# Patient Record
Sex: Male | Born: 1935 | Race: White | Hispanic: No | Marital: Married | Smoking: Never smoker
Health system: Southern US, Community
[De-identification: ages and names within clinical notes are randomized; demographics above are authoritative.]

## PROBLEM LIST (undated history)

## (undated) ENCOUNTER — Emergency Department (HOSPITAL_COMMUNITY): Payer: Medicare Other | Source: Home / Self Care

## (undated) DIAGNOSIS — I10 Essential (primary) hypertension: Secondary | ICD-10-CM

## (undated) DIAGNOSIS — E785 Hyperlipidemia, unspecified: Secondary | ICD-10-CM

## (undated) DIAGNOSIS — M109 Gout, unspecified: Secondary | ICD-10-CM

## (undated) DIAGNOSIS — E039 Hypothyroidism, unspecified: Secondary | ICD-10-CM

## (undated) DIAGNOSIS — I480 Paroxysmal atrial fibrillation: Secondary | ICD-10-CM

## (undated) HISTORY — DX: Hypothyroidism, unspecified: E03.9

## (undated) HISTORY — DX: Hyperlipidemia, unspecified: E78.5

## (undated) HISTORY — DX: Paroxysmal atrial fibrillation: I48.0

## (undated) HISTORY — PX: TONSILLECTOMY AND ADENOIDECTOMY: SUR1326

## (undated) HISTORY — DX: Gout, unspecified: M10.9

## (undated) HISTORY — DX: Essential (primary) hypertension: I10

---

## 1999-08-04 ENCOUNTER — Encounter: Payer: Self-pay | Admitting: Family Medicine

## 1999-08-04 ENCOUNTER — Ambulatory Visit (HOSPITAL_COMMUNITY): Admission: RE | Admit: 1999-08-04 | Discharge: 1999-08-04 | Payer: Self-pay | Admitting: Family Medicine

## 1999-08-10 ENCOUNTER — Encounter: Admission: RE | Admit: 1999-08-10 | Discharge: 1999-10-01 | Payer: Self-pay | Admitting: Family Medicine

## 1999-09-09 ENCOUNTER — Ambulatory Visit (HOSPITAL_COMMUNITY): Admission: RE | Admit: 1999-09-09 | Discharge: 1999-09-09 | Payer: Self-pay | Admitting: Family Medicine

## 1999-09-09 ENCOUNTER — Encounter: Payer: Self-pay | Admitting: Family Medicine

## 1999-09-23 ENCOUNTER — Ambulatory Visit (HOSPITAL_COMMUNITY): Admission: RE | Admit: 1999-09-23 | Discharge: 1999-09-23 | Payer: Self-pay | Admitting: Family Medicine

## 1999-09-23 ENCOUNTER — Encounter: Payer: Self-pay | Admitting: Family Medicine

## 1999-10-07 ENCOUNTER — Encounter: Payer: Self-pay | Admitting: Family Medicine

## 1999-10-07 ENCOUNTER — Ambulatory Visit (HOSPITAL_COMMUNITY): Admission: RE | Admit: 1999-10-07 | Discharge: 1999-10-07 | Payer: Self-pay | Admitting: Family Medicine

## 2000-03-23 ENCOUNTER — Encounter: Payer: Self-pay | Admitting: Internal Medicine

## 2000-03-23 ENCOUNTER — Encounter (INDEPENDENT_AMBULATORY_CARE_PROVIDER_SITE_OTHER): Payer: Self-pay | Admitting: *Deleted

## 2000-03-23 ENCOUNTER — Encounter (INDEPENDENT_AMBULATORY_CARE_PROVIDER_SITE_OTHER): Payer: Self-pay | Admitting: Specialist

## 2000-03-23 ENCOUNTER — Other Ambulatory Visit: Admission: RE | Admit: 2000-03-23 | Discharge: 2000-03-23 | Payer: Self-pay | Admitting: Internal Medicine

## 2001-09-19 ENCOUNTER — Encounter: Payer: Self-pay | Admitting: Family Medicine

## 2001-09-19 ENCOUNTER — Encounter: Admission: RE | Admit: 2001-09-19 | Discharge: 2001-09-19 | Payer: Self-pay | Admitting: Family Medicine

## 2001-10-03 ENCOUNTER — Encounter: Payer: Self-pay | Admitting: Family Medicine

## 2001-10-03 ENCOUNTER — Encounter: Admission: RE | Admit: 2001-10-03 | Discharge: 2001-10-03 | Payer: Self-pay | Admitting: Family Medicine

## 2003-11-26 ENCOUNTER — Ambulatory Visit: Payer: Self-pay | Admitting: Internal Medicine

## 2003-12-10 ENCOUNTER — Ambulatory Visit: Payer: Self-pay | Admitting: Internal Medicine

## 2007-05-30 ENCOUNTER — Emergency Department (HOSPITAL_COMMUNITY): Admission: EM | Admit: 2007-05-30 | Discharge: 2007-05-30 | Payer: Self-pay | Admitting: Emergency Medicine

## 2008-10-25 ENCOUNTER — Encounter (INDEPENDENT_AMBULATORY_CARE_PROVIDER_SITE_OTHER): Payer: Self-pay | Admitting: *Deleted

## 2009-01-21 ENCOUNTER — Encounter (INDEPENDENT_AMBULATORY_CARE_PROVIDER_SITE_OTHER): Payer: Self-pay | Admitting: *Deleted

## 2009-01-22 ENCOUNTER — Ambulatory Visit: Payer: Self-pay | Admitting: Internal Medicine

## 2009-02-10 ENCOUNTER — Ambulatory Visit: Payer: Self-pay | Admitting: Internal Medicine

## 2009-02-11 ENCOUNTER — Encounter: Payer: Self-pay | Admitting: Internal Medicine

## 2009-10-09 ENCOUNTER — Ambulatory Visit: Payer: Self-pay | Admitting: Cardiology

## 2009-10-09 ENCOUNTER — Encounter: Payer: Self-pay | Admitting: Nurse Practitioner

## 2009-10-30 ENCOUNTER — Encounter: Payer: Self-pay | Admitting: Internal Medicine

## 2009-11-13 ENCOUNTER — Encounter: Payer: Self-pay | Admitting: Nurse Practitioner

## 2009-12-11 ENCOUNTER — Encounter: Payer: Self-pay | Admitting: Internal Medicine

## 2009-12-11 ENCOUNTER — Telehealth: Payer: Self-pay | Admitting: Internal Medicine

## 2009-12-12 ENCOUNTER — Ambulatory Visit: Payer: Self-pay | Admitting: Gastroenterology

## 2009-12-12 DIAGNOSIS — R197 Diarrhea, unspecified: Secondary | ICD-10-CM

## 2009-12-12 DIAGNOSIS — Z860100 Personal history of colon polyps, unspecified: Secondary | ICD-10-CM | POA: Insufficient documentation

## 2009-12-12 DIAGNOSIS — I4891 Unspecified atrial fibrillation: Secondary | ICD-10-CM

## 2009-12-16 ENCOUNTER — Encounter: Payer: Self-pay | Admitting: Nurse Practitioner

## 2010-01-08 ENCOUNTER — Ambulatory Visit: Payer: Self-pay | Admitting: Internal Medicine

## 2010-02-10 NOTE — Progress Notes (Signed)
Summary: Triage / diarrhea  Phone Note From Other Clinic   Caller: Germaine @ Dr. Carolynne Edouard [phone] Call For: Dr. Marina Goodell Summary of Call: pt. had COL done and had diarrhea ever since. Requesting pt. be seen before next avail. Initial call taken by: Karna Christmas,  December 11, 2009 10:44 AM  Follow-up for Phone Call        Patient scheduled to see Willette Cluster, RNP tomorrow 12/12/09 at 10am. Appointment scheduled with Germaine at Dr. Billey Chang office. Selinda Michaels RN  December 11, 2009 11:54 AM Follow-up by: Selinda Michaels RN,  December 11, 2009 11:54 AM

## 2010-02-10 NOTE — Letter (Signed)
Summary: Patient Notice- Polyp Results  [facility]  [address] [place], [place] [address]   Phone: [phone]  Fax: [phone]        February 11, 2009 MRN: [id number]    THEODOR MUSTIN [address] [place], [place]  [address]    Dear Mr. Gossen,  I am pleased to inform you that the colon polyp(s) removed during your recent colonoscopy was (were) found to be benign (no cancer detected) upon pathologic examination.  I recommend you have a repeat colonoscopy examination in 5 years to look for recurrent polyps, as having colon polyps increases your risk for having recurrent polyps or even colon cancer in the future.  Should you develop new or worsening symptoms of abdominal pain, bowel habit changes or bleeding from the rectum or bowels, please schedule an evaluation with either your primary care physician or with me.  Additional information/recommendations:  __ No further action with gastroenterology is needed at this time. Please      follow-up with your primary care physician for your other healthcare      needs.  Please call us if you are having persistent problems or have questions about your condition that have not been fully answered at this time.  Sincerely,  Hilarie Fredrickson MD  This letter has been electronically signed by your physician.  Appended Document: Patient Notice- Polyp Results letter mailed 2.3.11

## 2010-02-10 NOTE — Letter (Signed)
Summary: [facility] Instructions  [facility]  [address] [place], [place] [address]   Phone: [phone]  Fax: [phone]       Jerry [name]    April 15, 1931    MRN: [id number]        Procedure Day /Date:  Monday   02/10/09     Arrival Time:  10:00am     Procedure Time:  11:00am     Location of Procedure:                    [facility] _  [facility] (4th Floor)                        PREPARATION FOR COLONOSCOPY WITH MOVIPREP   Starting 5 days prior to your procedure   Wed. 01/26  do not eat nuts, seeds, popcorn, corn, beans, peas,  salads, or any raw vegetables.  Do not take any fiber supplements (e.g. Metamucil, Citrucel, and Benefiber).  THE DAY BEFORE YOUR PROCEDURE         DATE:  01/30   DAY:  Sunday  1.  Drink clear liquids the entire day-NO SOLID FOOD  2.  Do not drink anything colored red or purple.  Avoid juices with pulp.  No orange juice.  3.  Drink at least 64 oz. (8 glasses) of fluid/clear liquids during the day to prevent dehydration and help the prep work efficiently.  CLEAR LIQUIDS INCLUDE: Water Jello Ice Popsicles Tea (sugar ok, no milk/cream) Powdered fruit flavored drinks Coffee (sugar ok, no milk/cream) Gatorade Juice: apple, white grape, white cranberry  Lemonade Clear bullion, consomm, broth Carbonated beverages (any kind) Strained chicken noodle soup Hard Candy                             4.  In the morning, mix first dose of MoviPrep solution:    Empty 1 Pouch A and 1 Pouch B into the disposable container    Add lukewarm drinking water to the top line of the container. Mix to dissolve    Refrigerate (mixed solution should be used within 24 hrs)  5.  Begin drinking the prep at 5:00 p.m. The MoviPrep container is divided by 4 marks.   Every 15 minutes drink the solution down to the next mark (approximately 8 oz) until the full liter is complete.   6.  Follow completed prep with 16 oz of clear liquid of your  choice (Nothing red or purple).  Continue to drink clear liquids until bedtime.  7.  Before going to bed, mix second dose of MoviPrep solution:    Empty 1 Pouch A and 1 Pouch B into the disposable container    Add lukewarm drinking water to the top line of the container. Mix to dissolve    Refrigerate  THE DAY OF YOUR PROCEDURE      DATE:  01/31  DAY:  Monday  Beginning at  6:00 a.m. (5 hours before procedure):         1. Every 15 minutes, drink the solution down to the next mark (approx 8 oz) until the full liter is complete.  2. Follow completed prep with 16 oz. of clear liquid of your choice.    3. You may drink clear liquids until  9:00am  (2 HOURS BEFORE PROCEDURE).   MEDICATION INSTRUCTIONS  Unless otherwise instructed, you should take regular prescription medications  with a small sip of water   as early as possible the morning of your procedure.   Additional medication instructions: Hold Lisinopril the morning of procedure.         OTHER INSTRUCTIONS  You will need a responsible adult at least 75 years of age to accompany you and drive you home.   This person must remain in the waiting room during your procedure.  Wear loose fitting clothing that is easily removed.  Leave jewelry and other valuables at home.  However, you may wish to bring a book to read or  an iPod/MP3 player to listen to music as you wait for your procedure to start.  Remove all body piercing jewelry and leave at home.  Total time from sign-in until discharge is approximately 2-3 hours.  You should go home directly after your procedure and rest.  You can resume normal activities the  day after your procedure.  The day of your procedure you should not:   Drive   Make legal decisions   Operate machinery   Drink alcohol   Return to work  You will receive specific instructions about eating, activities and medications before you leave.    The above instructions have been reviewed  and explained to me by   Wyona Almas RN  January 22, 2009 10:55 AM     I fully understand and can verbalize these instructions _____________________________ Date _________

## 2010-02-10 NOTE — Procedures (Signed)
Summary: Colonoscopy  Patient: Jerry [name] Note: All result statuses are Final unless otherwise noted.  Tests: (1) Colonoscopy (COL)   COL Colonoscopy           DONE     [facility]     [address]     [place], [place]  [address]           COLONOSCOPY PROCEDURE REPORT           PATIENT:  [name], Jerry  MR#:  [id number]     BIRTHDATE:  Nov 12, 1931, 77 yrs. old  GENDER:  male           ENDOSCOPIST:  Wilhemina Bonito. Eda Keys, MD     Referred by:  Surveillance Program Recall,           PROCEDURE DATE:  02/10/2009     PROCEDURE:  Colonoscopy with snare polypectomy x 2     ASA CLASS:  Class II     INDICATIONS:  history of pre-cancerous (adenomatous) colon polyps     (index exam 03-2000 w/ small adenomas; 2005 w/ smallpolyps)           MEDICATIONS:   Fentanyl 50 mcg IV, Versed 5 mg IV           DESCRIPTION OF PROCEDURE:   After the risks benefits and     alternatives of the procedure were thoroughly explained, informed     consent was obtained.  Digital rectal exam was performed and     revealed no abnormalities.   The LB [id number] endoscope     was introduced through the anus and advanced to the cecum, which     was identified by both the appendix and ileocecal valve, without     limitations. Time to cecum = 6:04 min.The quality of the prep was     good, using MoviPrep.  The instrument was then slowly withdrawn     (time = 18:45min) as the colon was fully examined.     <<PROCEDUREIMAGES>>           FINDINGS:  Two polyps, 3mm and 7mm, were found in the ascending     colon. Polyps were snared without cautery. Retrieval was     successful.   Severe diverticulosis was found in the left colon.     Retroflexed views in the rectum revealed internal hemorrhoids.     The scope was then withdrawn from the patient and the procedure     completed.           COMPLICATIONS:  None           ENDOSCOPIC IMPRESSION:     1) Two polyps in the ascending colon -Removed     2)  Severe diverticulosis in the left colon     3) Internal hemorrhoids     RECOMMENDATIONS:     1) Follow up colonoscopy in 5 years if medically fit           ______________________________     Wilhemina Bonito. Eda Keys, MD           CC:  Rodrigo Ran, MD; The Patient           n.     eSIGNED:   Wilhemina Bonito. Eda Keys at 02/10/2009 12:05 PM           Arma Heading, [id number]  Note: An exclamation mark (!) indicates a result that was not dispersed into the flowsheet. Document Creation Date: 02/10/2009  12:06 PM _______________________________________________________________________  (1) Order result status: Final Collection or observation date-time: 02/10/2009 11:58 Requested date-time:  Receipt date-time:  Reported date-time:  Referring Physician:   Ordering Physician: Fransico Setters [phone]) Specimen Source:  Source: Launa Grill Order Number: [id number] Lab site:   Appended Document: Colonoscopy recall     Procedures Next Due Date:    Colonoscopy: 01/2014

## 2010-02-10 NOTE — Letter (Signed)
Summary: [facility]   Imported By: Lennie Odor 11/20/2009 11:18:36  _____________________________________________________________________  External Attachment:    Type:   Image     Comment:   External Document

## 2010-02-10 NOTE — Letter (Signed)
Summary: [facility]   Imported By: Lennie Odor 12/17/2009 14:02:01  _____________________________________________________________________  External Attachment:    Type:   Image     Comment:   External Document

## 2010-02-10 NOTE — Miscellaneous (Signed)
Summary: [facility] Previsit/prep  Clinical Lists Changes  Medications: Added new medication of MOVIPREP 100 GM  SOLR (PEG-KCL-NACL-NASULF-NA ASC-C) As per prep instructions. - Signed Rx of MOVIPREP 100 GM  SOLR (PEG-KCL-NACL-NASULF-NA ASC-C) As per prep instructions.;  #1 x 0;  Signed;  Entered by: Wyona Almas RN;  Authorized by: Hilarie Fredrickson MD;  Method used: Electronically to [facility]. #[id number]*, [address] [place], [place], [place]  [address], Ph: [phone], Fax: [phone] Observations: Added new observation of NKA: T (01/22/2009 10:18)    Prescriptions: MOVIPREP 100 GM  SOLR (PEG-KCL-NACL-NASULF-NA ASC-C) As per prep instructions.  #1 x 0   Entered by:   Wyona Almas RN   Authorized by:   Hilarie Fredrickson MD   Signed by:   Wyona Almas RN on 01/22/2009   Method used:   Electronically to        [facility]. [phone]* (retail)       [phone]       [place], [place]  [address]       Ph: [phone]       Fax: [phone]   RxID:   [id number]

## 2010-02-12 NOTE — Procedures (Signed)
Summary: Colonoscopy   Colonoscopy  Procedure date:  03/23/2000  Findings:      Location:  [facility].  Results: Hemorrhoids.     Results: Diverticulosis.       Results: Polyp.  Tubular Adenoma  Patient Name: Jerry [name], Jerry [name] MRN:  Procedure Procedures: Colonoscopy CPT: 25956.    with polypectomy. CPT: A3573898.  Personnel: Endoscopist: Wilhemina Bonito. Marina Goodell, MD.  Referred By: Rodrigo Ran, MD.  Exam Location: Exam performed in Outpatient Clinic. Outpatient  Patient Consent: Procedure, Alternatives, Risks and Benefits discussed, consent obtained, from patient.  Indications  Average Risk Screening Routine.  History  Pre-Exam Physical: Performed Mar 23, 2000. Cardio-pulmonary exam, Rectal exam, HEENT exam , Abdominal exam, Extremity exam, Neurological exam, Mental status exam WNL.  Exam Exam: Extent of exam reached: Cecum, extent intended: Cecum.  The cecum was identified by appendiceal orifice and IC valve. Patient position: left side to back. Colon retroflexion performed. Images taken. ASA Classification: I. Tolerance: excellent.  Monitoring: Pulse and BP monitoring, Oximetry used. Supplemental O2 given.  Colon Prep Used Golytely for colon prep. Prep results: excellent.  Sedation Meds: Fentanyl 50 mcg. Versed 5 mg.  Findings NORMAL EXAM: Transverse Colon.  - DIVERTICULOSIS: Descending Colon to Sigmoid Colon. ICD9: Diverticulosis, Colon: 562.10.  NORMAL EXAM: Cecum.  POLYP: Sigmoid Colon, Maximum size: 5 mm. sessile polyp. Distance from Anus 30 cm. Procedure:  snare with cautery, removed, retrieved, Polyp sent to pathology. ICD9: Colon Polyps: 211.3.  HEMORRHOIDS: Internal. ICD9: Hemorrhoids, Internal: 455.0.   Assessment Abnormal examination, see findings above.  Diagnoses: 562.10: Diverticulosis, Colon.  211.3: Colon Polyps.  455.0: Hemorrhoids, Internal.   Events  Unplanned Interventions: No intervention was required.  Unplanned Events: There  were no complications. Plans  Post Exam Instructions: No aspirin or non-steroidal containing medications: 2 weeks.  Patient Education: Patient given standard instructions for: Polyps. Diverticulosis.  Disposition: After procedure patient sent to recovery. After recovery patient sent home.  Scheduling/Referral: Await pathology to schedule patient. Colonoscopy, to Wilhemina Bonito. Marina Goodell, MD, in 3 years if polyp adenomatous.,    This report was created from the original endoscopy report, which was reviewed and signed by the above listed endoscopist.   cc:  Rodrigo Ran, MD

## 2010-02-12 NOTE — Assessment & Plan Note (Signed)
Summary: Diarrhea/LRH   History of Present Illness Visit Type: Initial Consult Primary GI MD: Yancey Flemings MD Primary Provider: Rodrigo Ran, MD Requesting Provider: Chevis Pretty, MD Chief Complaint: Since Colonoscopy pt has had soft and sometimes watery loose diarrhea with BRB rectal bleeding. Pt does have some gas and fecal incontinence since Colonoscopy. Pt states his rectum is very irratated and painful at times after BM's. History of Present Illness:   Patient followed by Dr. Marina Goodell for history of colon polyps. He is here for evaluation of diarrhea. Prior to Jan. 2011 surveillance colonoscopy patient had 1-2 formed BMs a day. Now has 3-4 loose stools a day, some of which are post-prandial. Some incontinence with flatus. Stools not malodorous. No associated abdominal pain. At times he has scant bleeding with BMs and attributes that to anal excoration. Patient saw Dr. Carolynne Edouard and Dr. Ezzard Standing for rectal bleeding / pruritis ani. Vaseline seems to help. Patient started Fiber tablets last month. Stools not quite as liquid now.   GI Review of Systems    Reports belching and  bloating.      Denies abdominal pain, acid reflux, chest pain, dysphagia with liquids, dysphagia with solids, heartburn, loss of appetite, nausea, vomiting, vomiting blood, weight loss, and  weight gain.      Reports change in bowel habits, diarrhea, rectal bleeding, and  rectal pain.     Denies anal fissure, black tarry stools, constipation, diverticulosis, fecal incontinence, heme positive stool, hemorrhoids, irritable bowel syndrome, jaundice, light color stool, and  liver problems. Preventive Screening-Counseling & Management  Alcohol-Tobacco     Smoking Status: quit  Caffeine-Diet-Exercise     Does Patient Exercise: yes      Drug Use:  no.      Current Medications (verified): 1)  Digoxin 0.25 Mg Tabs (Digoxin) .... One Tablet By Mouth Once Daily 2)  Lipitor 20 Mg Tabs (Atorvastatin Calcium) .... One Tablet By Mouth Once  Daily 3)  Levoxyl 100 Mcg Tabs (Levothyroxine Sodium) .... One Tablet By Mouth Once Daily 4)  Allopurinol 300 Mg Tabs (Allopurinol) .... One Tablet By Mouth Once Daily 5)  Multivitamins   Tabs (Multiple Vitamin) .... One Tablet By Mouth Once Daily 6)  Lovaza 1 Gm Caps (Omega-3-Acid Ethyl Esters) .... One Capsule By Mouth Once Daily 7)  Fenofibrate Micronized 200 Mg Caps (Fenofibrate Micronized) .... One Tablet By Mouth Once Daily 8)  Lisinopril-Hydrochlorothiazide 10-12.5 Mg Tabs (Lisinopril-Hydrochlorothiazide) .... One Tablet By Mouth Once Daily 9)  Vitamin D 2000 Unit Tabs (Cholecalciferol) .... One Tablet By Mouth Once Daily 10)  Alendronate Sodium 70 Mg Tabs (Alendronate Sodium) .... One Tablet By Mouth Once Daily 11)  Pradaxa 150 Mg Caps (Dabigatran Etexilate Mesylate) .... One Capsule By Mouth Once Daily  Allergies (verified): No Known Drug Allergies  Past History:  Past Medical History: Diverticulosis Adenomatous Colon Polyps 01/2009 Internal Hemorrhoids Arrhythmia Hypertension Hyperlipidemia Hypothyroidism Gout  Past Surgical History: Unremarkable  Family History: Lung Cancer:  father Stroke: Mother Family History of Diabetes: Brother Family History of Heart Disease: Brother No FH of Colon Cancer:  Social History: Married Patient is a former smoker.  Alcohol Use - yes- ocassionally Daily Caffeine Use Illicit Drug Use - no Patient gets regular exercise. Smoking Status:  quit Drug Use:  no Does Patient Exercise:  yes  Vital Signs:  Patient profile:   77 year old male Height:      73 inches Weight:      189 pounds BMI:     25.03 Pulse  rate:   78 / minute Pulse rhythm:   regular BP sitting:   116 / 76  (left arm) Cuff size:   regular  Vitals Entered By: Christie Nottingham CMA Duncan Dull) (December 12, 2009 9:43 AM)  Physical Exam  General:  Well developed, well nourished, no acute distress. Head:  Normocephalic and atraumatic. Mouth:  No oral lesions.  Tongue moist.  Neck:  no obvious masses  Lungs:  Clear throughout to auscultation. Heart:  Occasional irregular beat. Abdomen:  Abdomen soft, nontender, nondistended. No obvious masses or hepatomegaly.Normal bowel sounds.  Rectal:  Several excoriations of anal folds. External and internal exam otherwise negative.  Msk:  Symmetrical with no gross deformities. Normal posture. Neurologic:  Alert and  oriented x4;  grossly normal neurologically. Skin:  Intact without significant lesions or rashes. Cervical Nodes:  No significant cervical adenopathy. Psych:  Alert and cooperative. Normal mood and affect.   Impression & Recommendations:  Problem # 1:  DIARRHEA (ICD-787.91) Assessment New Loose, frequent stools since colonoscopy in Jan. 2011. I don't think the two are related in any way. Labs 10/09/09 reveal normal WBC, Hgb, normal TSH, normal renal and liver unction. Since colonoscopy he did start three new medications including Digoxin, Fenofibrate and Pradaxa but I don't believe diarrhea is a prominent side effect of any of those. He has slightly improved with Fiber. Will check stool for C-Diff PCR, C&S, Lactoferrin. Patient's abdominal examination is benign. May take Immodium twice daily. If stool studies are negative and symptoms persist will  treat empirically with course of Flagyl. If that proves ineffective then further workup may include celiac panel, random colon biopsies ect...  Orders: T-C diff by PCR ([id number]) T-Culture, Stool ([id number]) T-Fecal WBC ([id number])  Problem # 2:  PERSONAL HX COLONIC POLYPS (ICD-V12.72) Assessment: Comment Only  Problem # 3:  ATRIAL FIBRILLATION (ICD-427.31) Assessment: Comment Only  Patient Instructions: 1)  Please go to lab, basement level. 2)  Take Immodium 2-3 times daily. 3)  We made you a follow up appointment with Dr. Marina Goodell for 01-08-2010.  Appointment card provided. 4)  Copy sent to :  Dr. Rodrigo Ran 5)  The medication list was  reviewed and reconciled.  All changed / newly prescribed medications were explained.  A complete medication list was provided to the patient / caregiver.  Appended Document: Diarrhea/LRH Stool studies are negative. Pam, please see how he is doing. Keep follow up with Dr. Marina Goodell  Appended Document: Diarrhea/LRH The said he is feeling great and he did take some Imodium and it has helped.  He wanted [place] to know Gunnar Fusi is one of his favorites here at [facility].  He knows he has a followup appt with Dr Marina Goodell on 01-08-10 and asked if it is necessary he keep that since he is doing well.  I told him I would ask Gunnar Fusi and get back to him.  I called Carolann Brazell's voice mail and asked her to call me.   Appended Document: Diarrhea/LRH If he is feeling okay then no need to follow up. Call if needed. He is up to date on colonoscopy.

## 2010-02-12 NOTE — Letter (Signed)
Summary: [facility]   Imported By: Lester Burket 12/26/2009 12:35:06  _____________________________________________________________________  External Attachment:    Type:   Image     Comment:   External Document

## 2010-02-12 NOTE — Assessment & Plan Note (Signed)
Summary: F/U Diarrhea (saw Lafe Garin ACNP 12-12-09)   History of Present Illness Visit Type: Follow-up Visit Primary GI MD: Yancey Flemings MD Primary Provider: Rodrigo Ran, MD Requesting Provider: Chevis Pretty, MD Chief Complaint: diarrhea, has subsided with regimen of Imodium History of Present Illness:   77 year old with hypertension, hyperlipidemia, hypothyroidism, atrial arrhythmia which he is on Pradaxa, and adenomatous colon polyps. He presents today for followup regarding problems with change in bowel habits in the form of diarrhea. His last colonoscopy was performed January 2011. Thereafter he reports a change in bowel habits with increased frequency, urgency, and a tendency toward loose stools. Also problems with gas and perirectal irritation due to fecal soilage. He was seen 4 weeks ago in this office. Stool for Clostridium difficile was negative as was stools for enteric pathogens. Cecal lactoferrin was positive. He was treated with Imodium. He presents today for followup. Shortly after initiating Imodium, he reports prompt resolution of his problem. Actually had a day or 2 of constipation. Perirectal irritation has resolved. No abdominal or other complaints.   GI Review of Systems      Denies abdominal pain, acid reflux, belching, bloating, chest pain, dysphagia with liquids, dysphagia with solids, heartburn, loss of appetite, nausea, vomiting, vomiting blood, weight loss, and  weight gain.      Reports diarrhea.     Denies anal fissure, black tarry stools, change in bowel habit, constipation, diverticulosis, fecal incontinence, heme positive stool, hemorrhoids, irritable bowel syndrome, jaundice, light color stool, liver problems, rectal bleeding, and  rectal pain.    Current Medications (verified): 1)  Digoxin 0.25 Mg Tabs (Digoxin) .... One Tablet By Mouth Once Daily 2)  Lipitor 20 Mg Tabs (Atorvastatin Calcium) .... One Tablet By Mouth Once Daily 3)  Levoxyl 100 Mcg Tabs  (Levothyroxine Sodium) .... One Tablet By Mouth Once Daily 4)  Allopurinol 300 Mg Tabs (Allopurinol) .... One Tablet By Mouth Once Daily 5)  Multivitamins   Tabs (Multiple Vitamin) .... One Tablet By Mouth Once Daily 6)  Lovaza 1 Gm Caps (Omega-3-Acid Ethyl Esters) .... One Capsule By Mouth Once Daily 7)  Fenofibrate Micronized 200 Mg Caps (Fenofibrate Micronized) .... One Tablet By Mouth Once Daily 8)  Lisinopril-Hydrochlorothiazide 10-12.5 Mg Tabs (Lisinopril-Hydrochlorothiazide) .... One Tablet By Mouth Once Daily 9)  Vitamin D 2000 Unit Tabs (Cholecalciferol) .... One Tablet By Mouth Once Daily 10)  Alendronate Sodium 70 Mg Tabs (Alendronate Sodium) .... One Tablet By Mouth Once Daily 11)  Pradaxa 150 Mg Caps (Dabigatran Etexilate Mesylate) .... One Capsule By Mouth Two Times A Day  Allergies (verified): No Known Drug Allergies  Past History:  Past Medical History: Reviewed history from 12/12/2009 and no changes required. Diverticulosis Adenomatous Colon Polyps 01/2009 Internal Hemorrhoids Arrhythmia Hypertension Hyperlipidemia Hypothyroidism Gout  Past Surgical History: Tonsillectomy  Family History: Reviewed history from 12/12/2009 and no changes required. Lung Cancer:  father Stroke: Mother Family History of Diabetes: Brother Family History of Heart Disease: Brother No FH of Colon Cancer:  Social History: Reviewed history from 12/12/2009 and no changes required. Married Patient is a former smoker.  Alcohol Use - yes- ocassionally Daily Caffeine Use Illicit Drug Use - no Patient gets regular exercise.  Review of Systems       The patient complains of heart rhythm changes.  The patient denies allergy/sinus, anemia, anxiety-new, arthritis/joint pain, back pain, blood in urine, breast changes/lumps, change in vision, confusion, cough, coughing up blood, depression-new, fainting, fatigue, fever, headaches-new, hearing problems, heart murmur, itching, menstrual pain,  muscle pains/cramps, night sweats, nosebleeds, pregnancy symptoms, shortness of breath, skin rash, sleeping problems, sore throat, swelling of feet/legs, swollen lymph glands, thirst - excessive , urination - excessive , urination changes/pain, urine leakage, vision changes, and voice change.    Vital Signs:  Patient profile:   77 year old male Height:      73 inches Weight:      189 pounds BMI:     25.03 Pulse rate:   64 / minute Pulse rhythm:   regular BP sitting:   138 / 70  (left arm) Cuff size:   regular  Vitals Entered By: June McMurray CMA Duncan Dull) (January 08, 2010 9:41 AM)  Physical Exam  General:  Well developed, well nourished, no acute distress. Head:  Normocephalic and atraumatic. Eyes:  PERRLA, no icterus. Mouth:  No deformity or lesions Lungs:  Clear throughout to auscultation. Heart:  Regular rate and rhythm; no murmurs, rubs,  or bruits. Abdomen:  Soft, nontender and nondistended. No masses, hepatosplenomegaly or hernias noted. Normal bowel sounds. Pulses:  Normal pulses noted. Neurologic:  Alert and  oriented x4 Psych:  Alert and cooperative. Normal mood and affect.   Impression & Recommendations:  Problem # 1:  DIARRHEA (ICD-787.91) excellent response to Imodium. He should continue on Imodium. We discussed p.r.n. use and titrating as needed. GI followup p.r.n. She will contact the office for any questions or problems.  Problem # 2:  PERSONAL HX COLONIC POLYPS (ICD-V12.72) routine followup surveillance planned for January 2016 if medically fit  Patient Instructions: 1)  Please continue current medications.  2)  Please schedule a follow-up appointment as needed.  3)  Copy sent to : Rodrigo Ran, MD 4)  The medication list was reviewed and reconciled.  All changed / newly prescribed medications were explained.  A complete medication list was provided to the patient / caregiver.

## 2010-02-12 NOTE — Procedures (Signed)
Summary: colonoscopy   Colonoscopy  Procedure date:  12/10/2003  Findings:      Results: Polyp.  Not retrieved (tiny) Results: Hemorrhoids.     Results: Diverticulosis.       Location:  [facility].    Comments:      Repeat colonoscopy in 5 years.   Procedures Next Due Date:    Colonoscopy: 12/2008 Patient Name: Jerry [name], Jerry [name] MRN:  Procedure Procedures: Colonoscopy CPT: 81191.    with polypectomy. CPT: A3573898.  Personnel: Endoscopist: Wilhemina Bonito. Marina Goodell, MD.  Exam Location: Exam performed in Outpatient Clinic. Outpatient  Patient Consent: Procedure, Alternatives, Risks and Benefits discussed, consent obtained, from patient. Consent was obtained by the RN.  Indications  Surveillance of: Adenomatous Polyp(s). This is an initial surveillance exam. Initial polypectomy was performed in 2002. in Mar. 1-2 Polyps were found at Index Exam. Largest polyp removed was 6 to 9 mm. Prior polyp located in distal colon. Pathology of worst  polyp: tubular adenoma.  History  Current Medications: Patient is not currently taking Coumadin.  Pre-Exam Physical: Performed Dec 10, 2003. Entire physical exam was normal.  Exam Exam: Extent of exam reached: Cecum, extent intended: Cecum.  The cecum was identified by appendiceal orifice and IC valve. Patient position: on left side. Colon retroflexion performed. Images taken. ASA Classification: II. Tolerance: excellent.  Monitoring: Pulse and BP monitoring, Oximetry used. Supplemental O2 given.  Colon Prep Used Miralax for colon prep. Prep results: excellent.  Sedation Meds: Patient assessed and found to be appropriate for moderate (conscious) sedation. Fentanyl 75 mcg. given IV. Versed 8 mg. given IV.  Findings POLYP: Transverse Colon, Maximum size: 3 mm. sessile polyp. Procedure:  snare without cautery, removed, not retrieved, ICD9: Colon Polyps: 211.3. Comments: 2 tiny polyps seen and removed. notissue available for  pathologic submission.  NORMAL EXAM: Cecum to Rectum. Comments: internal hemorrhoids.  - DIVERTICULOSIS: Ascending Colon to Sigmoid Colon. ICD9: Diverticulosis, Colon: 562.10. Comments: marked changes.   Assessment  Diagnoses: 562.10: Diverticulosis, Colon.  211.3: Colon Polyps.  455.0: Hemorrhoids, Internal.   Events  Unplanned Interventions: No intervention was required.  Unplanned Events: There were no complications. Plans Disposition: After procedure patient sent to recovery. After recovery patient sent home.  Scheduling/Referral: Colonoscopy, to Wilhemina Bonito. Marina Goodell, MD, in 5 years,   Comments: Return to the care of Dr. Waynard Edwards  This report was created from the original endoscopy report, which was reviewed and signed by the above listed endoscopist.   cc:  Rodrigo Ran, MD      The Patient

## 2010-03-20 ENCOUNTER — Encounter: Payer: Self-pay | Admitting: Cardiology

## 2010-03-20 DIAGNOSIS — I10 Essential (primary) hypertension: Secondary | ICD-10-CM | POA: Insufficient documentation

## 2010-03-20 DIAGNOSIS — I480 Paroxysmal atrial fibrillation: Secondary | ICD-10-CM | POA: Insufficient documentation

## 2010-03-20 DIAGNOSIS — E039 Hypothyroidism, unspecified: Secondary | ICD-10-CM | POA: Insufficient documentation

## 2010-03-20 DIAGNOSIS — E785 Hyperlipidemia, unspecified: Secondary | ICD-10-CM | POA: Insufficient documentation

## 2010-04-10 ENCOUNTER — Telehealth: Payer: Self-pay | Admitting: Cardiology

## 2010-04-10 ENCOUNTER — Ambulatory Visit: Payer: Self-pay | Admitting: Cardiology

## 2010-04-10 NOTE — Telephone Encounter (Signed)
PT SAID DR PERINI IS GOING TO BE FOLLOWING HIM FOR MOST THINGS AND WANTS TO TALK TO DR [place]. PLACED CHART IN BOX.

## 2010-04-15 ENCOUNTER — Telehealth: Payer: Self-pay | Admitting: *Deleted

## 2010-04-15 NOTE — Telephone Encounter (Signed)
Have left several messages w/ no return call

## 2010-04-16 ENCOUNTER — Ambulatory Visit: Payer: Self-pay | Admitting: Nurse Practitioner

## 2010-05-26 NOTE — H&P (Signed)
NAME:  Jerry [name], Jerry [name] NO.:  [id number]   MEDICAL RECORD NO.:  [id number]          PATIENT TYPE:  EMS   LOCATION:  ED                           FACILITY:  [facility]   PHYSICIAN:  Peter M. [place], M.D.  DATE OF BIRTH:  June 28, 1931   DATE OF ADMISSION:  05/30/2007  DATE OF DISCHARGE:                              HISTORY & PHYSICAL   HISTORY OF PRESENT ILLNESS:  Jerry [name] is a 77 year old white male,  well-known to me.  He has a history of chronic PVCs, bradycardia, and  paroxysmal atrial fibrillation/flutter.  He has been on therapy with  Lanoxin.  The patient awoke at 3:00 a.m. this morning with a rapid  heartbeat.  He denies any other symptoms except belching.  He had no  chest pain, shortness of breath, nausea, vomiting, dizziness or syncope.  He came to the emergency department, where monitor showed atrial  fibrillation with a rate up to 128 beats per minute.  He was given IV  Cardizem and subsequently broke to normal sinus rhythm with sinus  bradycardia at a  rate of 55 beats per minute.  The patient currently  feels fine.  His prior cardiac evaluation includes a normal stress  Cardiolite study in April 2003.  He had an echocardiogram in October  2006, which showed mild LVH with normal systolic function.  He had  mitral annular calcification and mild tricuspid insufficiency.  He has  had monitors in the past that have shown frequent PVCs.  His most recent  evaluation  in August 2008 with an event monitor showed episodes of  atrial flutter with a rapid ventricular response up to rate of 107.  It  is noteworthy that the patient had been tried on Cardizem in the past  but developed a macular rash.  He had also been started on beta blockers  but was intolerant due to marked bradycardia.  He has tolerated Lanoxin  well.  The patient does have a chronic abnormal EKG showing diffuse ST/T  wave depression.   PAST MEDICAL HISTORY:  1. Hypertension.  2. PVCs.  3.  Paroxysmal atrial fibrillation/flutter.  4. Hypercholesterolemia.  5. Hypothyroidism.  6. Status post T&A.   ALLERGIES:  HE IS ALLERGIC TO CARDIZEM, WHICH CAUSES A RASH.   MEDICATIONS:  Include Lipitor 40 mg per day, Levoxyl 100 mcg per day,  allopurinol 300 mg per day, lisinopril HCT 10/12.5 mg daily, aspirin 81  mg per day, Linaza daily, and Lanoxin 0.25 mg daily.   SOCIAL HISTORY:  The patient is a retired Production designer, theatre/television/film for [employer].  He denies smoking or alcohol use.  He has one son.  He is married.  He  has one daughter who has passed away due to lung disease.   FAMILY HISTORY:  Father died at age 93 with a stroke.  Mother died at  age 29 with lung cancer.  He has one brother who has diabetes.   REVIEW OF SYSTEMS:  Otherwise unremarkable.  The patient has remained  active.  He denies any increased edema, orthopnea, PND.  He has had no  history of  stroke or TIA.  Denies any bleeding difficulties.  Other  review of systems are negative.   PHYSICAL EXAMINATION:  GENERAL:  The patient is a well-developed white  male in no distress.  VITAL SIGNS:  Pulse is 55, in sinus rhythm, blood pressure 136/63,  respirations were normal.  Sats were 98% on room air.  HEENT:  He wears glasses.  His pupils are equal, round, reactive to  light and accommodation.  Extraocular movements are intact.  Oropharynx  is clear.  NECK:  Supple without JVD, adenopathy, thyromegaly or bruits.  LUNGS:  Clear to auscultation and percussion.  CARDIAC:  Reveals a regular rate and rhythm.  Normal S1 and S2 without  gallop, murmur, rub or click.  ABDOMEN:  Soft, nontender without hepatosplenomegaly, masses or bruits.  EXTREMITIES:  Femoral and pedal pulses were 2+ and symmetric.  NEUROLOGIC:  Exam is nonfocal.   LABORATORY DATA:  Sodium was 142, potassium 3.6, chloride 107, CO2 24,  BUN 16, creatinine 1.1, glucose 130, hemoglobin 13.6.  DIG level was  1.4.  Initial CK-MB was 5.8 and then 4.3.  Troponin was  0.23 and then  less than 0.05.  Initial ECG showed atrial fibrillation, rate of 101  beats per minute.  There was QRS widening and diffuse ST/T wave changes.  Subsequent ECG showed a sinus bradycardia with first-degree AV block and  diffuse anterolateral ST/T wave depression.   IMPRESSION:  1. Paroxysmal atrial fibrillation, now converted to sinus rhythm.  2. Chronically abnormal electrocardiogram.  3. One of two point-of-care troponins was positive but normal CPK-MB.      Given the fact that the second troponin was actually normal I      believe that his first level was spurious, and this does not fit a      pattern for acute cardiac event.  4. Hypertension.  5. Hypothyroidism.  6. Hypercholesterolemia.   PLAN:  We will discharge the patient home today on his continued same  medications.  We will arrange a stress Cardiolite study and  echocardiogram in our office this week.  If these studies are  unremarkable and the patient does not require cardiac catheterization  will recommend that he be started on Coumadin for reduction risk of  thromboembolic stroke.  We will continue on his digoxin at this time.  Additional rate control has resulted in severe bradycardia in the past  that was symptomatic.  I think if he requires any additional medication  for either rate control or antiarrhythmic therapy, he would probably  require a pacemaker.           ______________________________  Peter M. [place], M.D.     PMJ/MEDQ  D:  05/30/2007  T:  05/30/2007  Job:  [id number]   cc:   Loraine Leriche A. Perini, M.D.  Fax: [phone]

## 2010-06-19 ENCOUNTER — Encounter: Payer: Self-pay | Admitting: Cardiology

## 2010-06-22 ENCOUNTER — Encounter: Payer: Self-pay | Admitting: Cardiology

## 2010-06-30 ENCOUNTER — Other Ambulatory Visit: Payer: Self-pay | Admitting: Cardiology

## 2010-06-30 MED ORDER — DABIGATRAN ETEXILATE MESYLATE 150 MG PO CAPS: 150.0000 mg | ORAL_CAPSULE | Freq: Two times a day (BID) | ORAL | Status: DC

## 2010-06-30 NOTE — Telephone Encounter (Signed)
Med refill

## 2010-07-03 ENCOUNTER — Other Ambulatory Visit: Payer: Self-pay | Admitting: *Deleted

## 2010-07-03 MED ORDER — DABIGATRAN ETEXILATE MESYLATE 150 MG PO CAPS: 150.0000 mg | ORAL_CAPSULE | Freq: Two times a day (BID) | ORAL | Status: DC

## 2010-07-03 NOTE — Telephone Encounter (Signed)
escribe medication per fax request  

## 2010-10-07 LAB — POCT CARDIAC MARKERS
CKMB, poc: 4.3
CKMB, poc: 5.8
Operator id: 264421
Troponin i, poc: 0.23 — ABNORMAL HIGH
Troponin i, poc: <0.05

## 2010-10-07 LAB — POCT I-STAT, CHEM 8
Calcium, Ion: 1.13
Chloride: 107
Creatinine, Ser: 1.1
Glucose, Bld: 130 — ABNORMAL HIGH
Potassium: 3.6

## 2011-08-27 ENCOUNTER — Other Ambulatory Visit (HOSPITAL_COMMUNITY): Payer: Self-pay | Admitting: Internal Medicine

## 2011-08-27 ENCOUNTER — Ambulatory Visit (HOSPITAL_COMMUNITY)
Admission: RE | Admit: 2011-08-27 | Discharge: 2011-08-27 | Disposition: A | Discharge status: Home / Self Care | Payer: Medicare Other | Source: Ambulatory Visit | Attending: Internal Medicine | Admitting: Internal Medicine

## 2011-08-27 ENCOUNTER — Other Ambulatory Visit (HOSPITAL_COMMUNITY): Payer: Self-pay

## 2011-08-27 DIAGNOSIS — R109 Unspecified abdominal pain: Secondary | ICD-10-CM | POA: Insufficient documentation

## 2011-08-27 DIAGNOSIS — N200 Calculus of kidney: Secondary | ICD-10-CM

## 2011-08-27 DIAGNOSIS — I059 Rheumatic mitral valve disease, unspecified: Secondary | ICD-10-CM | POA: Insufficient documentation

## 2011-08-27 DIAGNOSIS — I723 Aneurysm of iliac artery: Secondary | ICD-10-CM | POA: Insufficient documentation

## 2011-08-27 DIAGNOSIS — N210 Calculus in bladder: Secondary | ICD-10-CM | POA: Insufficient documentation

## 2011-08-27 DIAGNOSIS — N400 Enlarged prostate without lower urinary tract symptoms: Secondary | ICD-10-CM | POA: Insufficient documentation

## 2011-08-27 DIAGNOSIS — I700 Atherosclerosis of aorta: Secondary | ICD-10-CM | POA: Insufficient documentation

## 2011-08-27 DIAGNOSIS — K5730 Diverticulosis of large intestine without perforation or abscess without bleeding: Secondary | ICD-10-CM | POA: Insufficient documentation

## 2011-08-27 DIAGNOSIS — R52 Pain, unspecified: Secondary | ICD-10-CM

## 2011-09-02 ENCOUNTER — Ambulatory Visit (INDEPENDENT_AMBULATORY_CARE_PROVIDER_SITE_OTHER): Payer: Medicare Other | Admitting: Cardiology

## 2011-09-02 ENCOUNTER — Encounter: Payer: Self-pay | Admitting: Cardiology

## 2011-09-02 VITALS — BP 134/58 | HR 105 | Ht 73.0 in | Wt 187.8 lb

## 2011-09-02 DIAGNOSIS — I10 Essential (primary) hypertension: Secondary | ICD-10-CM

## 2011-09-02 DIAGNOSIS — I4891 Unspecified atrial fibrillation: Secondary | ICD-10-CM

## 2011-09-02 NOTE — Patient Instructions (Signed)
Continue your current medication.  I will see you again in 1 year.  

## 2011-09-02 NOTE — Progress Notes (Signed)
Jerry [name] Date of Birth: 04-13-1931 Medical Record #[id number]  History of Present Illness: Jerry [name] is seen today for followup. He was last seen 2 years ago. He has a history of paroxysmal atrial fibrillation. He has had some evidence of sick sinus syndrome with post conversion bradycardia and pauses. He has never been symptomatic. He has been managed with digoxin only. He has been on chronic anticoagulation with Pradaxa. He reports that he is doing very well. He is really not aware that his heart is out of rhythm. He denies any dizziness, syncope, palpitations, or fatigue. He has no shortness of breath or chest pain. He remains very.  Current Outpatient Prescriptions on File Prior to Visit  Medication Sig Dispense Refill  . allopurinol (ZYLOPRIM) 300 MG tablet Take 300 mg by mouth daily.        Marland Kitchen atorvastatin (LIPITOR) 40 MG tablet Take 40 mg by mouth daily.        . dabigatran (PRADAXA) 150 MG CAPS Take 1 capsule (150 mg total) by mouth every 12 (twelve) hours.  60 capsule  5  . digoxin (LANOXIN) 0.25 MG tablet Take 250 mcg by mouth daily.        . fenofibrate micronized (LOFIBRA) 200 MG capsule Take 200 mg by mouth daily.        Marland Kitchen levothyroxine (SYNTHROID, LEVOTHROID) 100 MCG tablet Take 100 mcg by mouth daily.        Marland Kitchen lisinopril-hydrochlorothiazide (PRINZIDE,ZESTORETIC) 10-12.5 MG per tablet Take 1 tablet by mouth daily.        . Multiple Vitamin (MULTIVITAMIN) tablet Take 1 tablet by mouth daily.        Marland Kitchen omega-3 acid ethyl esters (LOVAZA) 1 G capsule Take 4 g by mouth daily.        . Sildenafil Citrate (VIAGRA PO) Take by mouth as needed.          Allergies  Allergen Reactions  . Cardizem (Diltiazem Hcl) Rash    Past Medical History  Diagnosis Date  . Paroxysmal atrial fibrillation   . Dyslipidemia   . HTN (hypertension)   . Hypothyroidism   . Gout     Past Surgical History  Procedure Date  . Tonsillectomy and adenoidectomy AGE 22    History  Smoking status   . Never Smoker   Smokeless tobacco  . Not on file    History  Alcohol Use No    Family History  Problem Relation Age of Onset  . Stroke Father   . Hypertension Father   . Other Mother     CARDIOMEGALY  . Hypertension Mother   . Lung cancer Mother   . Diabetes Brother   . COPD Child     Review of Systems: As noted in history of present illness..  All other systems were reviewed and are negative.  Physical Exam: BP 134/58  Pulse 105  Ht 6\' 1"  (1.854 m)  Wt 187 lb 12.8 oz (85.186 kg)  BMI 24.78 kg/m2  he is a pleasant white male in no acute distress.The patient is alert and oriented x 3.  The mood and affect are normal.  The skin is warm and dry.  Color is normal.  The HEENT exam reveals that the sclera are nonicteric.  The mucous membranes are moist.  The carotids are 2+ without bruits.  There is no thyromegaly.  There is no JVD.  The lungs are clear.  The chest wall is non tender.  The heart exam reveals  a regular rate with a normal S1 and S2.  There are no murmurs, gallops, or rubs.  The PMI is not displaced.   Abdominal exam reveals good bowel sounds.  There is no guarding or rebound.  There is no hepatosplenomegaly or tenderness.  There are no masses.  Exam of the legs reveal no clubbing, cyanosis, or edema.  The legs are without rashes.  The distal pulses are intact.  Cranial nerves II - XII are intact.  Motor and sensory functions are intact.  The gait is normal.  LABORATORY DATA: ECG today demonstrates atrial fibrillation with a rate of 105 beats per minute. He has marked ST-T wave changes consistent with dig effect.  Assessment / Plan: 1. Atrial fibrillation. In the past this has been paroxysmal and he has had a tendency to have bradycardia and pauses with conversion. For this reason we have not pushed more aggressive rate control. I've also not recommended antiarrhythmic drug therapy since this would likely result in need for pacemaker. He is really asymptomatic. He is an  affective anticoagulation. We will continue with his current dose of digoxin 0.25 mg daily. I have asked that his dig level be drawn with his next blood draw with his primary care.  2. Hypertension, controlled. Next  3. Hyperlipidemia.

## 2012-08-31 ENCOUNTER — Emergency Department (HOSPITAL_COMMUNITY)
Admission: EM | Admit: 2012-08-31 | Discharge: 2012-08-31 | Disposition: A | Discharge status: Home / Self Care | Payer: Medicare Other | Attending: Emergency Medicine | Admitting: Emergency Medicine

## 2012-08-31 ENCOUNTER — Encounter (HOSPITAL_COMMUNITY): Payer: Self-pay | Admitting: *Deleted

## 2012-08-31 DIAGNOSIS — Y929 Unspecified place or not applicable: Secondary | ICD-10-CM | POA: Insufficient documentation

## 2012-08-31 DIAGNOSIS — E785 Hyperlipidemia, unspecified: Secondary | ICD-10-CM | POA: Insufficient documentation

## 2012-08-31 DIAGNOSIS — Y9301 Activity, walking, marching and hiking: Secondary | ICD-10-CM | POA: Insufficient documentation

## 2012-08-31 DIAGNOSIS — E039 Hypothyroidism, unspecified: Secondary | ICD-10-CM | POA: Insufficient documentation

## 2012-08-31 DIAGNOSIS — W010XXA Fall on same level from slipping, tripping and stumbling without subsequent striking against object, initial encounter: Secondary | ICD-10-CM | POA: Insufficient documentation

## 2012-08-31 DIAGNOSIS — S51809A Unspecified open wound of unspecified forearm, initial encounter: Secondary | ICD-10-CM | POA: Insufficient documentation

## 2012-08-31 DIAGNOSIS — S41112A Laceration without foreign body of left upper arm, initial encounter: Secondary | ICD-10-CM

## 2012-08-31 DIAGNOSIS — I4891 Unspecified atrial fibrillation: Secondary | ICD-10-CM | POA: Insufficient documentation

## 2012-08-31 DIAGNOSIS — Z79899 Other long term (current) drug therapy: Secondary | ICD-10-CM | POA: Insufficient documentation

## 2012-08-31 DIAGNOSIS — W268XXA Contact with other sharp object(s), not elsewhere classified, initial encounter: Secondary | ICD-10-CM | POA: Insufficient documentation

## 2012-08-31 DIAGNOSIS — M109 Gout, unspecified: Secondary | ICD-10-CM | POA: Insufficient documentation

## 2012-08-31 DIAGNOSIS — I10 Essential (primary) hypertension: Secondary | ICD-10-CM | POA: Insufficient documentation

## 2012-08-31 NOTE — ED Provider Notes (Signed)
CSN: [id number]     Arrival date & time 08/31/12  1119 History     First MD Initiated Contact with Patient 08/31/12 1134     Chief Complaint  Patient presents with  . Extremity Laceration   (Consider location/radiation/quality/duration/timing/severity/associated sxs/prior Treatment) HPI Comments: Patient presents today with a laceration of his left forearm.  He reports that he tripped while walking and fell into a bush with thorns.  One of the thorns from the bush cut his arm.  He denies hitting hit head.  No LOC.  Denies any dizziness, SOB, or chest pain prior to the fall.  He has full ROM of his arm without pain.  He denies any numbness or tingling.  Bleeding is controlled at this time.  He is currently on Xarelto for A fib.  He reports that his last tetanus was two months ago.  The history is provided by the patient.    Past Medical History  Diagnosis Date  . Paroxysmal atrial fibrillation   . Dyslipidemia   . HTN (hypertension)   . Hypothyroidism   . Gout    Past Surgical History  Procedure Laterality Date  . Tonsillectomy and adenoidectomy  AGE 22   Family History  Problem Relation Age of Onset  . Stroke Father   . Hypertension Father   . Other Mother     CARDIOMEGALY  . Hypertension Mother   . Lung cancer Mother   . Diabetes Brother   . COPD Child    History  Substance Use Topics  . Smoking status: Never Smoker   . Smokeless tobacco: Not on file  . Alcohol Use: No    Review of Systems  Skin: Positive for wound.  All other systems reviewed and are negative.    Allergies  Cardizem  Home Medications   Current Outpatient Rx  Name  Route  Sig  Dispense  Refill  . allopurinol (ZYLOPRIM) 300 MG tablet   Oral   Take 300 mg by mouth daily.           Marland Kitchen atorvastatin (LIPITOR) 40 MG tablet   Oral   Take 40 mg by mouth daily.           . dabigatran (PRADAXA) 150 MG CAPS   Oral   Take 1 capsule (150 mg total) by mouth every 12 (twelve) hours.   60  capsule   5   . digoxin (LANOXIN) 0.25 MG tablet   Oral   Take 250 mcg by mouth daily.           . fenofibrate micronized (LOFIBRA) 200 MG capsule   Oral   Take 200 mg by mouth daily.           Marland Kitchen levothyroxine (SYNTHROID, LEVOTHROID) 100 MCG tablet   Oral   Take 100 mcg by mouth daily.           Marland Kitchen lisinopril-hydrochlorothiazide (PRINZIDE,ZESTORETIC) 10-12.5 MG per tablet   Oral   Take 1 tablet by mouth daily.           . Multiple Vitamin (MULTIVITAMIN) tablet   Oral   Take 1 tablet by mouth daily.           Marland Kitchen omega-3 acid ethyl esters (LOVAZA) 1 G capsule   Oral   Take 4 g by mouth daily.           . Sildenafil Citrate (VIAGRA PO)   Oral   Take by mouth as needed.           Marland Kitchen  traMADol (ULTRAM) 50 MG tablet                BP 159/67  Pulse 61  Temp(Src) 98.6 F (37 C) (Oral)  Resp 20  SpO2 100% Physical Exam  Nursing note and vitals reviewed. Constitutional: He appears well-developed and well-nourished. No distress.  HENT:  Head: Normocephalic and atraumatic.  Neck: Normal range of motion. Neck supple.  Cardiovascular: Normal rate, regular rhythm and normal heart sounds.   Pulses:      Radial pulses are 2+ on the right side, and 2+ on the left side.  Pulmonary/Chest: Effort normal and breath sounds normal.  Musculoskeletal: Normal range of motion.       Left elbow: He exhibits normal range of motion, no swelling and no deformity. No tenderness found.       Left wrist: He exhibits normal range of motion, no tenderness, no swelling and no deformity.  Neurological: He is alert. No sensory deficit.  Skin: He is not diaphoretic.     Psychiatric: He has a normal mood and affect.    ED Course   Procedures (including critical care time)  Labs Reviewed - No data to display No results found. No diagnosis found.  LACERATION REPAIR Performed by: Anne Shutter, Sylvi Rybolt Authorized by: Anne Shutter, Herbert Seta Consent: Verbal consent obtained. Risks  and benefits: risks, benefits and alternatives were discussed Consent given by: patient Patient identity confirmed: provided demographic data Prepped and Draped in normal sterile fashion Wound explored  Laceration Location: left forearm  Laceration Length: 9 cm jagged laceration  No Foreign Bodies seen or palpated  Anesthesia: local infiltration  Local anesthetic: lidocaine 2% with epinephrine  Anesthetic total: 6 ml  Irrigation method: syringe Amount of cleaning: standard  Skin closure: 4-0 Prolene  Number of sutures: 16  Technique: simple interrupted  Patient tolerance: Patient tolerated the procedure well with no immediate complications.  MDM  Patient with a laceration of his left forearm that was sustained after a mechanical fall into a bush with thorns.  Patient with full ROM of his arm.  Patient did not hit his head.  Neurovascularly intact.  Laceration repaired with sutures.   Last tetanus two months ago.  Patient stable for discharge.    Pascal Lux [place], PA-C 08/31/12 1338

## 2012-08-31 NOTE — ED Notes (Signed)
Pt reports he was walking in the garden this am, and "stumbbled" and fell in the bush.  Large lac noted in his L posterior FA.  Pt denies hitting his head.  No LOC.  Pt is A&O x 4.  No bleeding at this time.

## 2012-09-01 NOTE — ED Provider Notes (Signed)
Medical screening examination/treatment/procedure(s) were performed by non-physician practitioner and as supervising physician I was immediately available for consultation/collaboration.   Shelda Jakes, MD 09/01/12 [phone]

## 2013-05-25 LAB — IFOBT (OCCULT BLOOD): IMMUNOLOGICAL FECAL OCCULT BLOOD TEST: NEGATIVE

## 2013-08-01 ENCOUNTER — Encounter: Payer: Self-pay | Admitting: Cardiology

## 2013-08-01 ENCOUNTER — Ambulatory Visit (INDEPENDENT_AMBULATORY_CARE_PROVIDER_SITE_OTHER): Payer: Medicare Other | Admitting: Cardiology

## 2013-08-01 VITALS — BP 138/74 | HR 85 | Ht 73.0 in | Wt 181.0 lb

## 2013-08-01 DIAGNOSIS — I10 Essential (primary) hypertension: Secondary | ICD-10-CM

## 2013-08-01 DIAGNOSIS — I4891 Unspecified atrial fibrillation: Secondary | ICD-10-CM

## 2013-08-01 DIAGNOSIS — I482 Chronic atrial fibrillation, unspecified: Secondary | ICD-10-CM

## 2013-08-01 NOTE — Progress Notes (Signed)
[name] Date of Birth: Jul 25, 1931 Medical Record #[id number]  History of Present Illness: Mr. Delvecchio is seen today for followup. He has a history of paroxysmal atrial fibrillation. He has had some evidence of sick sinus syndrome with post conversion bradycardia and pauses. He has never been symptomatic. He has been managed with digoxin only. He has been on chronic anticoagulation with Pradaxa. He reports that he is doing very well. He is really not aware that his heart is out of rhythm. He denies any dizziness, syncope, palpitations, or fatigue. He has no shortness of breath or chest pain.   Current Outpatient Prescriptions on File Prior to Visit  Medication Sig Dispense Refill  . allopurinol (ZYLOPRIM) 300 MG tablet Take 300 mg by mouth daily.        Marland Kitchen atorvastatin (LIPITOR) 40 MG tablet Take 40 mg by mouth daily.        . digoxin (LANOXIN) 0.25 MG tablet Take 12.5 mcg by mouth daily.       . fenofibrate micronized (LOFIBRA) 200 MG capsule Take 200 mg by mouth daily.        Marland Kitchen levothyroxine (SYNTHROID, LEVOTHROID) 100 MCG tablet Take 100 mcg by mouth daily.        Marland Kitchen lisinopril-hydrochlorothiazide (PRINZIDE,ZESTORETIC) 10-12.5 MG per tablet Take 1 tablet by mouth daily.        . Multiple Vitamin (MULTIVITAMIN) tablet Take 1 tablet by mouth daily.        Marland Kitchen omega-3 acid ethyl esters (LOVAZA) 1 G capsule Take 4 g by mouth daily.        . Sildenafil Citrate (VIAGRA PO) Take by mouth as needed.        . traMADol (ULTRAM) 50 MG tablet        No current facility-administered medications on file prior to visit.    Allergies  Allergen Reactions  . Cardizem [Diltiazem Hcl] Rash    Past Medical History  Diagnosis Date  . Paroxysmal atrial fibrillation   . Dyslipidemia   . HTN (hypertension)   . Hypothyroidism   . Gout     Past Surgical History  Procedure Laterality Date  . Tonsillectomy and adenoidectomy  AGE 61    History  Smoking status  . Never Smoker   Smokeless  tobacco  . Not on file    History  Alcohol Use No    Family History  Problem Relation Age of Onset  . Stroke Father   . Hypertension Father   . Other Mother     CARDIOMEGALY  . Hypertension Mother   . Lung cancer Mother   . Diabetes Brother   . COPD Child     Review of Systems: As noted in history of present illness..  All other systems were reviewed and are negative.  Physical Exam: BP 138/74  Pulse 85  Ht 6\' 1"  (1.854 m)  Wt 181 lb (82.101 kg)  BMI 23.89 kg/m2 He is a pleasant white male in no acute distress.  HEENT exam is normal.  The carotids are 2+ without bruits.  There is no thyromegaly.  There is no JVD.  The lungs are clear.    The heart exam reveals an irregular rate with a normal S1 and S2.  There are no murmurs, gallops, or rubs.  The PMI is not displaced.   Abdominal exam reveals good bowel sounds.  There is no guarding or rebound.  There is no hepatosplenomegaly or tenderness.  There are no masses.  Exam of  the legs reveal no clubbing, cyanosis, or edema.  The legs are without rashes.  The distal pulses are intact.  Cranial nerves II - XII are intact.  Motor and sensory functions are intact.  The gait is normal.  LABORATORY DATA: ECG today demonstrates atrial fibrillation with a rate of 85 beats per minute. He has marked ST-T wave changes consistent with dig effect.  Assessment / Plan: 1. Atrial fibrillation. In the past this has been paroxysmal and he has had a tendency to have bradycardia and pauses with conversion. For this reason we have not pushed more aggressive rate control. I've also not recommended antiarrhythmic drug therapy since this would likely result in need for pacemaker. He is  asymptomatic. He is on effective anticoagulation. We will continue with his current dose of digoxin 0.125 mg daily. I will follow up in one year.  2. Hypertension, controlled.   3. Hyperlipidemia.

## 2013-08-01 NOTE — Patient Instructions (Signed)
Continue your current therapy  I will request your lab work from Dr. Waynard EdwardsPerini

## 2013-09-06 ENCOUNTER — Encounter: Payer: Self-pay | Admitting: Internal Medicine

## 2014-03-04 ENCOUNTER — Encounter: Payer: Self-pay | Admitting: Internal Medicine

## 2014-06-06 ENCOUNTER — Encounter: Payer: Self-pay | Admitting: Cardiology

## 2014-06-14 ENCOUNTER — Encounter: Payer: Self-pay | Admitting: Internal Medicine

## 2014-08-16 ENCOUNTER — Ambulatory Visit (INDEPENDENT_AMBULATORY_CARE_PROVIDER_SITE_OTHER): Payer: Medicare Other | Admitting: Cardiology

## 2014-08-16 ENCOUNTER — Encounter: Payer: Self-pay | Admitting: Cardiology

## 2014-08-16 VITALS — BP 132/70 | HR 85 | Ht 73.0 in | Wt 189.1 lb

## 2014-08-16 DIAGNOSIS — I482 Chronic atrial fibrillation, unspecified: Secondary | ICD-10-CM

## 2014-08-16 DIAGNOSIS — I10 Essential (primary) hypertension: Secondary | ICD-10-CM | POA: Diagnosis not present

## 2014-08-16 DIAGNOSIS — E785 Hyperlipidemia, unspecified: Secondary | ICD-10-CM | POA: Diagnosis not present

## 2014-08-16 NOTE — Progress Notes (Signed)
Jerry [name] Date of Birth: 1931/11/22 Medical Record #[id number]  History of Present Illness: Jerry [name] is seen today for followup. He has a history of paroxysmal atrial fibrillation. In the past he had evidence of sick sinus syndrome with post conversion bradycardia and pauses. He has never been symptomatic. He has been managed with digoxin only. He has been on chronic anticoagulation with Pradaxa. He reports that he is doing very well. He is really not aware that his heart is out of rhythm. He denies any dizziness, syncope, palpitations, or fatigue. He has no shortness of breath or chest pain.   Current Outpatient Prescriptions on File Prior to Visit  Medication Sig Dispense Refill  . allopurinol (ZYLOPRIM) 300 MG tablet Take 300 mg by mouth daily.      Marland Kitchen atorvastatin (LIPITOR) 40 MG tablet Take 40 mg by mouth daily.      . digoxin (LANOXIN) 0.25 MG tablet Take 12.5 mcg by mouth daily.     Marland Kitchen levothyroxine (SYNTHROID, LEVOTHROID) 100 MCG tablet Take 100 mcg by mouth daily.      . Multiple Vitamin (MULTIVITAMIN) tablet Take 1 tablet by mouth daily.      Marland Kitchen omega-3 acid ethyl esters (LOVAZA) 1 G capsule Take 4 g by mouth daily.      . Sildenafil Citrate (VIAGRA PO) Take by mouth as needed.      . traMADol (ULTRAM) 50 MG tablet     . XARELTO 20 MG TABS tablet      No current facility-administered medications on file prior to visit.    Allergies  Allergen Reactions  . Cardizem [Diltiazem Hcl] Rash    Past Medical History  Diagnosis Date  . Paroxysmal atrial fibrillation   . Dyslipidemia   . HTN (hypertension)   . Hypothyroidism   . Gout     Past Surgical History  Procedure Laterality Date  . Tonsillectomy and adenoidectomy  AGE 27    History  Smoking status  . Never Smoker   Smokeless tobacco  . Not on file    History  Alcohol Use No    Family History  Problem Relation Age of Onset  . Stroke Father   . Hypertension Father   . Other Mother     CARDIOMEGALY    . Hypertension Mother   . Lung cancer Mother   . Diabetes Brother   . COPD Child     Review of Systems: As noted in history of present illness..  All other systems were reviewed and are negative.  Physical Exam: BP 132/70 mmHg  Pulse 85  Ht 6\' 1"  (1.854 m)  Wt 85.758 kg (189 lb 1 oz)  BMI 24.95 kg/m2 He is a pleasant white male in no acute distress.  HEENT exam is normal.  The carotids are 2+ without bruits.  There is no thyromegaly.  There is no JVD.  The lungs are clear.    The heart exam reveals an irregular rate with a normal S1 and S2.  There are no murmurs, gallops, or rubs.  The PMI is not displaced.   Abdominal exam reveals good bowel sounds.  There is no guarding or rebound.  There is no hepatosplenomegaly or tenderness.  There are no masses.  Exam of the legs reveal no clubbing, cyanosis, or edema.  The legs are without rashes.  The distal pulses are intact.  Cranial nerves II - XII are intact.  Motor and sensory functions are intact.  The gait is normal.  LABORATORY DATA: ECG today demonstrates atrial fibrillation with a rate of 85 beats per minute. He has marked ST-T wave changes consistent with dig effect. This is unchanged from one year ago. I have personally reviewed and interpreted this study.   Assessment / Plan: 1. Atrial fibrillation. In the past this has been paroxysmal and he has had a tendency to have bradycardia and pauses with conversion. For this reason we have not pushed more aggressive rate control. I suspect now that he is in permanent atrial fibrillation which may be more stable for him.  He is  asymptomatic. He is on effective anticoagulation. We will continue with his current dose of digoxin 0.125 mg daily. I will follow up in one year.  2. Hypertension, controlled.   3. Hyperlipidemia.

## 2014-08-16 NOTE — Patient Instructions (Signed)
Continue your current therapy  I will see you in one year   

## 2015-11-20 ENCOUNTER — Encounter: Payer: Self-pay | Admitting: Cardiology

## 2015-11-20 ENCOUNTER — Ambulatory Visit (INDEPENDENT_AMBULATORY_CARE_PROVIDER_SITE_OTHER): Payer: Medicare Other | Admitting: Cardiology

## 2015-11-20 VITALS — BP 146/77 | HR 84 | Ht 73.0 in | Wt 192.4 lb

## 2015-11-20 DIAGNOSIS — I482 Chronic atrial fibrillation, unspecified: Secondary | ICD-10-CM

## 2015-11-20 DIAGNOSIS — I10 Essential (primary) hypertension: Secondary | ICD-10-CM

## 2015-11-20 NOTE — Patient Instructions (Signed)
Continue your current therapy  I will see you in one year   

## 2015-11-20 NOTE — Progress Notes (Signed)
Jerry [name] Date of Birth: Apr 23, 1931 Medical Record #[id number]  History of Present Illness: Jerry [name] is seen today for followup. He has a history of paroxysmal atrial fibrillation. In the past he had evidence of sick sinus syndrome with post conversion bradycardia and pauses. He has never been symptomatic. He has been managed with digoxin only. He has been on chronic anticoagulation with Xarelto. He reports that he is doing very well. He is really not aware that his heart is out of rhythm. He denies any dizziness, syncope, palpitations, or fatigue. He has no shortness of breath or chest pain. He stays active walking and going to the gym 3x/wk.   Current Outpatient Prescriptions on File Prior to Visit  Medication Sig Dispense Refill  . allopurinol (ZYLOPRIM) 300 MG tablet Take 300 mg by mouth daily.      Marland Kitchen. atorvastatin (LIPITOR) 40 MG tablet Take 40 mg by mouth daily.      . digoxin (LANOXIN) 0.25 MG tablet Take 12.5 mcg by mouth daily.     Marland Kitchen. HYDROcodone-acetaminophen (NORCO/VICODIN) 5-325 MG per tablet Take 1 tablet by mouth at bedtime. ONLY TAKE 1 TAB AT BEDTIME CAN TAKE EVERY 4-6 HOURS AS NEEDED  0  . ibandronate (BONIVA) 150 MG tablet Take 1 tablet by mouth every 30 (thirty) days. TAKE 1 TAB ONCE A MONTH  0  . levothyroxine (SYNTHROID, LEVOTHROID) 100 MCG tablet Take 100 mcg by mouth daily.      Marland Kitchen. lisinopril-hydrochlorothiazide (PRINZIDE,ZESTORETIC) 20-12.5 MG per tablet Take 1 tablet by mouth daily. TAKE 1 TAB DALY  1  . Multiple Vitamin (MULTIVITAMIN) tablet Take 1 tablet by mouth daily.      Marland Kitchen. omega-3 acid ethyl esters (LOVAZA) 1 G capsule Take 4 g by mouth daily.      . Sildenafil Citrate (VIAGRA PO) Take by mouth as needed.      . traMADol (ULTRAM) 50 MG tablet     . XARELTO 20 MG TABS tablet      No current facility-administered medications on file prior to visit.     Allergies  Allergen Reactions  . Cardizem [Diltiazem Hcl] Rash    Past Medical History:  Diagnosis  Date  . Dyslipidemia   . Gout   . HTN (hypertension)   . Hypothyroidism   . Paroxysmal atrial fibrillation [facility](HCC)     Past Surgical History:  Procedure Laterality Date  . TONSILLECTOMY AND ADENOIDECTOMY  AGE 58    History  Smoking Status  . Never Smoker  Smokeless Tobacco  . Not on file    History  Alcohol Use No    Family History  Problem Relation Age of Onset  . Stroke Father   . Hypertension Father   . Other Mother     CARDIOMEGALY  . Hypertension Mother   . Lung cancer Mother   . Diabetes Brother   . COPD Child     Review of Systems: As noted in history of present illness..  All other systems were reviewed and are negative.  Physical Exam: BP (!) 146/77   Pulse 84   Ht 6\' 1"  (1.854 m)   Wt 192 lb 6.4 oz (87.3 kg)   BMI 25.38 kg/m  He is a pleasant white male in no acute distress.  HEENT exam is normal.  The carotids are 2+ without bruits.  There is no thyromegaly.  There is no JVD.  The lungs are clear.    The heart exam reveals an irregular rate with a  normal S1 and S2.  There are no murmurs, gallops, or rubs.  The PMI is not displaced.   Abdominal exam reveals good bowel sounds.  There is no guarding or rebound.  There is no hepatosplenomegaly or tenderness.  There are no masses.  Exam of the legs reveal no clubbing, cyanosis, or edema.  The legs are without rashes.  The distal pulses are intact.  Cranial nerves II - XII are intact.  Motor and sensory functions are intact.  The gait is normal.  LABORATORY DATA: ECG today demonstrates atrial fibrillation with a rate of 84 beats per minute. He has marked ST-T wave changes consistent with dig effect. This is unchanged from one year ago. I have personally reviewed and interpreted this study.  Labs reviewed from primary care dated 09/18/15: cholesterol 142, triglycerides 144, HDL 42, LDL 71. Glucose 143, A1c 6.2%. Other chemistries and CBC normal.   Assessment / Plan: 1. Atrial fibrillation. In the past this has  been paroxysmal and he has had a tendency to have bradycardia and pauses with conversion. I suspect now that he is in permanent atrial fibrillation.  He is  asymptomatic. He is on effective anticoagulation. We will continue with his current dose of digoxin 0.125 mg daily. I suggest he have a Dig level checked with his next blood work. I will follow up in one year.  2. Hypertension, controlled.   3. Hyperlipidemia.

## 2016-01-13 ENCOUNTER — Emergency Department (HOSPITAL_COMMUNITY): Payer: Medicare Other

## 2016-01-13 ENCOUNTER — Encounter (HOSPITAL_COMMUNITY): Payer: Self-pay

## 2016-01-13 ENCOUNTER — Other Ambulatory Visit (HOSPITAL_COMMUNITY): Payer: Medicare Other

## 2016-01-13 ENCOUNTER — Inpatient Hospital Stay (HOSPITAL_COMMUNITY)
Admission: EM | Admit: 2016-01-13 | Discharge: 2016-02-12 | DRG: 064 | Disposition: E | Payer: Medicare Other | Attending: Family Medicine | Admitting: Family Medicine

## 2016-01-13 ENCOUNTER — Inpatient Hospital Stay (HOSPITAL_COMMUNITY): Payer: Medicare Other

## 2016-01-13 DIAGNOSIS — I959 Hypotension, unspecified: Secondary | ICD-10-CM | POA: Diagnosis not present

## 2016-01-13 DIAGNOSIS — I749 Embolism and thrombosis of unspecified artery: Secondary | ICD-10-CM | POA: Diagnosis not present

## 2016-01-13 DIAGNOSIS — E876 Hypokalemia: Secondary | ICD-10-CM | POA: Diagnosis not present

## 2016-01-13 DIAGNOSIS — Z66 Do not resuscitate: Secondary | ICD-10-CM | POA: Diagnosis present

## 2016-01-13 DIAGNOSIS — R4781 Slurred speech: Secondary | ICD-10-CM | POA: Diagnosis present

## 2016-01-13 DIAGNOSIS — J690 Pneumonitis due to inhalation of food and vomit: Secondary | ICD-10-CM | POA: Diagnosis present

## 2016-01-13 DIAGNOSIS — Z7189 Other specified counseling: Secondary | ICD-10-CM

## 2016-01-13 DIAGNOSIS — E870 Hyperosmolality and hypernatremia: Secondary | ICD-10-CM | POA: Diagnosis not present

## 2016-01-13 DIAGNOSIS — Z823 Family history of stroke: Secondary | ICD-10-CM

## 2016-01-13 DIAGNOSIS — E860 Dehydration: Secondary | ICD-10-CM | POA: Diagnosis not present

## 2016-01-13 DIAGNOSIS — E039 Hypothyroidism, unspecified: Secondary | ICD-10-CM | POA: Diagnosis not present

## 2016-01-13 DIAGNOSIS — E785 Hyperlipidemia, unspecified: Secondary | ICD-10-CM | POA: Diagnosis present

## 2016-01-13 DIAGNOSIS — M109 Gout, unspecified: Secondary | ICD-10-CM | POA: Diagnosis present

## 2016-01-13 DIAGNOSIS — I482 Chronic atrial fibrillation: Secondary | ICD-10-CM | POA: Diagnosis present

## 2016-01-13 DIAGNOSIS — W19XXXA Unspecified fall, initial encounter: Secondary | ICD-10-CM

## 2016-01-13 DIAGNOSIS — G9340 Encephalopathy, unspecified: Secondary | ICD-10-CM | POA: Diagnosis present

## 2016-01-13 DIAGNOSIS — R1311 Dysphagia, oral phase: Secondary | ICD-10-CM | POA: Diagnosis present

## 2016-01-13 DIAGNOSIS — J9601 Acute respiratory failure with hypoxia: Secondary | ICD-10-CM | POA: Diagnosis present

## 2016-01-13 DIAGNOSIS — D6832 Hemorrhagic disorder due to extrinsic circulating anticoagulants: Secondary | ICD-10-CM | POA: Diagnosis present

## 2016-01-13 DIAGNOSIS — I639 Cerebral infarction, unspecified: Secondary | ICD-10-CM

## 2016-01-13 DIAGNOSIS — R414 Neurologic neglect syndrome: Secondary | ICD-10-CM | POA: Diagnosis present

## 2016-01-13 DIAGNOSIS — R400 Somnolence: Secondary | ICD-10-CM

## 2016-01-13 DIAGNOSIS — I480 Paroxysmal atrial fibrillation: Secondary | ICD-10-CM | POA: Diagnosis present

## 2016-01-13 DIAGNOSIS — Z515 Encounter for palliative care: Secondary | ICD-10-CM

## 2016-01-13 DIAGNOSIS — Z4659 Encounter for fitting and adjustment of other gastrointestinal appliance and device: Secondary | ICD-10-CM

## 2016-01-13 DIAGNOSIS — Z7901 Long term (current) use of anticoagulants: Secondary | ICD-10-CM | POA: Diagnosis not present

## 2016-01-13 DIAGNOSIS — I63511 Cerebral infarction due to unspecified occlusion or stenosis of right middle cerebral artery: Secondary | ICD-10-CM | POA: Diagnosis not present

## 2016-01-13 DIAGNOSIS — Z801 Family history of malignant neoplasm of trachea, bronchus and lung: Secondary | ICD-10-CM

## 2016-01-13 DIAGNOSIS — R509 Fever, unspecified: Secondary | ICD-10-CM

## 2016-01-13 DIAGNOSIS — I63411 Cerebral infarction due to embolism of right middle cerebral artery: Secondary | ICD-10-CM | POA: Diagnosis present

## 2016-01-13 DIAGNOSIS — T45515A Adverse effect of anticoagulants, initial encounter: Secondary | ICD-10-CM | POA: Diagnosis present

## 2016-01-13 DIAGNOSIS — Z978 Presence of other specified devices: Secondary | ICD-10-CM

## 2016-01-13 DIAGNOSIS — R0603 Acute respiratory distress: Secondary | ICD-10-CM

## 2016-01-13 DIAGNOSIS — R4182 Altered mental status, unspecified: Secondary | ICD-10-CM

## 2016-01-13 DIAGNOSIS — Z7982 Long term (current) use of aspirin: Secondary | ICD-10-CM | POA: Diagnosis not present

## 2016-01-13 DIAGNOSIS — J189 Pneumonia, unspecified organism: Secondary | ICD-10-CM

## 2016-01-13 DIAGNOSIS — Z888 Allergy status to other drugs, medicaments and biological substances status: Secondary | ICD-10-CM

## 2016-01-13 DIAGNOSIS — I4891 Unspecified atrial fibrillation: Secondary | ICD-10-CM

## 2016-01-13 DIAGNOSIS — I10 Essential (primary) hypertension: Secondary | ICD-10-CM | POA: Diagnosis present

## 2016-01-13 DIAGNOSIS — R4701 Aphasia: Secondary | ICD-10-CM

## 2016-01-13 DIAGNOSIS — Z7983 Long term (current) use of bisphosphonates: Secondary | ICD-10-CM | POA: Diagnosis not present

## 2016-01-13 DIAGNOSIS — G8194 Hemiplegia, unspecified affecting left nondominant side: Secondary | ICD-10-CM | POA: Diagnosis present

## 2016-01-13 DIAGNOSIS — R29810 Facial weakness: Secondary | ICD-10-CM | POA: Diagnosis present

## 2016-01-13 DIAGNOSIS — Z86010 Personal history of colonic polyps: Secondary | ICD-10-CM | POA: Diagnosis not present

## 2016-01-13 DIAGNOSIS — I6789 Other cerebrovascular disease: Secondary | ICD-10-CM | POA: Diagnosis not present

## 2016-01-13 DIAGNOSIS — Z8249 Family history of ischemic heart disease and other diseases of the circulatory system: Secondary | ICD-10-CM

## 2016-01-13 DIAGNOSIS — R0602 Shortness of breath: Secondary | ICD-10-CM

## 2016-01-13 DIAGNOSIS — R0682 Tachypnea, not elsewhere classified: Secondary | ICD-10-CM

## 2016-01-13 DIAGNOSIS — Z833 Family history of diabetes mellitus: Secondary | ICD-10-CM

## 2016-01-13 DIAGNOSIS — Z825 Family history of asthma and other chronic lower respiratory diseases: Secondary | ICD-10-CM

## 2016-01-13 LAB — CBC
HCT: 42.4 % (ref 39.0–52.0)
Hemoglobin: 14.4 g/dL (ref 13.0–17.0)
MCH: 33.2 pg (ref 26.0–34.0)
MCHC: 34 g/dL (ref 30.0–36.0)
MCV: 97.7 fL (ref 78.0–100.0)
PLATELETS: 216 10*3/uL (ref 150–400)
RBC: 4.34 MIL/uL (ref 4.22–5.81)
RDW: 14.3 % (ref 11.5–15.5)
WBC: 8.8 10*3/uL (ref 4.0–10.5)

## 2016-01-13 LAB — APTT: APTT: 39 s — AB (ref 24–36)

## 2016-01-13 LAB — I-STAT CHEM 8, ED
BUN: 12 mg/dL (ref 6–20)
Calcium, Ion: 1.16 mmol/L (ref 1.15–1.40)
Chloride: 104 mmol/L (ref 101–111)
Creatinine, Ser: 0.9 mg/dL (ref 0.61–1.24)
Glucose, Bld: 151 mg/dL — ABNORMAL HIGH (ref 65–99)
HCT: 42 % (ref 39.0–52.0)
HEMOGLOBIN: 14.3 g/dL (ref 13.0–17.0)
Potassium: 3.8 mmol/L (ref 3.5–5.1)
SODIUM: 140 mmol/L (ref 135–145)
TCO2: 28 mmol/L (ref 0–100)

## 2016-01-13 LAB — DIFFERENTIAL
BASOS PCT: 1 %
Basophils Absolute: 0.1 10*3/uL (ref 0.0–0.1)
EOS ABS: 0.5 10*3/uL (ref 0.0–0.7)
EOS PCT: 5 %
Lymphocytes Relative: 32 %
Lymphs Abs: 2.8 10*3/uL (ref 0.7–4.0)
MONO ABS: 0.5 10*3/uL (ref 0.1–1.0)
Monocytes Relative: 5 %
Neutro Abs: 5.1 10*3/uL (ref 1.7–7.7)
Neutrophils Relative %: 57 %

## 2016-01-13 LAB — TSH: TSH: 1.323 u[IU]/mL (ref 0.350–4.500)

## 2016-01-13 LAB — COMPREHENSIVE METABOLIC PANEL
ALT: 34 U/L (ref 17–63)
ANION GAP: 9 (ref 5–15)
AST: 34 U/L (ref 15–41)
Albumin: 4.1 g/dL (ref 3.5–5.0)
Alkaline Phosphatase: 77 U/L (ref 38–126)
BUN: 9 mg/dL (ref 6–20)
CHLORIDE: 103 mmol/L (ref 101–111)
CO2: 26 mmol/L (ref 22–32)
Calcium: 9.5 mg/dL (ref 8.9–10.3)
Creatinine, Ser: 0.92 mg/dL (ref 0.61–1.24)
Glucose, Bld: 155 mg/dL — ABNORMAL HIGH (ref 65–99)
POTASSIUM: 3.9 mmol/L (ref 3.5–5.1)
SODIUM: 138 mmol/L (ref 135–145)
Total Bilirubin: 1.5 mg/dL — ABNORMAL HIGH (ref 0.3–1.2)
Total Protein: 7.2 g/dL (ref 6.5–8.1)

## 2016-01-13 LAB — TROPONIN I

## 2016-01-13 LAB — GLUCOSE, CAPILLARY: Glucose-Capillary: 144 mg/dL — ABNORMAL HIGH (ref 65–99)

## 2016-01-13 LAB — I-STAT TROPONIN, ED: TROPONIN I, POC: 0 ng/mL (ref 0.00–0.08)

## 2016-01-13 LAB — LIPID PANEL
Cholesterol: 139 mg/dL (ref 0–200)
HDL: 51 mg/dL (ref >40)
LDL CALC: 69 mg/dL (ref 0–99)
Total CHOL/HDL Ratio: 2.7 RATIO
Triglycerides: 94 mg/dL (ref <150)
VLDL: 19 mg/dL (ref 0–40)

## 2016-01-13 LAB — PROTIME-INR
INR: 1.89
PROTHROMBIN TIME: 22 s — AB (ref 11.4–15.2)

## 2016-01-13 LAB — CBG MONITORING, ED: GLUCOSE-CAPILLARY: 143 mg/dL — AB (ref 65–99)

## 2016-01-13 LAB — MRSA PCR SCREENING: MRSA BY PCR: NEGATIVE

## 2016-01-13 MED ORDER — STROKE: EARLY STAGES OF RECOVERY BOOK
Freq: Once | Status: AC
  Administered 2016-01-13: 07:00:00
  Filled 2016-01-13: qty 1

## 2016-01-13 MED ORDER — IOPAMIDOL (ISOVUE-370) INJECTION 76%
INTRAVENOUS | Status: AC
  Administered 2016-01-13: 90 mL via INTRAVENOUS
  Filled 2016-01-13: qty 50

## 2016-01-13 MED ORDER — ASPIRIN 300 MG RE SUPP
300.0000 mg | Freq: Every day | RECTAL | Status: DC
  Administered 2016-01-13 – 2016-01-21 (×5): 300 mg via RECTAL
  Filled 2016-01-13 (×6): qty 1

## 2016-01-13 MED ORDER — ACETAMINOPHEN 160 MG/5ML PO SOLN: 650.0000 mg | ORAL | Status: DC | PRN

## 2016-01-13 MED ORDER — ASPIRIN EC 325 MG PO TBEC: 325.0000 mg | DELAYED_RELEASE_TABLET | Freq: Every day | ORAL | Status: DC

## 2016-01-13 MED ORDER — LORAZEPAM 2 MG/ML IJ SOLN
1.0000 mg | Freq: Once | INTRAMUSCULAR | Status: AC
  Administered 2016-01-13: 1 mg via INTRAVENOUS
  Filled 2016-01-13: qty 1

## 2016-01-13 MED ORDER — ENOXAPARIN SODIUM 40 MG/0.4ML ~~LOC~~ SOLN
40.0000 mg | SUBCUTANEOUS | Status: DC
  Administered 2016-01-13 – 2016-01-21 (×9): 40 mg via SUBCUTANEOUS
  Filled 2016-01-13 (×9): qty 0.4

## 2016-01-13 MED ORDER — SODIUM CHLORIDE 0.9 % IV SOLN
INTRAVENOUS | Status: AC
  Administered 2016-01-13: 09:00:00 via INTRAVENOUS

## 2016-01-13 MED ORDER — LEVOTHYROXINE SODIUM 100 MCG IV SOLR
50.0000 ug | Freq: Every day | INTRAVENOUS | Status: DC
  Administered 2016-01-13 – 2016-01-14 (×2): 50 ug via INTRAVENOUS
  Filled 2016-01-13 (×2): qty 5

## 2016-01-13 MED ORDER — ACETAMINOPHEN 325 MG PO TABS: 650.0000 mg | ORAL_TABLET | ORAL | Status: DC | PRN

## 2016-01-13 MED ORDER — IOPAMIDOL (ISOVUE-370) INJECTION 76%
INTRAVENOUS | Status: AC
  Administered 2016-01-13: 05:00:00
  Filled 2016-01-13: qty 50

## 2016-01-13 MED ORDER — ACETAMINOPHEN 650 MG RE SUPP
650.0000 mg | RECTAL | Status: DC | PRN
  Administered 2016-01-18: 650 mg via RECTAL
  Filled 2016-01-13: qty 1

## 2016-01-13 MED ORDER — HYDRALAZINE HCL 20 MG/ML IJ SOLN
10.0000 mg | INTRAMUSCULAR | Status: DC | PRN
  Administered 2016-01-18 – 2016-01-28 (×5): 10 mg via INTRAVENOUS
  Filled 2016-01-13 (×6): qty 1

## 2016-01-13 NOTE — Consult Note (Signed)
NEURO HOSPITALIST CONSULT NOTE   Requestig physician: Dr. Mora Bellmanni  Reason for Consult: Acute onset of left sided weakness and facial droop  History obtained from:  EMS and Chart  HPI:                                                                                                                                          Jerry [name] is an 83 y.o. male who presents with acute onset of left facial droop with LUE and LLE weakness. ED staff noted right gaze deviation. LKN at 11:30 PM. Wife found him on the floor after hearing a thud. She noted weakness and felt that her husband was having a stroke. She called EMS at 3:06 AM. Has a-fib on Xarelto with no prior history of stroke.   PMHx also includes HTN, dyslipidemia, hypothyroidism and gout.   Past Medical History:  Diagnosis Date  . Dyslipidemia   . Gout   . HTN (hypertension)   . Hypothyroidism   . Paroxysmal atrial fibrillation [facility](HCC)     Past Surgical History:  Procedure Laterality Date  . TONSILLECTOMY AND ADENOIDECTOMY  AGE 16    Family History  Problem Relation Age of Onset  . Stroke Father   . Hypertension Father   . Other Mother     CARDIOMEGALY  . Hypertension Mother   . Lung cancer Mother   . Diabetes Brother   . COPD Child     Social History:  reports that he has never smoked. He does not have any smokeless tobacco history on file. He reports that he does not drink alcohol or use drugs.  Allergies  Allergen Reactions  . Cardizem [Diltiazem Hcl] Rash    MEDICATIONS:                                                                                                                     alfuzosin (UROXATRAL) 10 MG 24 hr tablet Take 10 mg by mouth daily. Historical Provider, MD Needs Review  allopurinol (ZYLOPRIM) 300 MG tablet Take 300 mg by mouth daily.  Historical Provider, MD Needs Review  atorvastatin (LIPITOR) 40 MG tablet Take 40 mg by mouth daily.  Historical Provider, MD Needs Review   CALCIUM PO Take 1 tablet by mouth daily.  Historical Provider, MD Needs Review  Cyanocobalamin (VITAMIN B-12 PO) Take 1 tablet by mouth daily. Historical Provider, MD Needs Review  digoxin (LANOXIN) 0.125 MG tablet Take 0.125 mg by mouth daily. Historical Provider, MD Needs Review  ibandronate (BONIVA) 150 MG tablet Take 1 tablet by mouth every 30 (thirty) days. TAKE 1 TAB ONCE A MONTH Historical Provider, MD Needs Review  levothyroxine (SYNTHROID, LEVOTHROID) 100 MCG tablet Take 100 mcg by mouth daily.  Historical Provider, MD Needs Review  lisinopril-hydrochlorothiazide (PRINZIDE,ZESTORETIC) 20-12.5 MG per tablet Take 1 tablet by mouth daily. TAKE 1 TAB DALY Historical Provider, MD Needs Review  Multiple Vitamin (MULTIVITAMIN) tablet Take 1 tablet by mouth daily.  Historical Provider, MD Needs Review  omega-3 acid ethyl esters (LOVAZA) 1 G capsule Take 2 g by mouth daily.  Historical Provider, MD Needs Review  traMADol (ULTRAM) 50 MG tablet Take 50 mg by mouth 2 (two) times daily.  Historical Provider, MD Needs Review  XARELTO 20 MG TABS tablet Take 20 mg by mouth daily with supper.  Historical Provider, MD Needs Review    ROS:                                                                                                                                       History obtained from patient. Complains of right shoulder sensation of stiffness.   There were no vitals taken for this visit.  General Examination:                                                                                                      HEENT-  Normocephalic. Neck brace is on.  Lungs- No gross wheezing. Respirations unlabored.  Extremities- No edema. No cyanosis.   Neurological Examination Mental Status: Drowsy. Able to answer questions and follow commands. Naming and repetition intact. No errors of syntax or grammar. No significant dysarthria.  Cranial Nerves: II: Left visual field cut, PERRL III,IV, VI: ptosis not  present, eyes deviated to right, able to cross 2 mm past midline to left V,VII: Left facial droop, decreased response to left facial stimulation VIII: hearing intact to questions and commands IX,X: mild hypophonia XI: head preferentially turned to right  XII: tongue deviates to left Motor: RUE 5/5 LUE 0/5 RLE 5/5 LLE 2/5 triple flexion reflex with 0/5 volitional movement Sensory: Insensate to LUE Deep Tendon Reflexes: 2+ RUE and RLE, 1+ LUE and LLE Plantars: Right: equivocal  Left: upgoing Cerebellar: No ataxia with FNF on right. Unable to  assess on left.  Gait: unable to assess    Lab Results: Basic Metabolic Panel: No results for input(s): NA, K, CL, CO2, GLUCOSE, BUN, CREATININE, CALCIUM, MG, PHOS in the last 168 hours.  Liver Function Tests: No results for input(s): AST, ALT, ALKPHOS, BILITOT, PROT, ALBUMIN in the last 168 hours. No results for input(s): LIPASE, AMYLASE in the last 168 hours. No results for input(s): AMMONIA in the last 168 hours.  CBC: No results for input(s): WBC, NEUTROABS, HGB, HCT, MCV, PLT in the last 168 hours.  Cardiac Enzymes: No results for input(s): CKTOTAL, CKMB, CKMBINDEX, TROPONINI in the last 168 hours.  Lipid Panel: No results for input(s): CHOL, TRIG, HDL, CHOLHDL, VLDL, LDLCALC in the last 168 hours.  CBG:  Recent Labs Lab February 05, 2016 0348  GLUCAP 143*    Microbiology: No results found for this or any previous visit.  Coagulation Studies: No results for input(s): LABPROT, INR in the last 72 hours.  Imaging: No results found.  Assessment: 1. Acute large right MCA stroke secondary to M1 occlusion.  2. Not an IV tPA candidate due to time criteria (age > 33, out of 3 hour time window). 3. Reviewed CTA and perfusion results with Dr. Corliss Skains. Not an endovascular candidate due to large area of infarcted tissue with comparatively small volume penumbra. Risks of hemorrhagic conversion secondary to luxury perfusion and anticoagulated  state significantly outweigh potential benefits.  4. Atrial fibrillation, on Xarelto.  Recommendations: 1. Admit to step-down unit. Neurology will follow in consultation.  2. Will defer to Stroke Team in the AM whether to continue Xarelto (failed therapy), switch to an alternate anticoagulant, or hold temporarily (at risk for hemorrhagic conversion). In my opinion it may be safest to continue Xarelto for now.  3. MRI brain. 4. TTE.  5. BP management.  6. IVF 75 cc/hr NS. Avoid hypotonic IV fluids.  7. SCDs for additional prophylaxis against DVT.  8. PT/OT/Speech.   Electronically signed: Dr. Caryl Pina 2016/02/05, 3:54 AM

## 2016-01-13 NOTE — Progress Notes (Signed)
Patient seen and examined, he is aaox3, vital stable, in afib but rate controlled. Neurology following, stroke work up in progress. He is npo currently, speech to repeat eval in am. CIR candidate.  (patient at baseline is very active, still goes to the Gym regularly prior to acute onset of stroke. He has h/o chronic afib on xarelto , he reports did not miss any dose of xarelto.)

## 2016-01-13 NOTE — Evaluation (Signed)
Clinical/Bedside Swallow Evaluation Patient Details  Name: Jerry FewHarold D Dufresne MRN: [id number] Date of Birth: 1931-12-30  Today's Date: 01/26/2016 Time: SLP Start Time (ACUTE ONLY): 1145 SLP Stop Time (ACUTE ONLY): 1153 SLP Time Calculation (min) (ACUTE ONLY): 8 min  Past Medical History:  Past Medical History:  Diagnosis Date  . Dyslipidemia   . Gout   . HTN (hypertension)   . Hypothyroidism   . Paroxysmal atrial fibrillation [facility](HCC)    Past Surgical History:  Past Surgical History:  Procedure Laterality Date  . TONSILLECTOMY AND ADENOIDECTOMY  AGE 83   HPI:  Pt presents with R Ischemic MCA Infarct. Pt with hx of A-fib, HTN, and Gout.    Assessment / Plan / Recommendation Clinical Impression  Pt has mild-moderate left-sided weakness with facial droop and lingual deviation. Incomplete labial seal around the cup results in moderate anterior loss of bolus, which improves with use of straw; however, straw also elicits prolonged coughing and wet vocal quality. After several bites of puree he started to have delayed throat clearing. Recommend to proceed with MBS to better assess safe swallowing function.    Aspiration Risk  Moderate aspiration risk    Diet Recommendation NPO except meds   Medication Administration: Crushed with puree    Other  Recommendations Oral Care Recommendations: Oral care QID   Follow up Recommendations  (tba)      Frequency and Duration            Prognosis Prognosis for Safe Diet Advancement: Good Barriers to Reach Goals: Cognitive deficits      Swallow Study   General Date of Onset: 01/20/2016 HPI: Pt presents with R Ischemic MCA Infarct. Pt with hx of A-fib, HTN, and Gout.  Type of Study: Bedside Swallow Evaluation Previous Swallow Assessment: none in chart Diet Prior to this Study: NPO Temperature Spikes Noted: No Respiratory Status: Nasal cannula History of Recent Intubation: No Behavior/Cognition: Cooperative;Lethargic/Drowsy;Requires  cueing Oral Cavity Assessment: Dry Oral Care Completed by SLP: No Oral Cavity - Dentition: Adequate natural dentition Self-Feeding Abilities: Able to feed self;Needs assist Patient Positioning: Upright in bed Baseline Vocal Quality: Low vocal intensity Volitional Cough: Strong Volitional Swallow: Able to elicit    Oral/Motor/Sensory Function Overall Oral Motor/Sensory Function: Moderate impairment Facial ROM: Reduced left;Suspected CN VII (facial) dysfunction Facial Symmetry: Abnormal symmetry left;Suspected CN VII (facial) dysfunction Facial Strength: Reduced left;Suspected CN VII (facial) dysfunction Lingual ROM: Reduced right;Suspected CN XII (hypoglossal) dysfunction Lingual Symmetry: Abnormal symmetry left;Suspected CN XII (hypoglossal) dysfunction Lingual Strength: Reduced;Suspected CN XII (hypoglossal) dysfunction   Ice Chips Ice chips: Within functional limits Presentation: Spoon   Thin Liquid Thin Liquid: Impaired Presentation: Cup;Self Fed;Straw;Spoon Oral Phase Impairments: Reduced labial seal Oral Phase Functional Implications: Left anterior spillage Pharyngeal  Phase Impairments: Cough - Immediate    Nectar Thick Nectar Thick Liquid: Not tested   Honey Thick Honey Thick Liquid: Not tested   Puree Puree: Impaired Presentation: Spoon Pharyngeal Phase Impairments: Throat Clearing - Delayed   Solid   GO   Solid: Not tested        Maxcine HamPaiewonsky, Florina Glas 01/24/2016,1:27 PM  Maxcine HamLaura Paiewonsky, M.A. CCC-SLP [phone]

## 2016-01-13 NOTE — Evaluation (Signed)
Occupational Therapy Evaluation Patient Details Name: Jerry FewHarold D Recht MRN: [id number] DOB: 16-Sep-1931 Today's Date: 01/27/2016    History of Present Illness Pt admitted with acute onset of Lt facial droop, Lt sided weaknss, and Rt gaze deviation.  CTA showed large Rt MCA infarct.   PMH:  gout, HTN   Clinical Impression   Pt admitted with above. He demonstrates the below listed deficits and will benefit from continued OT to maximize safety and independence with BADLs.  Pt presents to OT with Lt hemiparesis, Lt inattention/neglect, impaired balance, impaired cognition including decreased attention, awareness, problem solving.  Perseveration also noted.  He requires mod - max A for ADLs and was fully independent PTA.  Feel he would benefit from CIR level therapies to allow him to return home safely with wife assisting.       Follow Up Recommendations  CIR;Supervision/Assistance - 24 hour    Equipment Recommendations  3 in 1 bedside commode;Tub/shower seat    Recommendations for Other Services Rehab consult     Precautions / Restrictions Precautions Precautions: Fall Precaution Comments: Watch HR      Mobility Bed Mobility Overal bed mobility: Needs Assistance Bed Mobility: Supine to Sit;Sit to Supine     Supine to sit: Mod assist Sit to supine: Mod assist   General bed mobility comments: Assist to move Lt LE off bed and to lift Lt LE onto bed   Transfers                      Balance Overall balance assessment: Needs assistance Sitting-balance support: Feet supported Sitting balance-Leahy Scale: Fair Sitting balance - Comments: Pt able to work toward doffing Rt sock.  As he fatigued, he demonstrated posterior and Lt lean  Postural control: Posterior lean;Left lateral lean                                  ADL Overall ADL's : Needs assistance/impaired Eating/Feeding: NPO   Grooming: Wash/dry hands;Wash/dry face;Oral care;Brushing hair;Minimal  assistance;Sitting   Upper Body Bathing: Moderate assistance;Sitting Upper Body Bathing Details (indicate cue type and reason): cues for thoroughness and to attend to Lt side  Lower Body Bathing: Maximal assistance;Sit to/from stand   Upper Body Dressing : Maximal assistance;Sitting   Lower Body Dressing: Total assistance;Sit to/from stand Lower Body Dressing Details (indicate cue type and reason): Pt attempts to doff Rt sock, but demonstrates difficulty hooking thumb and fingers in the cuff of the sock - fatigues  Toilet Transfer: Moderate assistance;+2 for physical assistance;Stand-pivot;BSC           Functional mobility during ADLs: Moderate assistance;+2 for physical assistance General ADL Comments: HR to 152 non sustained while EOB      Vision Additional Comments: Pt with Rt gaze preference.  He will not track object consistently, but with mod cues he  is able to cross midline to locate items on his Lt    Perception Perception Perception Tested?: Yes Perception Deficits: Inattention/neglect Inattention/Neglect: Does not attend to left visual field;Does not attend to left side of body Spatial deficits: requires mod cues to attend to [employer]t    Praxis Praxis Praxis tested?: Deficits Deficits: Perseveration Praxis-Other Comments: Pt noted to perseverate on doffing socks requiring max cues to extinguish behavior     Pertinent Vitals/Pain Pain Assessment: No/denies pain     Hand Dominance Right   Extremity/Trunk Assessment Upper Extremity Assessment Upper Extremity  Assessment: LUE deficits/detail LUE Deficits / Details: Lt inattention/neglect present.  Pt demonstrates full AROM, but requires cues to fully extend digits and demonstrates impaired FMC  LUE Coordination: decreased fine motor   Lower Extremity Assessment Lower Extremity Assessment: Defer to PT evaluation       Communication Communication Communication: Expressive difficulties (low volume )   Cognition  Arousal/Alertness: Awake/alert;Lethargic Behavior During Therapy: Flat affect Overall Cognitive Status: Impaired/Different from baseline Area of Impairment: Attention;Following commands;Safety/judgement;Awareness;Problem solving   Current Attention Level: Sustained   Following Commands: Follows one step commands consistently;Follows multi-step commands inconsistently Safety/Judgement: Decreased awareness of safety;Decreased awareness of deficits Awareness: Intellectual Problem Solving: Slow processing;Difficulty sequencing;Requires verbal cues;Requires tactile cues General Comments: Pt is fully oriented.  He feels he is at baseline from CVA, does not recognize deficits even when they are pointed out to him.  He is very distracted by lines and wires    General Comments       Exercises       Shoulder Instructions      Home Living Family/patient expects to be discharged to:: Inpatient rehab Living Arrangements: Spouse/significant other Available Help at Discharge: Family               Bathroom Shower/Tub: Walk-in shower   Bathroom Toilet: Handicapped height     Home Equipment: Grab bars - tub/shower      Lives With: Spouse    Prior Functioning/Environment Level of Independence: Independent        Comments: Pt is very active and drives        OT Problem List: Decreased strength;Decreased range of motion;Decreased activity tolerance;Impaired balance (sitting and/or standing);Impaired vision/perception;Decreased coordination;Decreased cognition;Decreased knowledge of use of DME or AE;Decreased safety awareness;Cardiopulmonary status limiting activity;Impaired UE functional use;Impaired sensation   OT Treatment/Interventions: Self-care/ADL training;Neuromuscular education;DME and/or AE instruction;Therapeutic activities;Cognitive remediation/compensation;Visual/perceptual remediation/compensation;Patient/family education;Balance training    OT Goals(Current goals can  be found in the care plan section) Acute Rehab OT Goals Patient Stated Goal: to go home  OT Goal Formulation: With patient/family Time For Goal Achievement: 01/27/16 Potential to Achieve Goals: Good ADL Goals Pt Will Perform Eating: with supervision;sitting Pt Will Perform Grooming: with supervision;sitting Pt Will Perform Upper Body Bathing: with min assist;sitting Pt Will Perform Lower Body Bathing: with mod assist;sit to/from stand Pt Will Transfer to Toilet: with min assist;stand pivot transfer;bedside commode Pt Will Perform Toileting - Clothing Manipulation and hygiene: with mod assist;sit to/from stand Additional ADL Goal #1: Pt will locate ADL items on Lt with min cues   OT Frequency: Min 2X/week   Barriers to D/C:            Co-evaluation              End of Session Equipment Utilized During Treatment: Oxygen Nurse Communication: Mobility status  Activity Tolerance: Patient tolerated treatment well Patient left: in bed;with call bell/phone within reach;with bed alarm set;with family/visitor present;Other (comment) (transport tech in room )   Time: 1610-9604 OT Time Calculation (min): 32 min Charges:  OT General Charges $OT Visit: 1 Procedure OT Evaluation $OT Eval Moderate Complexity: 1 Procedure OT Treatments $Self Care/Home Management : 8-22 mins G-Codes:    Dmarco Baldus M 01-23-16, 5:53 PM

## 2016-01-13 NOTE — Evaluation (Signed)
Speech Language Pathology Evaluation Patient Details Name: Jerry [name] MRN: [id number] DOB: Jan 24, 1931 Today's Date: 01/12/2016 Time: [id number] SLP Time Calculation (min) (ACUTE ONLY): 14 min  Problem List:  Patient Active Problem List   Diagnosis Date Noted  . Acute ischemic right MCA stroke (HCC) 01/27/2016  . Stroke (cerebrum) (HCC) 01/14/2016  . Dyslipidemia   . Essential hypertension   . Hypothyroidism   . ATRIAL FIBRILLATION 12/12/2009  . DIARRHEA 12/12/2009  . PERSONAL HX COLONIC POLYPS 12/12/2009   Past Medical History:  Past Medical History:  Diagnosis Date  . Dyslipidemia   . Gout   . HTN (hypertension)   . Hypothyroidism   . Paroxysmal atrial fibrillation [facility](HCC)    Past Surgical History:  Past Surgical History:  Procedure Laterality Date  . TONSILLECTOMY AND ADENOIDECTOMY  AGE 83   HPI:  Pt presents with R Ischemic MCA Infarct. Pt with hx of A-fib, HTN, and Gout.    Assessment / Plan / Recommendation Clinical Impression  Pt has cognitive impairments that include reduced sustained attention, left-sided attention, basic problem solving, and intellectual/emergent awareness. Pt has a right-sided gaze preference but with Mod cueing he will bring his head toward the left, although he has difficulty sustaining this and his gaze continues to stay toward the right. Mod cues were also provided for intellectual and emergent awareness of left-sided phsyical deficits. His speech is mild-moderately slurred and with low vocal intensity, which further impacts his intelligibility of speech at the phrase and sentence level. Recommend additional SLP f/u acutely and with CIR upon d/c.    SLP Assessment  Patient needs continued Speech Lanaguage Pathology Services    Follow Up Recommendations  Inpatient Rehab    Frequency and Duration min 2x/week  2 weeks      SLP Evaluation Cognition  Overall Cognitive Status: Impaired/Different from baseline Arousal/Alertness:   (drowsy) Orientation Level: Oriented X4 Attention: Sustained Sustained Attention: Impaired Sustained Attention Impairment: Verbal basic;Functional basic Awareness: Impaired Awareness Impairment: Intellectual impairment;Emergent impairment;Anticipatory impairment Problem Solving: Impaired Problem Solving Impairment: Functional basic Safety/Judgment: Impaired       Comprehension  Auditory Comprehension Overall Auditory Comprehension: Appears within functional limits for tasks assessed (for basic tasks)    Expression Expression Primary Mode of Expression: Verbal Verbal Expression Overall Verbal Expression: Appears within functional limits for tasks assessed   Oral / Motor  Oral Motor/Sensory Function Overall Oral Motor/Sensory Function: Moderate impairment Facial ROM: Reduced left;Suspected CN VII (facial) dysfunction Facial Symmetry: Abnormal symmetry left;Suspected CN VII (facial) dysfunction Facial Strength: Reduced left;Suspected CN VII (facial) dysfunction Lingual ROM: Reduced right;Suspected CN XII (hypoglossal) dysfunction Lingual Symmetry: Abnormal symmetry left;Suspected CN XII (hypoglossal) dysfunction Lingual Strength: Reduced;Suspected CN XII (hypoglossal) dysfunction Motor Speech Overall Motor Speech: Impaired Respiration: Within functional limits Phonation: Low vocal intensity Articulation: Impaired Level of Impairment: Sentence Intelligibility: Intelligibility reduced Phrase: 50-74% accurate Sentence: 50-74% accurate Motor Planning: Witnin functional limits Motor Speech Errors: Not applicable   GO                    Jerry [name], Jerry [name] 01/22/2016, 1:37 PM  Jerry [name], M.A. CCC-SLP [phone]

## 2016-01-13 NOTE — Progress Notes (Signed)
Inpatient Rehabilitation  PT has evaluated pt. and is recommending IP rehab.  Patient was screened by Weldon PickingSusan Jeraldean Wechter for appropriateness for an Inpatient Acute Rehab consult.  At this time, we are recommending Inpatient Rehab consult.  Please order consult if you are agreeable.  Weldon PickingSusan Kariyah Baugh PT Inpatient Rehab Admissions Coordinator Cell [phone] Office [phone]

## 2016-01-13 NOTE — Progress Notes (Signed)
01/27/2016 1600  Clinical Encounter Type  Visited With Patient and family together (Wife)  Visit Type Initial  Referral From Family  Spiritual Encounters  Spiritual Needs Prayer;Emotional  Stress Factors  Patient Stress Factors Health changes  Family Stress Factors Health changes;Loss of control   - Prayed for patient and wife. - Met w/ medical staff (bedside RN Pamala Hurry) regarding pt. physical needs.  - Will follow, as needed.  - Rev. [place] MDiv ThM

## 2016-01-13 NOTE — Progress Notes (Signed)
STROKE TEAM PROGRESS NOTE   HISTORY OF PRESENT ILLNESS (per record) Jerry [name] is an 83 y.o. male who presents with acute onset of left facial droop with LUE and LLE weakness. ED staff noted right gaze deviation. He was LKW at 11:30 PM 01/12/2016. Wife found him on the floor after hearing a thud. She noted weakness and felt that her husband was having a stroke. She called EMS at 3:06 AM. Has a-fib on Xarelto with no prior history of stroke. PMHx also includes HTN, dyslipidemia, hypothyroidism and gout. Patient was not administered IV t-PA. He was considered for intervention, but small volume penumbra with risk outweighing benefits given AC. He was admitted to stepdown for further evaluation and treatment.   SUBJECTIVE (INTERVAL HISTORY) Wife is at bedside. She recounted HPI with me. Pt is awake alert and AAO x3, but moderate dysarthria. LUE and LLE mild hemiparesis which is much better than expected.    OBJECTIVE Temp:  [94.4 F (34.7 C)-97.4 F (36.3 C)] 97.3 F (36.3 C) (01/02 0656) Pulse Rate:  [77-110] 77 (01/02 0656) Cardiac Rhythm: Atrial fibrillation (01/02 0700) Resp:  [17-24] 18 (01/02 0656) BP: (134-150)/(81-103) 146/98 (01/02 0656) SpO2:  [95 %-99 %] 99 % (01/02 0656) Weight:  [87 kg (191 lb 12.8 oz)-87.2 kg (192 lb 5 oz)] 87 kg (191 lb 12.8 oz) (01/02 0656)  CBC:  Recent Labs Lab 02/11/16 0350 Feb 11, 2016 0356  WBC 8.8  --   NEUTROABS 5.1  --   HGB 14.4 14.3  HCT 42.4 42.0  MCV 97.7  --   PLT 216  --     Basic Metabolic Panel:  Recent Labs Lab 2016-02-11 0350 02-11-2016 0356  NA 138 140  K 3.9 3.8  CL 103 104  CO2 26  --   GLUCOSE 155* 151*  BUN 9 12  CREATININE 0.92 0.90  CALCIUM 9.5  --     Lipid Panel:    Component Value Date/Time   CHOL 139 Feb 11, 2016 0730   TRIG 94 11-Feb-2016 0730   HDL 51 2016/02/11 0730   CHOLHDL 2.7 02/11/2016 0730   VLDL 19 February 11, 2016 0730   LDLCALC 69 2016-02-11 0730   HgbA1c: No results found for: HGBA1C Urine Drug  Screen: No results found for: LABOPIA, COCAINSCRNUR, LABBENZ, AMPHETMU, THCU, LABBARB    IMAGING I have personally reviewed the radiological images below and agree with the radiology interpretations.  Ct Head Code Stroke W/o Cm 02-11-2016 1. Acute multifocal small to moderate MCA territory infarct, no hemorrhage. Dense RIGHT middle cerebral artery consistent with thromboembolism. 2. ASPECTS is 7.   Ct Angio Head W Or Wo Contrast 02-11-2016 Emergent RIGHT M1 large vessel occlusion. Moderate stenosis RIGHT P3 segment.   Ct Angio Neck W And/or Wo Contrast February 11, 2016 Atherosclerosis without hemodynamically significant stenosis or acute vascular process.   Ct Cervical Spine Wo Contrast 02/11/2016 No acute cervical spine fracture or malalignment. Severe LEFT C3-4 through C5-6 neural foraminal narrowing.   Ct Cerebral Perfusion W Contrast 02-11-16 Acute large RIGHT MCA territory infarct, with additional 72 cc penumbra/brain at risk.   MRI pending  TTE pending   PHYSICAL EXAM  Temp:  [94.4 F (34.7 C)-97.7 F (36.5 C)] 97.7 F (36.5 C) (01/02 1100) Pulse Rate:  [77-110] 86 (01/02 1100) Resp:  [16-24] 16 (01/02 1100) BP: (134-150)/(81-103) 139/85 (01/02 1100) SpO2:  [95 %-99 %] 99 % (01/02 1100) Weight:  [191 lb 12.8 oz (87 kg)-192 lb 5 oz (87.2 kg)] 191 lb 12.8 oz (87 kg) (01/02  [id number])  General - Well nourished, well developed, in no apparent distress.  Ophthalmologic - Fundi not visualized due to noncooperation.  Cardiovascular - irregularly irregular heart rate and rhythm.  Mental Status -  Level of arousal and orientation to time, place, and person were intact. Language including expression, naming, repetition, comprehension was assessed and found intact. However, moderate dysarthria and left side neglect.  Cranial Nerves II - XII - II - Visual field with left visual neglect. III, IV, VI - Extraocular movements showed right gaze preference and not able to cross midline to  left. V - Facial sensation intact bilaterally. VII - left facial droop. VIII - Hearing & vestibular intact bilaterally. X - Palate elevates symmetrically, moderate dysarthria. XI - Chin turning & shoulder shrug intact bilaterally. XII - Tongue protrusion intact.  Motor Strength - [name] was normal in RUE and RLE, but 4-/5 LUE and 4+/5 LLE. Bulk was normal and fasciculations were absent.   Motor Tone - Muscle tone was assessed at the neck and appendages and was normal.  Reflexes - The patient's reflexes were 1+ in all extremities and he had no pathological reflexes.  Sensory - Light touch, temperature/pinprick, were assessed and were symmetrical.    Coordination - The patient had normal movements in the hands with no ataxia or dysmetria.  Tremor was absent.  Gait and Station - deferred to PT in room   ASSESSMENT/PLAN Jerry [name] is a 83 y.o. male with history of AF on xarelto, HTN, HLD, hypothyroidism and gout presenting with L side weakness and facial droop. He did not receive IV t-PA.   Stroke:  Non-dominant right MCA large infarct embolic most likely source secondary to known atrial fibrillation   Resultant  left hemiparesis and left neglect  Code Stroke CT  R MCA territory infarcts. Dense R MCA  CTA head  Emergent R M1 large vessel occlusion, moderate R P3 stenosis  CTA neck  Atherosclerosis w/o hemodynamic stenosis  CTP  Large R MCA core infarct, and small penumbra   MRI  pending   2D Echo  pending   LDL 69  HgbA1c pending  lovenox for VTE prophylaxis  Xarelto (rivaroxaban) daily prior to admission, now on aspirin 300 mg suppository daily due to large infarct. Consider to switch to eliquis in one week.   Patient counseled to be compliant with his antithrombotic medications  Ongoing aggressive stroke risk factor management  Therapy recommendations:  pending   Disposition:  pending   Atrial Fibrillation  Home anticoagulation:   Xarelto (rivaroxaban) daily   Failed Xarelto, now on aspirin 300 supp given large infarct  Consider to swtich to eliquis in on week    Hypertension  Stable Permissive hypertension (OK if < 220/120) but gradually normalize in 5-7 days Long-term BP goal normotensive  Hyperlipidemia  Home meds:  lipitor 40 and lovaza  Resume statin in hospital once po/tube route established  LDL 69, goal < 70  Continue statin at discharge  Other Stroke Risk Factors  Advanced age  Family hx stroke (father)  Other Active Problems  hypothyroidism  Hospital day # 0  Marvel PlanJindong Garett Tetzloff, MD PhD Stroke Neurology 01/30/2016 3:29 PM   To contact Stroke Continuity provider, please refer to [email or web address]. After hours, contact General Neurology

## 2016-01-13 NOTE — ED Triage Notes (Signed)
Pt found on floor by wife, [facility] LNW 2230 before going to bed. Found by wife on floor with L sided paralysis and slurred speech.

## 2016-01-13 NOTE — Care Management Note (Signed)
Case Management Note  Patient Details  Name: Jerry [name] MRN: [id number] Date of Birth: 08-05-1931  Subjective/Objective:  Presents with large  Right ischemi MCA stroke, and afib, has been on xarelto, per pt eval rec CIR, NCM notified MD to put in CIR consult.  NCM will cont to follow for dc needs.                  Action/Plan:   Expected Discharge Date:                  Expected Discharge Plan:  IP Rehab Facility  In-House Referral:     Discharge planning Services  CM Consult  Post Acute Care Choice:    Choice offered to:     DME Arranged:    DME Agency:     HH Arranged:    HH Agency:     Status of Service:  In process, will continue to follow  If discussed at Long Length of Stay Meetings, dates discussed:    Additional Comments:  Leone Havenaylor, Teoman Giraud Clinton, RN 01/15/2016, 4:00 PM

## 2016-01-13 NOTE — Progress Notes (Signed)
Modified Barium Swallow Progress Note  Patient Details  Name: Jerry [name] MRN: [id number] Date of Birth: [birth year]  Today's Date: 01/16/2016  Modified Barium Swallow completed.  Full report located under Chart Review in the Imaging Section.  Brief recommendations include the following:  Clinical Impression  Pt has a moderate oropharyngeal dysphagia due to sensorimotor and cognitive deficits. Weak lingual manipulation results in decreased bolus cohesion and delayed transit. Thickened liquids spill to the pyriform sinuses, with pt often trying to talk while liquids are continuing to spill into his pharynx. Penetration occurs consistently even with small spoonfuls of thickened liquids, and elicited a throat clear only one time. Multiple cues were needed for effortful cough to clear penetration each time if occurred. Penetration appeared to occur more deeply as residue built up more in his valleculae. No airway compromise occurred with trials of puree, although there is moderate vallecular residue that remains post-swallow. Cues for a second swallow can reduce but not eliminate this residue, and pt cognitively is not able to follow commands to try additional compensatory strategies. Recommend that pt remain NPO except for meds crushed in puree with use of multiple swallows per bolus. Pt may also have a small, pureed snack when fully alert to facilitate pharyngeal strengthening.    Swallow Evaluation Recommendations       SLP Diet Recommendations: NPO except meds;Other (Comment) (few bites of puree when alert)       Medication Administration: Crushed with puree   Supervision: Staff to assist with self feeding;Full supervision/cueing for compensatory strategies   Compensations: Slow rate;Small sips/bites;Multiple dry swallows after each bite/sip   Postural Changes: Remain semi-upright after after feeds/meals (Comment);Seated upright at 90 degrees   Oral Care Recommendations: Oral care  QID   Other Recommendations: Have oral suction available;Remove water pitcher;Prohibited food (jello, ice cream, thin soups)    Maxcine Hamaiewonsky, Sharelle Burditt 01/24/2016,3:06 PM   Maxcine HamLaura Paiewonsky, M.A. CCC-SLP [phone]

## 2016-01-13 NOTE — H&P (Signed)
History and Physical    Jerry [name] ZOX:[id number] DOB: [birth year] DOA: 01/27/2016  PCP: Ezequiel Kayser, MD  Patient coming from: Home.  Chief Complaint: Left-sided weakness.  HPI: Jerry [name] is a 83 y.o. male with history of atrial fibrillation, hypertension, hypothyroidism and gout was brought to the ER after patient's wife found that patient was weak on the left side with right-sided gaze preference. Patient went to bed at around 11:30 PM. At around 2:30 AM patient's wife woke up to go to the bathroom and patient was instructing her to help him raise his hand. Patient's wife tried to help him but slid onto the floor. EMS was called and patient was brought to the ER. CT head followed by CT angiogram of head and neck was done. Showed large right sided acute MCA infarct Neurologist at this time felt that patient was not a candidate for TPA or endovascular procedure.   ED Course: CT head followed by CT angiogram of the head and neck was done which showed large right acute MCA infarct.  Review of Systems: As per HPI, rest all negative.   Past Medical History:  Diagnosis Date  . Dyslipidemia   . Gout   . HTN (hypertension)   . Hypothyroidism   . Paroxysmal atrial fibrillation [facility])     Past Surgical History:  Procedure Laterality Date  . TONSILLECTOMY AND ADENOIDECTOMY  AGE 73     reports that he has never smoked. He has never used smokeless tobacco. He reports that he does not drink alcohol or use drugs.  Allergies  Allergen Reactions  . Cardizem [Diltiazem Hcl] Rash    Family History  Problem Relation Age of Onset  . Stroke Father   . Hypertension Father   . Other Mother     CARDIOMEGALY  . Hypertension Mother   . Lung cancer Mother   . Diabetes Brother   . COPD Child     Prior to Admission medications   Medication Sig Start Date End Date Taking? Authorizing Provider  alfuzosin (UROXATRAL) 10 MG 24 hr tablet Take 10 mg by mouth daily. 12/15/15  Yes  Historical Provider, MD  allopurinol (ZYLOPRIM) 300 MG tablet Take 300 mg by mouth daily.     Yes Historical Provider, MD  atorvastatin (LIPITOR) 40 MG tablet Take 40 mg by mouth daily.     Yes Historical Provider, MD  CALCIUM PO Take 1 tablet by mouth daily.    Yes Historical Provider, MD  Cyanocobalamin (VITAMIN B-12 PO) Take 1 tablet by mouth daily.   Yes Historical Provider, MD  digoxin (LANOXIN) 0.125 MG tablet Take 0.125 mg by mouth daily.   Yes Historical Provider, MD  ibandronate (BONIVA) 150 MG tablet Take 1 tablet by mouth every 30 (thirty) days. TAKE 1 TAB ONCE A MONTH 05/16/14  Yes Historical Provider, MD  levothyroxine (SYNTHROID, LEVOTHROID) 100 MCG tablet Take 100 mcg by mouth daily.     Yes Historical Provider, MD  lisinopril-hydrochlorothiazide (PRINZIDE,ZESTORETIC) 20-12.5 MG per tablet Take 1 tablet by mouth daily. TAKE 1 TAB DALY 08/15/14  Yes Historical Provider, MD  Multiple Vitamin (MULTIVITAMIN) tablet Take 1 tablet by mouth daily.     Yes Historical Provider, MD  omega-3 acid ethyl esters (LOVAZA) 1 G capsule Take 2 g by mouth daily.    Yes Historical Provider, MD  traMADol (ULTRAM) 50 MG tablet Take 50 mg by mouth 2 (two) times daily.  01/07/16  Yes Historical Provider, MD  XARELTO 20 MG TABS  tablet Take 20 mg by mouth daily with supper.  07/05/13  Yes Historical Provider, MD    Physical Exam: Vitals:   02/10/2016 0500 01/26/2016 0545 02/05/2016 0626 01/14/2016 0656  BP: 142/81 150/92  (!) 146/98  Pulse: 108 80  77  Resp: 20 17  18   Temp:   97.4 F (36.3 C) 97.3 F (36.3 C)  TempSrc:   Oral Axillary  SpO2: 98% 98%  99%  Weight:    87 kg (191 lb 12.8 oz)  Height:    6\' 1"  (1.854 m)      Constitutional: Moderately built and nourished. Vitals:   02/01/2016 0500 01/15/2016 0545 01/30/2016 0626 01/21/2016 0656  BP: 142/81 150/92  (!) 146/98  Pulse: 108 80  77  Resp: 20 17  18   Temp:   97.4 F (36.3 C) 97.3 F (36.3 C)  TempSrc:   Oral Axillary  SpO2: 98% 98%  99%  Weight:     87 kg (191 lb 12.8 oz)  Height:    6\' 1"  (1.854 m)   Eyes: Anicteric, no pallor. ENMT: No discharge from the ears eyes nose and mouth. Neck: No neck rigidity no mass felt. Respiratory: No rhonchi or crepitations. Cardiovascular: S1 and S2 heard. Abdomen: Soft nontender bowel sounds present. No guarding or rigidity. Musculoskeletal: No edema. Joint effusion. Skin: No rash. Skin appears warm. Neurologic: Alert awake oriented to his name place and person. Unable to move his left upper extremity. Left lower extremity is 4 x 5. Right upper and right lower extremity is 5 x 5. Right-sided preferential gaze. Right facial droop. Tongue is midline. Psychiatric: Appears normal. Normal affect.   Labs on Admission: I have personally reviewed following labs and imaging studies  CBC:  Recent Labs Lab 02/09/2016 0350 01/12/2016 0356  WBC 8.8  --   NEUTROABS 5.1  --   HGB 14.4 14.3  HCT 42.4 42.0  MCV 97.7  --   PLT 216  --    Basic Metabolic Panel:  Recent Labs Lab 01/31/2016 0350 01/12/2016 0356  NA 138 140  K 3.9 3.8  CL 103 104  CO2 26  --   GLUCOSE 155* 151*  BUN 9 12  CREATININE 0.92 0.90  CALCIUM 9.5  --    GFR: Estimated Creatinine Clearance: 71.5 mL/min (by C-G formula based on SCr of 0.9 mg/dL). Liver Function Tests:  Recent Labs Lab 01/22/2016 0350  AST 34  ALT 34  ALKPHOS 77  BILITOT 1.5*  PROT 7.2  ALBUMIN 4.1   No results for input(s): LIPASE, AMYLASE in the last 168 hours. No results for input(s): AMMONIA in the last 168 hours. Coagulation Profile:  Recent Labs Lab 01/19/2016 0350  INR 1.89   Cardiac Enzymes: No results for input(s): CKTOTAL, CKMB, CKMBINDEX, TROPONINI in the last 168 hours. BNP (last 3 results) No results for input(s): PROBNP in the last 8760 hours. HbA1C: No results for input(s): HGBA1C in the last 72 hours. CBG:  Recent Labs Lab 01/12/2016 0348  GLUCAP 143*   Lipid Profile: No results for input(s): CHOL, HDL, LDLCALC, TRIG,  CHOLHDL, LDLDIRECT in the last 72 hours. Thyroid Function Tests: No results for input(s): TSH, T4TOTAL, FREET4, T3FREE, THYROIDAB in the last 72 hours. Anemia Panel: No results for input(s): VITAMINB12, FOLATE, FERRITIN, TIBC, IRON, RETICCTPCT in the last 72 hours. Urine analysis: No results found for: COLORURINE, APPEARANCEUR, LABSPEC, PHURINE, GLUCOSEU, HGBUR, BILIRUBINUR, KETONESUR, PROTEINUR, UROBILINOGEN, NITRITE, LEUKOCYTESUR Sepsis Labs: @LABRCNTIP (procalcitonin:4,lacticidven:4) )No results found for this or any previous  visit (from the past 240 hour(s)).   Radiological Exams on Admission: Ct Angio Head W Or Wo Contrast  Result Date: 01-16-2016 CLINICAL DATA:  LEFT arm weakness, facial droop. History of hypertension and atrial fibrillation. Follow-up acute RIGHT MCA territory infarct. EXAM: CT ANGIOGRAPHY HEAD AND NECK TECHNIQUE: Multiphase CT imaging of the brain was performed following IV bolus contrast injection. Subsequent parametric perfusion maps were calculated using RAPID software.Multidetector CT imaging of the head and neck was performed using the standard protocol during bolus administration of intravenous contrast. Multiplanar CT image reconstructions and MIPs were obtained to evaluate the vascular anatomy. Carotid stenosis measurements (when applicable) are obtained utilizing NASCET criteria, using the distal internal carotid diameter as the denominator. CONTRAST:  90 cc Isovue 370 COMPARISON:  CT HEAD and cervical spine January 16, 2016 at 0355 hours CT HEAD 2016-01-16 at 0352 hours FINDINGS: CT Brain Perfusion Findings: CBF (<30%) Volume: Perfusion (Tmax>6.0s) volume: Mismatch Volume: 72mL Infarction Location:1.5 CTA NECK AORTIC ARCH: Normal appearance of the thoracic arch, normal branch pattern. Mild calcific atherosclerosis. The origins of the innominate, left Common carotid artery and subclavian artery are widely patent. RIGHT CAROTID SYSTEM: Common carotid  artery is widely patent, coursing in a straight line fashion. Minimal eccentric calcific atherosclerosis. Normal appearance of the carotid bifurcation without hemodynamically significant stenosis by NASCET criteria ; trace calcific atherosclerosis. Slight luminal irregularity of the distal RIGHT internal carotid artery attributed to atherosclerosis. LEFT CAROTID SYSTEM: Common carotid artery is widely patent, coursing in a straight line fashion. Normal appearance of the carotid bifurcation without hemodynamically significant stenosis by NASCET criteria, moderate calcific atherosclerosis. Normal appearance of the included internal carotid artery. VERTEBRAL ARTERIES:Left vertebral artery is dominant. Normal appearance of the vertebral arteries, which appear widely patent. Mild extrinsic deformity of the vertebral arteries due to degenerative cervical spine. SKELETON: No acute osseous process though bone windows have not been submitted. Please see CT of cervical spine from today, reported separately for dedicated findings. OTHER NECK: Soft tissues of the neck are non-acute though, not tailored for evaluation. CTA HEAD ANTERIOR CIRCULATION: Normal appearance of the cervical internal carotid arteries, petrous, cavernous and supra clinoid internal carotid arteries. Mild calcific atherosclerosis of the carotid siphons. Occluded proximal RIGHT M1 segment. Paucity of mid and distal vessels. Widely patent anterior communicating artery. Normal appearance of the anterior cerebral arteries. No large vessel occlusion, hemodynamically significant stenosis, dissection, luminal irregularity, contrast extravasation or aneurysm. POSTERIOR CIRCULATION: Normal appearance of the vertebral arteries, vertebrobasilar junction and basilar artery, as well as main branch vessels. Normal appearance of the posterior cerebral arteries. Moderate stenosis proximal RIGHT P3 segment. No large vessel occlusion, hemodynamically significant stenosis,  dissection, luminal irregularity, contrast extravasation or aneurysm. VENOUS SINUSES: Major dural venous sinuses are patent though not tailored for evaluation on this angiographic examination. ANATOMIC VARIANTS: Supernumerary anterior cerebral artery arising from LEFT A1-2 junction. DELAYED PHASE: Not performed. IMPRESSION: CT perfusion: Acute large RIGHT MCA territory infarct, with additional 72 cc penumbra/brain at risk. CTA NECK: Atherosclerosis without hemodynamically significant stenosis or acute vascular process. CTA HEAD:  Emergent RIGHT M1 large vessel occlusion. Moderate stenosis RIGHT P3 segment. Acute findings discussed with and reconfirmed by Dr.Lindzen, Neurology on 01-16-16 at 4:52 am. Electronically Signed   By: Awilda Metro M.D.   On: 01-16-2016 04:53   Ct Angio Neck W And/or Wo Contrast  Result Date: 01/16/16 CLINICAL DATA:  LEFT arm weakness, facial droop. History of hypertension and atrial fibrillation. Follow-up acute RIGHT MCA territory infarct.  EXAM: CT ANGIOGRAPHY HEAD AND NECK TECHNIQUE: Multiphase CT imaging of the brain was performed following IV bolus contrast injection. Subsequent parametric perfusion maps were calculated using RAPID software.Multidetector CT imaging of the head and neck was performed using the standard protocol during bolus administration of intravenous contrast. Multiplanar CT image reconstructions and MIPs were obtained to evaluate the vascular anatomy. Carotid stenosis measurements (when applicable) are obtained utilizing NASCET criteria, using the distal internal carotid diameter as the denominator. CONTRAST:  90 cc Isovue 370 COMPARISON:  CT HEAD and cervical spine January 13, 2016 at 0355 hours CT HEAD January 13, 2016 at 0352 hours FINDINGS: CT Brain Perfusion Findings: CBF (<30%) Volume: 156mL Perfusion (Tmax>6.0s) volume: 228mL Mismatch Volume: 72mL Infarction Location:1.5 CTA NECK AORTIC ARCH: Normal appearance of the thoracic arch, normal branch  pattern. Mild calcific atherosclerosis. The origins of the innominate, left Common carotid artery and subclavian artery are widely patent. RIGHT CAROTID SYSTEM: Common carotid artery is widely patent, coursing in a straight line fashion. Minimal eccentric calcific atherosclerosis. Normal appearance of the carotid bifurcation without hemodynamically significant stenosis by NASCET criteria ; trace calcific atherosclerosis. Slight luminal irregularity of the distal RIGHT internal carotid artery attributed to atherosclerosis. LEFT CAROTID SYSTEM: Common carotid artery is widely patent, coursing in a straight line fashion. Normal appearance of the carotid bifurcation without hemodynamically significant stenosis by NASCET criteria, moderate calcific atherosclerosis. Normal appearance of the included internal carotid artery. VERTEBRAL ARTERIES:Left vertebral artery is dominant. Normal appearance of the vertebral arteries, which appear widely patent. Mild extrinsic deformity of the vertebral arteries due to degenerative cervical spine. SKELETON: No acute osseous process though bone windows have not been submitted. Please see CT of cervical spine from today, reported separately for dedicated findings. OTHER NECK: Soft tissues of the neck are non-acute though, not tailored for evaluation. CTA HEAD ANTERIOR CIRCULATION: Normal appearance of the cervical internal carotid arteries, petrous, cavernous and supra clinoid internal carotid arteries. Mild calcific atherosclerosis of the carotid siphons. Occluded proximal RIGHT M1 segment. Paucity of mid and distal vessels. Widely patent anterior communicating artery. Normal appearance of the anterior cerebral arteries. No large vessel occlusion, hemodynamically significant stenosis, dissection, luminal irregularity, contrast extravasation or aneurysm. POSTERIOR CIRCULATION: Normal appearance of the vertebral arteries, vertebrobasilar junction and basilar artery, as well as main branch  vessels. Normal appearance of the posterior cerebral arteries. Moderate stenosis proximal RIGHT P3 segment. No large vessel occlusion, hemodynamically significant stenosis, dissection, luminal irregularity, contrast extravasation or aneurysm. VENOUS SINUSES: Major dural venous sinuses are patent though not tailored for evaluation on this angiographic examination. ANATOMIC VARIANTS: Supernumerary anterior cerebral artery arising from LEFT A1-2 junction. DELAYED PHASE: Not performed. IMPRESSION: CT perfusion: Acute large RIGHT MCA territory infarct, with additional 72 cc penumbra/brain at risk. CTA NECK: Atherosclerosis without hemodynamically significant stenosis or acute vascular process. CTA HEAD:  Emergent RIGHT M1 large vessel occlusion. Moderate stenosis RIGHT P3 segment. Acute findings discussed with and reconfirmed by Dr.Lindzen, Neurology on 01/21/2016 at 4:52 am. Electronically Signed   By: Awilda Metroourtnay  Bloomer M.D.   On: 01/16/2016 04:53   Ct Cervical Spine Wo Contrast  Result Date: 02/08/2016 CLINICAL DATA:  Code stroke. LEFT arm weakness, facial droop. History of hypertension and atrial fibrillation. EXAM: CT HEAD WITHOUT CONTRAST CT CERVICAL SPINE WITHOUT CONTRAST TECHNIQUE: Multidetector CT imaging of the head and cervical spine was performed following the standard protocol without intravenous contrast. Multiplanar CT image reconstructions of the cervical spine were also generated. COMPARISON:  None. FINDINGS: CT HEAD FINDINGS BRAIN: Blurring  of the RIGHT frontal lobe and posterior insula gray-white matter differentiation. The ventricles and sulci are normal for age. No intraparenchymal hemorrhage, mass effect nor midline shift. Patchy supratentorial white matter hypodensities within normal range for patient's age, though non-specific are most compatible with chronic small vessel ischemic disease. No abnormal extra-axial fluid collections. Basal cisterns are patent. VASCULAR: Dense RIGHT middle cerebral  artery. Mild calcific atherosclerosis the carotid siphons. SKULL: No skull fracture. No significant scalp soft tissue swelling. SINUSES/ORBITS: Mild paranasal sinus mucosal thickening. Mastoid air cells are well aerated.The included ocular globes and orbital contents are non-suspicious. OTHER: None. ASPECTS [facility] Stroke Program Early CT Score) - Ganglionic level infarction (caudate, lentiform nuclei, internal capsule, insula, M1-M3 cortex): 5 - Supraganglionic infarction (M4-M6 cortex): 2 Total score (0-10 with 10 being normal): 7 CT CERVICAL SPINE FINDINGS ALIGNMENT: Maintained lordosis. Minimal grade 1 C5-6 anterolisthesis. Mild broad dextroscoliosis could be positional. SKULL BASE AND VERTEBRAE: Cervical vertebral bodies and posterior elements are intact. Intervertebral disc heights preserved. Multilevel severe LEFT facet arthropathy. No destructive bony lesions. C1-2 articulation maintained. SOFT TISSUES AND SPINAL CANAL: Nonacute. Mild calcific atherosclerosis of the carotid bulbs. DISC LEVELS: No significant osseous canal stenosis. Severe LEFT C3-4 through C5-6 neural foraminal narrowing. UPPER CHEST: Fibronodular scarring RIGHT lung apex with calcified granuloma. OTHER: None. IMPRESSION: 1. Acute multifocal small to moderate MCA territory infarct, no hemorrhage. Dense RIGHT middle cerebral artery consistent with thromboembolism. 2. ASPECTS is 7. 3. No acute cervical spine fracture or malalignment. Severe LEFT C3-4 through C5-6 neural foraminal narrowing. Critical Value/emergent results were called by telephone at the time of interpretation on 01/31/2016 at 4:00 am to Dr. Otelia Limes, NeurologyADELEKE ONI , who verbally acknowledged these results. Electronically Signed   By: Awilda Metro M.D.   On: 01/12/2016 04:10   Ct Cerebral Perfusion W Contrast  Result Date: 01/23/2016 CLINICAL DATA:  LEFT arm weakness, facial droop. History of hypertension and atrial fibrillation. Follow-up acute RIGHT MCA territory  infarct. EXAM: CT ANGIOGRAPHY HEAD AND NECK TECHNIQUE: Multiphase CT imaging of the brain was performed following IV bolus contrast injection. Subsequent parametric perfusion maps were calculated using RAPID software.Multidetector CT imaging of the head and neck was performed using the standard protocol during bolus administration of intravenous contrast. Multiplanar CT image reconstructions and MIPs were obtained to evaluate the vascular anatomy. Carotid stenosis measurements (when applicable) are obtained utilizing NASCET criteria, using the distal internal carotid diameter as the denominator. CONTRAST:  90 cc Isovue 370 COMPARISON:  CT HEAD and cervical spine January 13, 2016 at 0355 hours CT HEAD January 13, 2016 at 0352 hours FINDINGS: CT Brain Perfusion Findings: CBF (<30%) Volume: Perfusion (Tmax>6.0s) volume: Mismatch Volume: 72mL Infarction Location:1.5 CTA NECK AORTIC ARCH: Normal appearance of the thoracic arch, normal branch pattern. Mild calcific atherosclerosis. The origins of the innominate, left Common carotid artery and subclavian artery are widely patent. RIGHT CAROTID SYSTEM: Common carotid artery is widely patent, coursing in a straight line fashion. Minimal eccentric calcific atherosclerosis. Normal appearance of the carotid bifurcation without hemodynamically significant stenosis by NASCET criteria ; trace calcific atherosclerosis. Slight luminal irregularity of the distal RIGHT internal carotid artery attributed to atherosclerosis. LEFT CAROTID SYSTEM: Common carotid artery is widely patent, coursing in a straight line fashion. Normal appearance of the carotid bifurcation without hemodynamically significant stenosis by NASCET criteria, moderate calcific atherosclerosis. Normal appearance of the included internal carotid artery. VERTEBRAL ARTERIES:Left vertebral artery is dominant. Normal appearance of the vertebral arteries, which appear widely patent. Mild extrinsic deformity  of the  vertebral arteries due to degenerative cervical spine. SKELETON: No acute osseous process though bone windows have not been submitted. Please see CT of cervical spine from today, reported separately for dedicated findings. OTHER NECK: Soft tissues of the neck are non-acute though, not tailored for evaluation. CTA HEAD ANTERIOR CIRCULATION: Normal appearance of the cervical internal carotid arteries, petrous, cavernous and supra clinoid internal carotid arteries. Mild calcific atherosclerosis of the carotid siphons. Occluded proximal RIGHT M1 segment. Paucity of mid and distal vessels. Widely patent anterior communicating artery. Normal appearance of the anterior cerebral arteries. No large vessel occlusion, hemodynamically significant stenosis, dissection, luminal irregularity, contrast extravasation or aneurysm. POSTERIOR CIRCULATION: Normal appearance of the vertebral arteries, vertebrobasilar junction and basilar artery, as well as main branch vessels. Normal appearance of the posterior cerebral arteries. Moderate stenosis proximal RIGHT P3 segment. No large vessel occlusion, hemodynamically significant stenosis, dissection, luminal irregularity, contrast extravasation or aneurysm. VENOUS SINUSES: Major dural venous sinuses are patent though not tailored for evaluation on this angiographic examination. ANATOMIC VARIANTS: Supernumerary anterior cerebral artery arising from LEFT A1-2 junction. DELAYED PHASE: Not performed. IMPRESSION: CT perfusion: Acute large RIGHT MCA territory infarct, with additional 72 cc penumbra/brain at risk. CTA NECK: Atherosclerosis without hemodynamically significant stenosis or acute vascular process. CTA HEAD:  Emergent RIGHT M1 large vessel occlusion. Moderate stenosis RIGHT P3 segment. Acute findings discussed with and reconfirmed by Dr.Lindzen, Neurology on 01-23-2016 at 4:52 am. Electronically Signed   By: Awilda Metro M.D.   On: 01/23/2016 04:53   Ct Head Code Stroke W/o  Cm  Result Date: 01-23-2016 CLINICAL DATA:  Code stroke. LEFT arm weakness, facial droop. History of hypertension and atrial fibrillation. EXAM: CT HEAD WITHOUT CONTRAST CT CERVICAL SPINE WITHOUT CONTRAST TECHNIQUE: Multidetector CT imaging of the head and cervical spine was performed following the standard protocol without intravenous contrast. Multiplanar CT image reconstructions of the cervical spine were also generated. COMPARISON:  None. FINDINGS: CT HEAD FINDINGS BRAIN: Blurring of the RIGHT frontal lobe and posterior insula gray-white matter differentiation. The ventricles and sulci are normal for age. No intraparenchymal hemorrhage, mass effect nor midline shift. Patchy supratentorial white matter hypodensities within normal range for patient's age, though non-specific are most compatible with chronic small vessel ischemic disease. No abnormal extra-axial fluid collections. Basal cisterns are patent. VASCULAR: Dense RIGHT middle cerebral artery. Mild calcific atherosclerosis the carotid siphons. SKULL: No skull fracture. No significant scalp soft tissue swelling. SINUSES/ORBITS: Mild paranasal sinus mucosal thickening. Mastoid air cells are well aerated.The included ocular globes and orbital contents are non-suspicious. OTHER: None. ASPECTS [facility] Stroke Program Early CT Score) - Ganglionic level infarction (caudate, lentiform nuclei, internal capsule, insula, M1-M3 cortex): 5 - Supraganglionic infarction (M4-M6 cortex): 2 Total score (0-10 with 10 being normal): 7 CT CERVICAL SPINE FINDINGS ALIGNMENT: Maintained lordosis. Minimal grade 1 C5-6 anterolisthesis. Mild broad dextroscoliosis could be positional. SKULL BASE AND VERTEBRAE: Cervical vertebral bodies and posterior elements are intact. Intervertebral disc heights preserved. Multilevel severe LEFT facet arthropathy. No destructive bony lesions. C1-2 articulation maintained. SOFT TISSUES AND SPINAL CANAL: Nonacute. Mild calcific atherosclerosis of  the carotid bulbs. DISC LEVELS: No significant osseous canal stenosis. Severe LEFT C3-4 through C5-6 neural foraminal narrowing. UPPER CHEST: Fibronodular scarring RIGHT lung apex with calcified granuloma. OTHER: None. IMPRESSION: 1. Acute multifocal small to moderate MCA territory infarct, no hemorrhage. Dense RIGHT middle cerebral artery consistent with thromboembolism. 2. ASPECTS is 7. 3. No acute cervical spine fracture or malalignment. Severe LEFT C3-4 through C5-6 neural foraminal  narrowing. Critical Value/emergent results were called by telephone at the time of interpretation on 01/21/2016 at 4:00 am to Dr. Otelia Limes, NeurologyADELEKE ONI , who verbally acknowledged these results. Electronically Signed   By: Awilda Metro M.D.   On: 01/24/2016 04:10    EKG: Independently reviewed. Atrial fibrillation rate around 120 beats per minute.  Assessment/Plan Principal Problem:   Acute ischemic right MCA stroke (HCC) Active Problems:   ATRIAL FIBRILLATION   Essential hypertension   Hypothyroidism   Stroke (cerebrum) (HCC)    1. Acute ischemic stroke right MCA - discussed with Dr. Otelia Limes. At this time stroke team will decide if patient needs to be continued anticoagulation. Patient has failed swallow and speech therapist has been consulted. Get physical therapy consult. Check 2-D echo and MRI brain. Lipid panel and hemoglobin A1c are pending. 2. Chronic atrial fibrillation - chads 2 vasc score of 5. Regarding anticoagulation see #1. Heart rate was elevated on admission but has improved at this time. 3. Hypertension - allow for permissive hypertension. When necessary IV hydralazine for systolic blood pressure more than 220 and diastolic more than 120. 4. Hypothyroidism - IV Synthroid until patient can take orally. 5. History of gout.   DVT prophylaxis: SCDs. Patient did take Xarelto last night. Code Status: Full code.  Family Communication: Discussed with patient's wife.  Disposition Plan: To  be determined.  Consults called: Neurologist.  Admission status: Inpatient.    Eduard Clos MD [facility] Pager [phone].  If 7PM-7AM, please contact night-coverage [email or web address] Password [email or web address]  01/20/2016, 7:06 AM

## 2016-01-13 NOTE — Evaluation (Signed)
Physical Therapy Evaluation Patient Details Name: Jerry [name] MRN: [id number] DOB: 1931/10/23 Today's Date: 01/24/2016   History of Present Illness  pt presents with R Ischemic MCA Infarct.  pt with hx of A-fib, HTN, and Gout.    Clinical Impression  Pt oriented to location and situation, and was even able to tell PT why he was not being given anything to drink right now.  Pt motivated for mobility and does give good effort, just delayed with following commands.  During mobility pt's HR increased to a max of 158 nonsustained, but with rest in supine returns to 90s - 110s.  Feel pt would be a great candidate for CIR level of therapies to maximize independence and decrease burden of care.  Will continue to follow.      Follow Up Recommendations CIR    Equipment Recommendations  None recommended by PT    Recommendations for Other Services Rehab consult     Precautions / Restrictions Precautions Precautions: Fall Precaution Comments: Watch HR Restrictions Weight Bearing Restrictions: No      Mobility  Bed Mobility Overal bed mobility: Needs Assistance Bed Mobility: Supine to Sit;Sit to Supine     Supine to sit: Mod assist;+2 for physical assistance Sit to supine: Mod assist;+2 for physical assistance   General bed mobility comments: With increased time pt does attempt to initiate movement, though tries to sit straight up to long sitting.  With facilitation pt is able to bring LEs to EOB and come to sitting.  pt denies dizziness, however HR increased to 149nonsustained with effort of bed mobility.    Transfers Overall transfer level: Needs assistance Equipment used: 2 person hand held assist Transfers: Sit to/from Stand Sit to Stand: Mod assist;+2 physical assistance         General transfer comment: pt needs Bil feet blocked and facilitating balance over BOS.  pt leans heavily to L side and posteriorly.  HR up to 158 nonsustained.    Ambulation/Gait                 Stairs            Wheelchair Mobility    Modified Rankin (Stroke Patients Only) Modified Rankin (Stroke Patients Only) Pre-Morbid Rankin Score: No symptoms Modified Rankin: Severe disability     Balance Overall balance assessment: Needs assistance Sitting-balance support: Single extremity supported;No upper extremity supported;Feet supported Sitting balance-Leahy Scale: Poor Sitting balance - Comments: pt lenas to L and posteriorly.  R UE not always used functionally to maintain balance.   Postural control: Posterior lean;Left lateral lean Standing balance support: During functional activity Standing balance-Leahy Scale: Zero                               Pertinent Vitals/Pain Pain Assessment: No/denies pain    Home Living Family/patient expects to be discharged to:: Inpatient rehab                      Prior Function Level of Independence: Independent         Comments: Normally a very active and sharp 83 y/o.       Hand Dominance        Extremity/Trunk Assessment   Upper Extremity Assessment Upper Extremity Assessment: Defer to OT evaluation    Lower Extremity Assessment Lower Extremity Assessment: LLE deficits/detail LLE Deficits / Details: Sensation grossly diminished to soft touch and absent pain sensation.  pt actively moving L LE to command ROM intact, strength grossly 4/5.   LLE Sensation: decreased light touch;decreased proprioception LLE Coordination: decreased fine motor    Cervical / Trunk Assessment Cervical / Trunk Assessment: Kyphotic  Communication   Communication: Expressive difficulties (a little dysarthric?)  Cognition Arousal/Alertness: Lethargic Behavior During Therapy: Flat affect Overall Cognitive Status: Impaired/Different from baseline Area of Impairment: Attention;Following commands;Safety/judgement;Awareness;Problem solving   Current Attention Level: Sustained   Following Commands: Follows one  step commands with increased time;Follows multi-step commands inconsistently Safety/Judgement: Decreased awareness of deficits Awareness: Intellectual Problem Solving: Slow processing;Difficulty sequencing;Requires tactile cues;Requires verbal cues General Comments: pt is oriented and even recalls why he is not being given anything to drink.  Overall slow processing and initiating tasks even on R side.      General Comments      Exercises     Assessment/Plan    PT Assessment Patient needs continued PT services  PT Problem List Decreased strength;Decreased activity tolerance;Decreased balance;Decreased mobility;Decreased coordination;Decreased cognition;Decreased knowledge of use of DME;Decreased safety awareness;Cardiopulmonary status limiting activity;Impaired sensation          PT Treatment Interventions DME instruction;Gait training;Functional mobility training;Therapeutic activities;Therapeutic exercise;Balance training;Neuromuscular re-education;Cognitive remediation;Patient/family education    PT Goals (Current goals can be found in the Care Plan section)  Acute Rehab PT Goals Patient Stated Goal: Per wife to get back to normal PT Goal Formulation: With patient/family Time For Goal Achievement: 01/27/16 Potential to Achieve Goals: Good    Frequency Min 4X/week   Barriers to discharge        Co-evaluation               End of Session Equipment Utilized During Treatment: Gait belt;Oxygen Activity Tolerance: Patient tolerated treatment well Patient left: in bed;with call bell/phone within reach;with bed alarm set;with family/visitor present Nurse Communication: Mobility status (HR)         Time: [id number] PT Time Calculation (min) (ACUTE ONLY): 29 min   Charges:   PT Evaluation $PT Eval Moderate Complexity: 1 Procedure PT Treatments $Therapeutic Activity: 8-22 mins   PT G CodesSunny [name]:        Jerry [name], [place]  [phone] 01/26/2016, 12:11 PM

## 2016-01-13 NOTE — ED Provider Notes (Signed)
[facility]-EMERGENCY DEPT Provider Note   CSN: [id number] Arrival date & time: 02/11/2016  0346  By signing my name below, I, Rosario Adie, attest that this documentation has been prepared under the direction and in the presence of Tomasita Crumble, MD. Electronically Signed: Rosario Adie, ED Scribe. 02/11/2016. 3:53 AM.  History   Chief Complaint Chief Complaint  Patient presents with  . Code Stroke   LEVEL V CAVEAT: HPI and ROS limited due to mental status change   The history is provided by the patient. No language interpreter was used.    HPI Comments: Jerry [name] is a 83 y.o. male BIB EMS, with a PMHx of HTN, HLD, and AFIB, who presents to the Emergency Department as a code stroke. Last known normal: 11:30PM on 01/12/16. Per EMS, his wife last saw him just prior to him going to bed at 11:30PM last night. However, tonight just prior to arrival his wife heard a thud from the pt's bedroom, and upon entering his room she found him on the floor beside his bed. She noted that his speech did seem more slurred, and with left-sided paralysis. Pt is currently anticoagulated on Xarelto therapy daily d/t his h/o AFIB. EMS denies any emesis events while en route.   Past Medical History:  Diagnosis Date  . Dyslipidemia   . Gout   . HTN (hypertension)   . Hypothyroidism   . Paroxysmal atrial fibrillation [facility])    Patient Active Problem List   Diagnosis Date Noted  . Acute right MCA stroke (HCC) 02-11-16  . Dyslipidemia   . HTN (hypertension)   . Hypothyroidism   . ATRIAL FIBRILLATION 12/12/2009  . DIARRHEA 12/12/2009  . PERSONAL HX COLONIC POLYPS 12/12/2009   Past Surgical History:  Procedure Laterality Date  . TONSILLECTOMY AND ADENOIDECTOMY  AGE 73    Home Medications    Prior to Admission medications   Medication Sig Start Date End Date Taking? Authorizing Provider  alfuzosin (UROXATRAL) 10 MG 24 hr tablet Take 10 mg by mouth daily. 12/15/15  Yes Historical Provider, MD    allopurinol (ZYLOPRIM) 300 MG tablet Take 300 mg by mouth daily.     Yes Historical Provider, MD  atorvastatin (LIPITOR) 40 MG tablet Take 40 mg by mouth daily.     Yes Historical Provider, MD  CALCIUM PO Take 1 tablet by mouth daily.    Yes Historical Provider, MD  Cyanocobalamin (VITAMIN B-12 PO) Take 1 tablet by mouth daily.   Yes Historical Provider, MD  digoxin (LANOXIN) 0.125 MG tablet Take 0.125 mg by mouth daily.   Yes Historical Provider, MD  ibandronate (BONIVA) 150 MG tablet Take 1 tablet by mouth every 30 (thirty) days. TAKE 1 TAB ONCE A MONTH 05/16/14  Yes Historical Provider, MD  levothyroxine (SYNTHROID, LEVOTHROID) 100 MCG tablet Take 100 mcg by mouth daily.     Yes Historical Provider, MD  lisinopril-hydrochlorothiazide (PRINZIDE,ZESTORETIC) 20-12.5 MG per tablet Take 1 tablet by mouth daily. TAKE 1 TAB DALY 08/15/14  Yes Historical Provider, MD  Multiple Vitamin (MULTIVITAMIN) tablet Take 1 tablet by mouth daily.     Yes Historical Provider, MD  omega-3 acid ethyl esters (LOVAZA) 1 G capsule Take 2 g by mouth daily.    Yes Historical Provider, MD  traMADol (ULTRAM) 50 MG tablet Take 50 mg by mouth 2 (two) times daily.  01/07/16  Yes Historical Provider, MD  XARELTO 20 MG TABS tablet Take 20 mg by mouth daily with supper.  07/05/13  Yes Historical Provider, MD   Family History Family History  Problem Relation Age of Onset  . Stroke Father   . Hypertension Father   . Other Mother     CARDIOMEGALY  . Hypertension Mother   . Lung cancer Mother   . Diabetes Brother   . COPD Child    Social History Social History  Substance Use Topics  . Smoking status: Never Smoker  . Smokeless tobacco: Never Used  . Alcohol use No   Allergies   Cardizem [diltiazem hcl]  Review of Systems Review of Systems  Unable to perform ROS: Mental status change   Physical Exam Updated Vital Signs BP (!) 145/103   Pulse 103   Temp (!) 94.4 F (34.7 C) (Axillary)   Resp 20   Wt 192 lb 5 oz  (87.2 kg)   SpO2 95%   BMI 25.37 kg/m   Physical Exam  Constitutional: He is oriented to person, place, and time. Vital signs are normal. He appears well-developed and well-nourished.  Non-toxic appearance. He does not appear ill. No distress.  HENT:  Head: Normocephalic and atraumatic.  Nose: Nose normal.  Mouth/Throat: Oropharynx is clear and moist. No oropharyngeal exudate.  Eyes: Conjunctivae and EOM are normal. Pupils are equal, round, and reactive to light. No scleral icterus.  Neck: Normal range of motion. Neck supple. No tracheal deviation, no edema, no erythema and normal range of motion present. No thyroid mass and no thyromegaly present.  Cardiovascular: Normal rate, regular rhythm, S1 normal, S2 normal, normal heart sounds, intact distal pulses and normal pulses.  Exam reveals no gallop and no friction rub.   No murmur heard. Pulmonary/Chest: Effort normal and breath sounds normal. No respiratory distress. He has no wheezes. He has no rhonchi. He has no rales.  Abdominal: Soft. Normal appearance and bowel sounds are normal. He exhibits no distension, no ascites and no mass. There is no hepatosplenomegaly. There is no tenderness. There is no rebound, no guarding and no CVA tenderness.  Musculoskeletal: Normal range of motion. He exhibits no edema or tenderness.  Lymphadenopathy:    He has no cervical adenopathy.  Neurological: He is alert and oriented to person, place, and time. He has normal strength. No cranial nerve deficit or sensory deficit.  Skin: Skin is warm, dry and intact. No petechiae and no rash noted. He is not diaphoretic. No erythema. No pallor.  Nursing note and vitals reviewed.  ED Treatments / Results  DIAGNOSTIC STUDIES: Oxygen Saturation is 100% on RA, normal by my interpretation.   Labs (all labs ordered are listed, but only abnormal results are displayed) Labs Reviewed  PROTIME-INR - Abnormal; Notable for the following:       Result Value    Prothrombin Time 22.0 (*)    All other components within normal limits  APTT - Abnormal; Notable for the following:    aPTT 39 (*)    All other components within normal limits  COMPREHENSIVE METABOLIC PANEL - Abnormal; Notable for the following:    Glucose, Bld 155 (*)    Total Bilirubin 1.5 (*)    All other components within normal limits  CBG MONITORING, ED - Abnormal; Notable for the following:    Glucose-Capillary 143 (*)    All other components within normal limits  I-STAT CHEM 8, ED - Abnormal; Notable for the following:    Glucose, Bld 151 (*)    All other components within normal limits  CBC  DIFFERENTIAL  I-STAT TROPOININ, ED  EKG  EKG Interpretation None      Radiology Ct Angio Head W Or Wo Contrast  Result Date: 01/17/2016 CLINICAL DATA:  LEFT arm weakness, facial droop. History of hypertension and atrial fibrillation. Follow-up acute RIGHT MCA territory infarct. EXAM: CT ANGIOGRAPHY HEAD AND NECK TECHNIQUE: Multiphase CT imaging of the brain was performed following IV bolus contrast injection. Subsequent parametric perfusion maps were calculated using RAPID software.Multidetector CT imaging of the head and neck was performed using the standard protocol during bolus administration of intravenous contrast. Multiplanar CT image reconstructions and MIPs were obtained to evaluate the vascular anatomy. Carotid stenosis measurements (when applicable) are obtained utilizing NASCET criteria, using the distal internal carotid diameter as the denominator. CONTRAST:  90 cc Isovue 370 COMPARISON:  CT HEAD and cervical spine January 13, 2016 at 0355 hours CT HEAD January 13, 2016 at 0352 hours FINDINGS: CT Brain Perfusion Findings: CBF (<30%) Volume: Perfusion (Tmax>6.0s) volume: Mismatch Volume: 72mL Infarction Location:1.5 CTA NECK AORTIC ARCH: Normal appearance of the thoracic arch, normal branch pattern. Mild calcific atherosclerosis. The origins of the innominate, left Common  carotid artery and subclavian artery are widely patent. RIGHT CAROTID SYSTEM: Common carotid artery is widely patent, coursing in a straight line fashion. Minimal eccentric calcific atherosclerosis. Normal appearance of the carotid bifurcation without hemodynamically significant stenosis by NASCET criteria ; trace calcific atherosclerosis. Slight luminal irregularity of the distal RIGHT internal carotid artery attributed to atherosclerosis. LEFT CAROTID SYSTEM: Common carotid artery is widely patent, coursing in a straight line fashion. Normal appearance of the carotid bifurcation without hemodynamically significant stenosis by NASCET criteria, moderate calcific atherosclerosis. Normal appearance of the included internal carotid artery. VERTEBRAL ARTERIES:Left vertebral artery is dominant. Normal appearance of the vertebral arteries, which appear widely patent. Mild extrinsic deformity of the vertebral arteries due to degenerative cervical spine. SKELETON: No acute osseous process though bone windows have not been submitted. Please see CT of cervical spine from today, reported separately for dedicated findings. OTHER NECK: Soft tissues of the neck are non-acute though, not tailored for evaluation. CTA HEAD ANTERIOR CIRCULATION: Normal appearance of the cervical internal carotid arteries, petrous, cavernous and supra clinoid internal carotid arteries. Mild calcific atherosclerosis of the carotid siphons. Occluded proximal RIGHT M1 segment. Paucity of mid and distal vessels. Widely patent anterior communicating artery. Normal appearance of the anterior cerebral arteries. No large vessel occlusion, hemodynamically significant stenosis, dissection, luminal irregularity, contrast extravasation or aneurysm. POSTERIOR CIRCULATION: Normal appearance of the vertebral arteries, vertebrobasilar junction and basilar artery, as well as main branch vessels. Normal appearance of the posterior cerebral arteries. Moderate stenosis  proximal RIGHT P3 segment. No large vessel occlusion, hemodynamically significant stenosis, dissection, luminal irregularity, contrast extravasation or aneurysm. VENOUS SINUSES: Major dural venous sinuses are patent though not tailored for evaluation on this angiographic examination. ANATOMIC VARIANTS: Supernumerary anterior cerebral artery arising from LEFT A1-2 junction. DELAYED PHASE: Not performed. IMPRESSION: CT perfusion: Acute large RIGHT MCA territory infarct, with additional 72 cc penumbra/brain at risk. CTA NECK: Atherosclerosis without hemodynamically significant stenosis or acute vascular process. CTA HEAD:  Emergent RIGHT M1 large vessel occlusion. Moderate stenosis RIGHT P3 segment. Acute findings discussed with and reconfirmed by Dr.Lindzen, Neurology on 01/19/2016 at 4:52 am. Electronically Signed   By: Awilda Metro M.D.   On: 02/11/2016 04:53   Ct Angio Neck W And/or Wo Contrast  Result Date: 02/05/2016 CLINICAL DATA:  LEFT arm weakness, facial droop. History of hypertension and atrial fibrillation. Follow-up acute RIGHT MCA territory infarct. EXAM:  CT ANGIOGRAPHY HEAD AND NECK TECHNIQUE: Multiphase CT imaging of the brain was performed following IV bolus contrast injection. Subsequent parametric perfusion maps were calculated using RAPID software.Multidetector CT imaging of the head and neck was performed using the standard protocol during bolus administration of intravenous contrast. Multiplanar CT image reconstructions and MIPs were obtained to evaluate the vascular anatomy. Carotid stenosis measurements (when applicable) are obtained utilizing NASCET criteria, using the distal internal carotid diameter as the denominator. CONTRAST:  90 cc Isovue 370 COMPARISON:  CT HEAD and cervical spine 01/19/2016 at 0355 hours CT HEAD 01/19/16 at 0352 hours FINDINGS: CT Brain Perfusion Findings: CBF (<30%) Volume: Perfusion (Tmax>6.0s) volume: Mismatch Volume: 72mL Infarction  Location:1.5 CTA NECK AORTIC ARCH: Normal appearance of the thoracic arch, normal branch pattern. Mild calcific atherosclerosis. The origins of the innominate, left Common carotid artery and subclavian artery are widely patent. RIGHT CAROTID SYSTEM: Common carotid artery is widely patent, coursing in a straight line fashion. Minimal eccentric calcific atherosclerosis. Normal appearance of the carotid bifurcation without hemodynamically significant stenosis by NASCET criteria ; trace calcific atherosclerosis. Slight luminal irregularity of the distal RIGHT internal carotid artery attributed to atherosclerosis. LEFT CAROTID SYSTEM: Common carotid artery is widely patent, coursing in a straight line fashion. Normal appearance of the carotid bifurcation without hemodynamically significant stenosis by NASCET criteria, moderate calcific atherosclerosis. Normal appearance of the included internal carotid artery. VERTEBRAL ARTERIES:Left vertebral artery is dominant. Normal appearance of the vertebral arteries, which appear widely patent. Mild extrinsic deformity of the vertebral arteries due to degenerative cervical spine. SKELETON: No acute osseous process though bone windows have not been submitted. Please see CT of cervical spine from today, reported separately for dedicated findings. OTHER NECK: Soft tissues of the neck are non-acute though, not tailored for evaluation. CTA HEAD ANTERIOR CIRCULATION: Normal appearance of the cervical internal carotid arteries, petrous, cavernous and supra clinoid internal carotid arteries. Mild calcific atherosclerosis of the carotid siphons. Occluded proximal RIGHT M1 segment. Paucity of mid and distal vessels. Widely patent anterior communicating artery. Normal appearance of the anterior cerebral arteries. No large vessel occlusion, hemodynamically significant stenosis, dissection, luminal irregularity, contrast extravasation or aneurysm. POSTERIOR CIRCULATION: Normal appearance of  the vertebral arteries, vertebrobasilar junction and basilar artery, as well as main branch vessels. Normal appearance of the posterior cerebral arteries. Moderate stenosis proximal RIGHT P3 segment. No large vessel occlusion, hemodynamically significant stenosis, dissection, luminal irregularity, contrast extravasation or aneurysm. VENOUS SINUSES: Major dural venous sinuses are patent though not tailored for evaluation on this angiographic examination. ANATOMIC VARIANTS: Supernumerary anterior cerebral artery arising from LEFT A1-2 junction. DELAYED PHASE: Not performed. IMPRESSION: CT perfusion: Acute large RIGHT MCA territory infarct, with additional 72 cc penumbra/brain at risk. CTA NECK: Atherosclerosis without hemodynamically significant stenosis or acute vascular process. CTA HEAD:  Emergent RIGHT M1 large vessel occlusion. Moderate stenosis RIGHT P3 segment. Acute findings discussed with and reconfirmed by Dr.Lindzen, Neurology on 01/19/16 at 4:52 am. Electronically Signed   By: Awilda Metro M.D.   On: 2016/01/19 04:53   Ct Cervical Spine Wo Contrast  Result Date: January 19, 2016 CLINICAL DATA:  Code stroke. LEFT arm weakness, facial droop. History of hypertension and atrial fibrillation. EXAM: CT HEAD WITHOUT CONTRAST CT CERVICAL SPINE WITHOUT CONTRAST TECHNIQUE: Multidetector CT imaging of the head and cervical spine was performed following the standard protocol without intravenous contrast. Multiplanar CT image reconstructions of the cervical spine were also generated. COMPARISON:  None. FINDINGS: CT HEAD FINDINGS BRAIN: Blurring of  the RIGHT frontal lobe and posterior insula gray-white matter differentiation. The ventricles and sulci are normal for age. No intraparenchymal hemorrhage, mass effect nor midline shift. Patchy supratentorial white matter hypodensities within normal range for patient's age, though non-specific are most compatible with chronic small vessel ischemic disease. No abnormal  extra-axial fluid collections. Basal cisterns are patent. VASCULAR: Dense RIGHT middle cerebral artery. Mild calcific atherosclerosis the carotid siphons. SKULL: No skull fracture. No significant scalp soft tissue swelling. SINUSES/ORBITS: Mild paranasal sinus mucosal thickening. Mastoid air cells are well aerated.The included ocular globes and orbital contents are non-suspicious. OTHER: None. ASPECTS [facility] Stroke Program Early CT Score) - Ganglionic level infarction (caudate, lentiform nuclei, internal capsule, insula, M1-M3 cortex): 5 - Supraganglionic infarction (M4-M6 cortex): 2 Total score (0-10 with 10 being normal): 7 CT CERVICAL SPINE FINDINGS ALIGNMENT: Maintained lordosis. Minimal grade 1 C5-6 anterolisthesis. Mild broad dextroscoliosis could be positional. SKULL BASE AND VERTEBRAE: Cervical vertebral bodies and posterior elements are intact. Intervertebral disc heights preserved. Multilevel severe LEFT facet arthropathy. No destructive bony lesions. C1-2 articulation maintained. SOFT TISSUES AND SPINAL CANAL: Nonacute. Mild calcific atherosclerosis of the carotid bulbs. DISC LEVELS: No significant osseous canal stenosis. Severe LEFT C3-4 through C5-6 neural foraminal narrowing. UPPER CHEST: Fibronodular scarring RIGHT lung apex with calcified granuloma. OTHER: None. IMPRESSION: 1. Acute multifocal small to moderate MCA territory infarct, no hemorrhage. Dense RIGHT middle cerebral artery consistent with thromboembolism. 2. ASPECTS is 7. 3. No acute cervical spine fracture or malalignment. Severe LEFT C3-4 through C5-6 neural foraminal narrowing. Critical Value/emergent results were called by telephone at the time of interpretation on 03-Feb-2016 at 4:00 am to Dr. Otelia Limes, NeurologyADELEKE Isabellah Sobocinski , who verbally acknowledged these results. Electronically Signed   By: Awilda Metro M.D.   On: 2016-02-03 04:10   Ct Cerebral Perfusion W Contrast  Result Date: 03-Feb-2016 CLINICAL DATA:  LEFT arm weakness,  facial droop. History of hypertension and atrial fibrillation. Follow-up acute RIGHT MCA territory infarct. EXAM: CT ANGIOGRAPHY HEAD AND NECK TECHNIQUE: Multiphase CT imaging of the brain was performed following IV bolus contrast injection. Subsequent parametric perfusion maps were calculated using RAPID software.Multidetector CT imaging of the head and neck was performed using the standard protocol during bolus administration of intravenous contrast. Multiplanar CT image reconstructions and MIPs were obtained to evaluate the vascular anatomy. Carotid stenosis measurements (when applicable) are obtained utilizing NASCET criteria, using the distal internal carotid diameter as the denominator. CONTRAST:  90 cc Isovue 370 COMPARISON:  CT HEAD and cervical spine Feb 03, 2016 at 0355 hours CT HEAD Feb 03, 2016 at 0352 hours FINDINGS: CT Brain Perfusion Findings: CBF (<30%) Volume: Perfusion (Tmax>6.0s) volume: Mismatch Volume: 72mL Infarction Location:1.5 CTA NECK AORTIC ARCH: Normal appearance of the thoracic arch, normal branch pattern. Mild calcific atherosclerosis. The origins of the innominate, left Common carotid artery and subclavian artery are widely patent. RIGHT CAROTID SYSTEM: Common carotid artery is widely patent, coursing in a straight line fashion. Minimal eccentric calcific atherosclerosis. Normal appearance of the carotid bifurcation without hemodynamically significant stenosis by NASCET criteria ; trace calcific atherosclerosis. Slight luminal irregularity of the distal RIGHT internal carotid artery attributed to atherosclerosis. LEFT CAROTID SYSTEM: Common carotid artery is widely patent, coursing in a straight line fashion. Normal appearance of the carotid bifurcation without hemodynamically significant stenosis by NASCET criteria, moderate calcific atherosclerosis. Normal appearance of the included internal carotid artery. VERTEBRAL ARTERIES:Left vertebral artery is dominant.  Normal appearance of the vertebral arteries, which appear widely patent. Mild extrinsic deformity  of the vertebral arteries due to degenerative cervical spine. SKELETON: No acute osseous process though bone windows have not been submitted. Please see CT of cervical spine from today, reported separately for dedicated findings. OTHER NECK: Soft tissues of the neck are non-acute though, not tailored for evaluation. CTA HEAD ANTERIOR CIRCULATION: Normal appearance of the cervical internal carotid arteries, petrous, cavernous and supra clinoid internal carotid arteries. Mild calcific atherosclerosis of the carotid siphons. Occluded proximal RIGHT M1 segment. Paucity of mid and distal vessels. Widely patent anterior communicating artery. Normal appearance of the anterior cerebral arteries. No large vessel occlusion, hemodynamically significant stenosis, dissection, luminal irregularity, contrast extravasation or aneurysm. POSTERIOR CIRCULATION: Normal appearance of the vertebral arteries, vertebrobasilar junction and basilar artery, as well as main branch vessels. Normal appearance of the posterior cerebral arteries. Moderate stenosis proximal RIGHT P3 segment. No large vessel occlusion, hemodynamically significant stenosis, dissection, luminal irregularity, contrast extravasation or aneurysm. VENOUS SINUSES: Major dural venous sinuses are patent though not tailored for evaluation on this angiographic examination. ANATOMIC VARIANTS: Supernumerary anterior cerebral artery arising from LEFT A1-2 junction. DELAYED PHASE: Not performed. IMPRESSION: CT perfusion: Acute large RIGHT MCA territory infarct, with additional 72 cc penumbra/brain at risk. CTA NECK: Atherosclerosis without hemodynamically significant stenosis or acute vascular process. CTA HEAD:  Emergent RIGHT M1 large vessel occlusion. Moderate stenosis RIGHT P3 segment. Acute findings discussed with and reconfirmed by Dr.Lindzen, Neurology on 01/17/2016 at 4:52 am.  Electronically Signed   By: Awilda Metroourtnay  Bloomer M.D.   On: Mar 04, 2016 04:53   Ct Head Code Stroke W/o Cm  Result Date: 02/01/2016 CLINICAL DATA:  Code stroke. LEFT arm weakness, facial droop. History of hypertension and atrial fibrillation. EXAM: CT HEAD WITHOUT CONTRAST CT CERVICAL SPINE WITHOUT CONTRAST TECHNIQUE: Multidetector CT imaging of the head and cervical spine was performed following the standard protocol without intravenous contrast. Multiplanar CT image reconstructions of the cervical spine were also generated. COMPARISON:  None. FINDINGS: CT HEAD FINDINGS BRAIN: Blurring of the RIGHT frontal lobe and posterior insula gray-white matter differentiation. The ventricles and sulci are normal for age. No intraparenchymal hemorrhage, mass effect nor midline shift. Patchy supratentorial white matter hypodensities within normal range for patient's age, though non-specific are most compatible with chronic small vessel ischemic disease. No abnormal extra-axial fluid collections. Basal cisterns are patent. VASCULAR: Dense RIGHT middle cerebral artery. Mild calcific atherosclerosis the carotid siphons. SKULL: No skull fracture. No significant scalp soft tissue swelling. SINUSES/ORBITS: Mild paranasal sinus mucosal thickening. Mastoid air cells are well aerated.The included ocular globes and orbital contents are non-suspicious. OTHER: None. ASPECTS [facility]([place] Stroke Program Early CT Score) - Ganglionic level infarction (caudate, lentiform nuclei, internal capsule, insula, M1-M3 cortex): 5 - Supraganglionic infarction (M4-M6 cortex): 2 Total score (0-10 with 10 being normal): 7 CT CERVICAL SPINE FINDINGS ALIGNMENT: Maintained lordosis. Minimal grade 1 C5-6 anterolisthesis. Mild broad dextroscoliosis could be positional. SKULL BASE AND VERTEBRAE: Cervical vertebral bodies and posterior elements are intact. Intervertebral disc heights preserved. Multilevel severe LEFT facet arthropathy. No destructive bony lesions. C1-2  articulation maintained. SOFT TISSUES AND SPINAL CANAL: Nonacute. Mild calcific atherosclerosis of the carotid bulbs. DISC LEVELS: No significant osseous canal stenosis. Severe LEFT C3-4 through C5-6 neural foraminal narrowing. UPPER CHEST: Fibronodular scarring RIGHT lung apex with calcified granuloma. OTHER: None. IMPRESSION: 1. Acute multifocal small to moderate MCA territory infarct, no hemorrhage. Dense RIGHT middle cerebral artery consistent with thromboembolism. 2. ASPECTS is 7. 3. No acute cervical spine fracture or malalignment. Severe LEFT C3-4 through C5-6 neural foraminal narrowing.  Critical Value/emergent results were called by telephone at the time of interpretation on 02/10/2016 at 4:00 am to Dr. Otelia Limes, NeurologyADELEKE Jeanann Balinski , who verbally acknowledged these results. Electronically Signed   By: Awilda Metro M.D.   On: 02/07/2016 04:10    Procedures Procedures   Medications Ordered in ED Medications  iopamidol (ISOVUE-370) 76 % injection (  Contrast Given 02/05/2016 0433)  iopamidol (ISOVUE-370) 76 % injection (90 mLs Intravenous Contrast Given 02/08/2016 0415)    Initial Impression / Assessment and Plan / ED Course  I have reviewed the triage vital signs and the nursing notes.  Pertinent labs & imaging results that were available during my care of the patient were reviewed by me and considered in my medical decision making (see chart for details).  Clinical Course    Patient presents emergency department for acute weakness of left side. CT scan reveals an acute right MCA stroke. He is on Xarelto, so he is not a TPA candidate. Interventional services cannot go after the clot. We'll continue Xarelto but hold aspirin for bleeding risk. He'll be admitted to stepdown for further care. Neurology was consult. I spoke with Dr. Toniann Fail limits patient for further care.   CRITICAL CARE Performed by: Tomasita Crumble   Total critical care time: 35 minutes - ischemic stroke, tpa  considered  Critical care time was exclusive of separately billable procedures and treating other patients.  Critical care was necessary to treat or prevent imminent or life-threatening deterioration.  Critical care was time spent personally by me on the following activities: development of treatment plan with patient and/or surrogate as well as nursing, discussions with consultants, evaluation of patient's response to treatment, examination of patient, obtaining history from patient or surrogate, ordering and performing treatments and interventions, ordering and review of laboratory studies, ordering and review of radiographic studies, pulse oximetry and re-evaluation of patient's condition.   Final Clinical Impressions(s) / ED Diagnoses   Final diagnoses:  CVA (cerebral vascular accident) (HCC)  Acute ischemic right MCA stroke [facility])   New Prescriptions New Prescriptions   No medications on file      I personally performed the services described in this documentation, which was scribed in my presence. The recorded information has been reviewed and is accurate.       Tomasita Crumble, MD 01/23/2016 [phone]

## 2016-01-14 ENCOUNTER — Inpatient Hospital Stay (HOSPITAL_COMMUNITY): Payer: Medicare Other

## 2016-01-14 ENCOUNTER — Encounter (HOSPITAL_COMMUNITY): Payer: Self-pay | Admitting: Radiology

## 2016-01-14 DIAGNOSIS — I482 Chronic atrial fibrillation: Secondary | ICD-10-CM

## 2016-01-14 DIAGNOSIS — E785 Hyperlipidemia, unspecified: Secondary | ICD-10-CM

## 2016-01-14 DIAGNOSIS — R400 Somnolence: Secondary | ICD-10-CM

## 2016-01-14 DIAGNOSIS — I63511 Cerebral infarction due to unspecified occlusion or stenosis of right middle cerebral artery: Secondary | ICD-10-CM

## 2016-01-14 DIAGNOSIS — I6789 Other cerebrovascular disease: Secondary | ICD-10-CM

## 2016-01-14 DIAGNOSIS — R4701 Aphasia: Secondary | ICD-10-CM

## 2016-01-14 DIAGNOSIS — R0682 Tachypnea, not elsewhere classified: Secondary | ICD-10-CM

## 2016-01-14 DIAGNOSIS — I749 Embolism and thrombosis of unspecified artery: Secondary | ICD-10-CM

## 2016-01-14 DIAGNOSIS — E039 Hypothyroidism, unspecified: Secondary | ICD-10-CM

## 2016-01-14 DIAGNOSIS — Z4659 Encounter for fitting and adjustment of other gastrointestinal appliance and device: Secondary | ICD-10-CM

## 2016-01-14 DIAGNOSIS — I10 Essential (primary) hypertension: Secondary | ICD-10-CM

## 2016-01-14 DIAGNOSIS — R0603 Acute respiratory distress: Secondary | ICD-10-CM

## 2016-01-14 LAB — BLOOD GAS, ARTERIAL
Acid-base deficit: 0.4 mmol/L (ref 0.0–2.0)
Bicarbonate: 22.7 mmol/L (ref 20.0–28.0)
DRAWN BY: 246861
O2 Content: 2 L/min
O2 Saturation: 92.8 %
PATIENT TEMPERATURE: 98.6
PH ART: 7.483 — AB (ref 7.350–7.450)
pCO2 arterial: 30.6 mmHg — ABNORMAL LOW (ref 32.0–48.0)
pO2, Arterial: 64.3 mmHg — ABNORMAL LOW (ref 83.0–108.0)

## 2016-01-14 LAB — COMPREHENSIVE METABOLIC PANEL
ALT: 28 U/L (ref 17–63)
AST: 34 U/L (ref 15–41)
Albumin: 3.6 g/dL (ref 3.5–5.0)
Alkaline Phosphatase: 58 U/L (ref 38–126)
Anion gap: 10 (ref 5–15)
BUN: 13 mg/dL (ref 6–20)
CHLORIDE: 106 mmol/L (ref 101–111)
CO2: 22 mmol/L (ref 22–32)
CREATININE: 0.98 mg/dL (ref 0.61–1.24)
Calcium: 9 mg/dL (ref 8.9–10.3)
GFR calc Af Amer: >60 mL/min (ref >60)
GLUCOSE: 172 mg/dL — AB (ref 65–99)
POTASSIUM: 4.1 mmol/L (ref 3.5–5.1)
Sodium: 138 mmol/L (ref 135–145)
Total Bilirubin: 3 mg/dL — ABNORMAL HIGH (ref 0.3–1.2)
Total Protein: 6.6 g/dL (ref 6.5–8.1)

## 2016-01-14 LAB — GLUCOSE, CAPILLARY
GLUCOSE-CAPILLARY: 172 mg/dL — AB (ref 65–99)
Glucose-Capillary: 138 mg/dL — ABNORMAL HIGH (ref 65–99)
Glucose-Capillary: 161 mg/dL — ABNORMAL HIGH (ref 65–99)

## 2016-01-14 LAB — HEMOGLOBIN A1C
HEMOGLOBIN A1C: 6.6 % — AB (ref 4.8–5.6)
Mean Plasma Glucose: 143 mg/dL

## 2016-01-14 LAB — ECHOCARDIOGRAM COMPLETE
HEIGHTINCHES: 73 in
Weight: 3068.8 oz

## 2016-01-14 LAB — MAGNESIUM: Magnesium: 1.9 mg/dL (ref 1.7–2.4)

## 2016-01-14 MED ORDER — CHLORHEXIDINE GLUCONATE 0.12 % MT SOLN
15.0000 mL | Freq: Two times a day (BID) | OROMUCOSAL | Status: DC
  Administered 2016-01-14 – 2016-01-27 (×25): 15 mL via OROMUCOSAL
  Filled 2016-01-14 (×15): qty 15

## 2016-01-14 MED ORDER — ALLOPURINOL 300 MG PO TABS
300.0000 mg | ORAL_TABLET | Freq: Every day | ORAL | Status: DC
  Administered 2016-01-14 – 2016-01-25 (×8): 300 mg via ORAL
  Filled 2016-01-14 (×10): qty 1

## 2016-01-14 MED ORDER — IOPAMIDOL (ISOVUE-370) INJECTION 76%
INTRAVENOUS | Status: AC
  Administered 2016-01-14: 100 mL
  Filled 2016-01-14: qty 100

## 2016-01-14 MED ORDER — METOPROLOL TARTRATE 5 MG/5ML IV SOLN
INTRAVENOUS | Status: AC
  Administered 2016-01-14: 5 mg via INTRAVENOUS
  Filled 2016-01-14: qty 5

## 2016-01-14 MED ORDER — METOPROLOL TARTRATE 25 MG PO TABS
25.0000 mg | ORAL_TABLET | Freq: Two times a day (BID) | ORAL | Status: DC
  Administered 2016-01-14 – 2016-01-17 (×7): 25 mg via ORAL
  Filled 2016-01-14 (×7): qty 1

## 2016-01-14 MED ORDER — LEVOTHYROXINE SODIUM 100 MCG PO TABS
100.0000 ug | ORAL_TABLET | Freq: Every day | ORAL | Status: DC
  Administered 2016-01-15 – 2016-01-25 (×7): 100 ug via ORAL
  Filled 2016-01-14 (×10): qty 1

## 2016-01-14 MED ORDER — LISINOPRIL-HYDROCHLOROTHIAZIDE 20-12.5 MG PO TABS: 1.0000 | ORAL_TABLET | Freq: Every day | ORAL | Status: DC

## 2016-01-14 MED ORDER — METOPROLOL TARTRATE 5 MG/5ML IV SOLN
5.0000 mg | Freq: Once | INTRAVENOUS | Status: AC
  Administered 2016-01-14: 5 mg via INTRAVENOUS

## 2016-01-14 MED ORDER — DIGOXIN 125 MCG PO TABS
0.1250 mg | ORAL_TABLET | Freq: Every day | ORAL | Status: DC
  Administered 2016-01-14 – 2016-01-25 (×8): 0.125 mg via ORAL
  Filled 2016-01-14 (×10): qty 1

## 2016-01-14 MED ORDER — ATORVASTATIN CALCIUM 40 MG PO TABS
40.0000 mg | ORAL_TABLET | Freq: Every day | ORAL | Status: DC
  Administered 2016-01-14 – 2016-01-25 (×8): 40 mg via ORAL
  Filled 2016-01-14 (×8): qty 1

## 2016-01-14 MED ORDER — SODIUM CHLORIDE 0.9 % IV BOLUS (SEPSIS)
250.0000 mL | Freq: Once | INTRAVENOUS | Status: AC
  Administered 2016-01-14: 250 mL via INTRAVENOUS

## 2016-01-14 MED ORDER — METOPROLOL TARTRATE 5 MG/5ML IV SOLN
5.0000 mg | Freq: Once | INTRAVENOUS | Status: AC
  Administered 2016-01-14: 5 mg via INTRAVENOUS
  Filled 2016-01-14: qty 5

## 2016-01-14 MED ORDER — ORAL CARE MOUTH RINSE
15.0000 mL | Freq: Two times a day (BID) | OROMUCOSAL | Status: DC
  Administered 2016-01-14 – 2016-01-27 (×23): 15 mL via OROMUCOSAL

## 2016-01-14 MED ORDER — ALFUZOSIN HCL ER 10 MG PO TB24
10.0000 mg | ORAL_TABLET | Freq: Every day | ORAL | Status: DC
  Administered 2016-01-14 – 2016-01-22 (×4): 10 mg via ORAL
  Filled 2016-01-14 (×10): qty 1

## 2016-01-14 NOTE — Progress Notes (Signed)
PROGRESS NOTE    Jerry [name]  ZOX:[id number] DOB: Aug 23, 1931 DOA: 02/08/2016 PCP: Ezequiel Kayser, MD   Brief Narrative:  83 y.o. male who presents with acute onset of left facial droop with LUE and LLE weakness. Pt with R MCA stroke and Neurology on board.   Assessment & Plan:   Principal Problem:   Acute ischemic right MCA stroke (HCC) - Undergoing stroke w/u by neurology  Active Problems:   ATRIAL FIBRILLATION - Pt on aspirin - rate controlled of rate controlling agent    Essential hypertension - allow for permissive hypertension  Tachypnea - last chest x ray negative - will obtain ct angio of chest to assess for PE    Hypothyroidism - stable continue synthroid    CVA (cerebral vascular accident) [facility]) - Neurology managing. Pt undergoing work up    Hyperlipidemia - stable   DVT prophylaxis: Lovenox Code Status: Full Family Communication: none at bedside Disposition Plan: pending improvement in condition   Consultants:   Neurology   Procedures: None   Antimicrobials: None   Subjective: Pt resting in bed. No new complaints reported  Objective: Vitals:   01/14/16 0900 01/14/16 1000 01/14/16 1100 01/14/16 1400  BP: (!) 143/77 (!) 145/76 (!) 162/79 (!) 155/65  Pulse: 75 77 91 80  Resp: (!) 29 (!) 29 (!) 28 (!) 30  Temp:   97.6 F (36.4 C)   TempSrc:   Axillary   SpO2: 97% 97% 96% 96%  Weight:      Height:        Intake/Output Summary (Last 24 hours) at 01/14/16 1534 Last data filed at 01/14/16 1100  Gross per 24 hour  Intake             1200 ml  Output              725 ml  Net              475 ml   Filed Weights   02/05/2016 0429 01/16/2016 0656  Weight: 87.2 kg (192 lb 5 oz) 87 kg (191 lb 12.8 oz)    Examination:  General exam: calm and comfortable, in nad. Respiratory system: elevated RR no accessory muscle use, no wheezes, equal chest rise. Cardiovascular system: S1 & S2 heard, IRR. No JVD, rubs Gastrointestinal system: Abdomen  is nondistended, soft and nontender. No organomegaly or masses felt. Normal bowel sounds heard. Central nervous system: L sided weakness, facial asymmetry Extremities: no cyanosis, no clubbing Skin: No rashes, lesions or ulcers Psychiatry: unable to assess due to limited pt cooperation    Data Reviewed: I have personally reviewed following labs and imaging studies  CBC:  Recent Labs Lab 01/18/2016 0350 02/07/2016 0356  WBC 8.8  --   NEUTROABS 5.1  --   HGB 14.4 14.3  HCT 42.4 42.0  MCV 97.7  --   PLT 216  --    Basic Metabolic Panel:  Recent Labs Lab 01/19/2016 0350 01/14/2016 0356 01/14/16 0850  NA 138 140 138  K 3.9 3.8 4.1  CL 103 104 106  CO2 26  --  22  GLUCOSE 155* 151* 172*  BUN 9 12 13   CREATININE 0.92 0.90 0.98  CALCIUM 9.5  --  9.0  MG  --   --  1.9   GFR: Estimated Creatinine Clearance: 65.7 mL/min (by C-G formula based on SCr of 0.98 mg/dL). Liver Function Tests:  Recent Labs Lab 01/19/2016 0350 01/14/16 0850  AST 34 34  ALT 34  28  ALKPHOS 77 58  BILITOT 1.5* 3.0*  PROT 7.2 6.6  ALBUMIN 4.1 3.6   No results for input(s): LIPASE, AMYLASE in the last 168 hours. No results for input(s): AMMONIA in the last 168 hours. Coagulation Profile:  Recent Labs Lab 01/22/2016 0350  INR 1.89   Cardiac Enzymes:  Recent Labs Lab 02/06/2016 0730  TROPONINI <0.03   BNP (last 3 results) No results for input(s): PROBNP in the last 8760 hours. HbA1C:  Recent Labs  02/06/2016 0730  HGBA1C 6.6*   CBG:  Recent Labs Lab 02/02/2016 0348 01/26/2016 1435 02/08/2016 2318 01/14/16 0404 01/14/16 0735  GLUCAP 143* 144* 138* 172* 161*   Lipid Profile:  Recent Labs  01/27/2016 0730  CHOL 139  HDL 51  LDLCALC 69  TRIG 94  CHOLHDL 2.7   Thyroid Function Tests:  Recent Labs  01/25/2016 0730  TSH 1.323   Anemia Panel: No results for input(s): VITAMINB12, FOLATE, FERRITIN, TIBC, IRON, RETICCTPCT in the last 72 hours. Sepsis Labs: No results for input(s):  PROCALCITON, LATICACIDVEN in the last 168 hours.  Recent Results (from the past 240 hour(s))  MRSA PCR Screening     Status: None   Collection Time: 01/12/2016  7:09 AM  Result Value Ref Range Status   MRSA by PCR NEGATIVE NEGATIVE Final    Comment:        The GeneXpert MRSA Assay (FDA approved for NASAL specimens only), is one component of a comprehensive MRSA colonization surveillance program. It is not intended to diagnose MRSA infection nor to guide or monitor treatment for MRSA infections.          Radiology Studies: Ct Angio Head W Or Wo Contrast  Result Date: 01/18/2016 CLINICAL DATA:  LEFT arm weakness, facial droop. History of hypertension and atrial fibrillation. Follow-up acute RIGHT MCA territory infarct. EXAM: CT ANGIOGRAPHY HEAD AND NECK TECHNIQUE: Multiphase CT imaging of the brain was performed following IV bolus contrast injection. Subsequent parametric perfusion maps were calculated using RAPID software.Multidetector CT imaging of the head and neck was performed using the standard protocol during bolus administration of intravenous contrast. Multiplanar CT image reconstructions and MIPs were obtained to evaluate the vascular anatomy. Carotid stenosis measurements (when applicable) are obtained utilizing NASCET criteria, using the distal internal carotid diameter as the denominator. CONTRAST:  90 cc Isovue 370 COMPARISON:  CT HEAD and cervical spine January 13, 2016 at 0355 hours CT HEAD January 13, 2016 at 0352 hours FINDINGS: CT Brain Perfusion Findings: CBF (<30%) Volume: Perfusion (Tmax>6.0s) volume: Mismatch Volume: 72mL Infarction Location:1.5 CTA NECK AORTIC ARCH: Normal appearance of the thoracic arch, normal branch pattern. Mild calcific atherosclerosis. The origins of the innominate, left Common carotid artery and subclavian artery are widely patent. RIGHT CAROTID SYSTEM: Common carotid artery is widely patent, coursing in a straight line fashion. Minimal  eccentric calcific atherosclerosis. Normal appearance of the carotid bifurcation without hemodynamically significant stenosis by NASCET criteria ; trace calcific atherosclerosis. Slight luminal irregularity of the distal RIGHT internal carotid artery attributed to atherosclerosis. LEFT CAROTID SYSTEM: Common carotid artery is widely patent, coursing in a straight line fashion. Normal appearance of the carotid bifurcation without hemodynamically significant stenosis by NASCET criteria, moderate calcific atherosclerosis. Normal appearance of the included internal carotid artery. VERTEBRAL ARTERIES:Left vertebral artery is dominant. Normal appearance of the vertebral arteries, which appear widely patent. Mild extrinsic deformity of the vertebral arteries due to degenerative cervical spine. SKELETON: No acute osseous process though bone windows have not been  submitted. Please see CT of cervical spine from today, reported separately for dedicated findings. OTHER NECK: Soft tissues of the neck are non-acute though, not tailored for evaluation. CTA HEAD ANTERIOR CIRCULATION: Normal appearance of the cervical internal carotid arteries, petrous, cavernous and supra clinoid internal carotid arteries. Mild calcific atherosclerosis of the carotid siphons. Occluded proximal RIGHT M1 segment. Paucity of mid and distal vessels. Widely patent anterior communicating artery. Normal appearance of the anterior cerebral arteries. No large vessel occlusion, hemodynamically significant stenosis, dissection, luminal irregularity, contrast extravasation or aneurysm. POSTERIOR CIRCULATION: Normal appearance of the vertebral arteries, vertebrobasilar junction and basilar artery, as well as main branch vessels. Normal appearance of the posterior cerebral arteries. Moderate stenosis proximal RIGHT P3 segment. No large vessel occlusion, hemodynamically significant stenosis, dissection, luminal irregularity, contrast extravasation or aneurysm.  VENOUS SINUSES: Major dural venous sinuses are patent though not tailored for evaluation on this angiographic examination. ANATOMIC VARIANTS: Supernumerary anterior cerebral artery arising from LEFT A1-2 junction. DELAYED PHASE: Not performed. IMPRESSION: CT perfusion: Acute large RIGHT MCA territory infarct, with additional 72 cc penumbra/brain at risk. CTA NECK: Atherosclerosis without hemodynamically significant stenosis or acute vascular process. CTA HEAD:  Emergent RIGHT M1 large vessel occlusion. Moderate stenosis RIGHT P3 segment. Acute findings discussed with and reconfirmed by Dr.Lindzen, Neurology on 2016-01-17 at 4:52 am. Electronically Signed   By: Awilda Metro M.D.   On: January 17, 2016 04:53   Ct Head Wo Contrast  Result Date: 01/14/2016 CLINICAL DATA:  Embolic infarction EXAM: CT HEAD WITHOUT CONTRAST TECHNIQUE: Contiguous axial images were obtained from the base of the skull through the vertex without intravenous contrast. COMPARISON:  MRI 01/17/16 FINDINGS: Brain: Developing hypodensity in the right MCA territory compatible with acute infarct. Similar territory to diffusion-weighted imaging yesterday. This involves the temporal lobe, insula, and portions of the right frontal parietal lobe. No associated hemorrhage. No shift of the midline structures. Ventricle size normal.  Negative for intracranial hemorrhage. Vascular: Hyperdense right MCA compatible with thrombus, less prominent on today's study compared with yesterday. Skull: Negative Sinuses/Orbits: Mild mucosal edema in the paranasal sinuses Other: None IMPRESSION: Developing hypodensity in the right MCA territory compatible with acute infarct. Similar territory to MRI yesterday. Negative for hemorrhage. Electronically Signed   By: Marlan Palau M.D.   On: 01/14/2016 10:16   Ct Angio Neck W And/or Wo Contrast  Result Date: Jan 17, 2016 CLINICAL DATA:  LEFT arm weakness, facial droop. History of hypertension and atrial fibrillation.  Follow-up acute RIGHT MCA territory infarct. EXAM: CT ANGIOGRAPHY HEAD AND NECK TECHNIQUE: Multiphase CT imaging of the brain was performed following IV bolus contrast injection. Subsequent parametric perfusion maps were calculated using RAPID software.Multidetector CT imaging of the head and neck was performed using the standard protocol during bolus administration of intravenous contrast. Multiplanar CT image reconstructions and MIPs were obtained to evaluate the vascular anatomy. Carotid stenosis measurements (when applicable) are obtained utilizing NASCET criteria, using the distal internal carotid diameter as the denominator. CONTRAST:  90 cc Isovue 370 COMPARISON:  CT HEAD and cervical spine 01/17/2016 at 0355 hours CT HEAD 2016/01/17 at 0352 hours FINDINGS: CT Brain Perfusion Findings: CBF (<30%) Volume: Perfusion (Tmax>6.0s) volume: Mismatch Volume: 72mL Infarction Location:1.5 CTA NECK AORTIC ARCH: Normal appearance of the thoracic arch, normal branch pattern. Mild calcific atherosclerosis. The origins of the innominate, left Common carotid artery and subclavian artery are widely patent. RIGHT CAROTID SYSTEM: Common carotid artery is widely patent, coursing in a straight line fashion. Minimal eccentric  calcific atherosclerosis. Normal appearance of the carotid bifurcation without hemodynamically significant stenosis by NASCET criteria ; trace calcific atherosclerosis. Slight luminal irregularity of the distal RIGHT internal carotid artery attributed to atherosclerosis. LEFT CAROTID SYSTEM: Common carotid artery is widely patent, coursing in a straight line fashion. Normal appearance of the carotid bifurcation without hemodynamically significant stenosis by NASCET criteria, moderate calcific atherosclerosis. Normal appearance of the included internal carotid artery. VERTEBRAL ARTERIES:Left vertebral artery is dominant. Normal appearance of the vertebral arteries, which appear widely  patent. Mild extrinsic deformity of the vertebral arteries due to degenerative cervical spine. SKELETON: No acute osseous process though bone windows have not been submitted. Please see CT of cervical spine from today, reported separately for dedicated findings. OTHER NECK: Soft tissues of the neck are non-acute though, not tailored for evaluation. CTA HEAD ANTERIOR CIRCULATION: Normal appearance of the cervical internal carotid arteries, petrous, cavernous and supra clinoid internal carotid arteries. Mild calcific atherosclerosis of the carotid siphons. Occluded proximal RIGHT M1 segment. Paucity of mid and distal vessels. Widely patent anterior communicating artery. Normal appearance of the anterior cerebral arteries. No large vessel occlusion, hemodynamically significant stenosis, dissection, luminal irregularity, contrast extravasation or aneurysm. POSTERIOR CIRCULATION: Normal appearance of the vertebral arteries, vertebrobasilar junction and basilar artery, as well as main branch vessels. Normal appearance of the posterior cerebral arteries. Moderate stenosis proximal RIGHT P3 segment. No large vessel occlusion, hemodynamically significant stenosis, dissection, luminal irregularity, contrast extravasation or aneurysm. VENOUS SINUSES: Major dural venous sinuses are patent though not tailored for evaluation on this angiographic examination. ANATOMIC VARIANTS: Supernumerary anterior cerebral artery arising from LEFT A1-2 junction. DELAYED PHASE: Not performed. IMPRESSION: CT perfusion: Acute large RIGHT MCA territory infarct, with additional 72 cc penumbra/brain at risk. CTA NECK: Atherosclerosis without hemodynamically significant stenosis or acute vascular process. CTA HEAD:  Emergent RIGHT M1 large vessel occlusion. Moderate stenosis RIGHT P3 segment. Acute findings discussed with and reconfirmed by Dr.Lindzen, Neurology on 2016/02/03 at 4:52 am. Electronically Signed   By: Awilda Metro M.D.   On:  2016-02-03 04:53   Ct Cervical Spine Wo Contrast  Result Date: 02/03/2016 CLINICAL DATA:  Code stroke. LEFT arm weakness, facial droop. History of hypertension and atrial fibrillation. EXAM: CT HEAD WITHOUT CONTRAST CT CERVICAL SPINE WITHOUT CONTRAST TECHNIQUE: Multidetector CT imaging of the head and cervical spine was performed following the standard protocol without intravenous contrast. Multiplanar CT image reconstructions of the cervical spine were also generated. COMPARISON:  None. FINDINGS: CT HEAD FINDINGS BRAIN: Blurring of the RIGHT frontal lobe and posterior insula gray-white matter differentiation. The ventricles and sulci are normal for age. No intraparenchymal hemorrhage, mass effect nor midline shift. Patchy supratentorial white matter hypodensities within normal range for patient's age, though non-specific are most compatible with chronic small vessel ischemic disease. No abnormal extra-axial fluid collections. Basal cisterns are patent. VASCULAR: Dense RIGHT middle cerebral artery. Mild calcific atherosclerosis the carotid siphons. SKULL: No skull fracture. No significant scalp soft tissue swelling. SINUSES/ORBITS: Mild paranasal sinus mucosal thickening. Mastoid air cells are well aerated.The included ocular globes and orbital contents are non-suspicious. OTHER: None. ASPECTS [facility] Stroke Program Early CT Score) - Ganglionic level infarction (caudate, lentiform nuclei, internal capsule, insula, M1-M3 cortex): 5 - Supraganglionic infarction (M4-M6 cortex): 2 Total score (0-10 with 10 being normal): 7 CT CERVICAL SPINE FINDINGS ALIGNMENT: Maintained lordosis. Minimal grade 1 C5-6 anterolisthesis. Mild broad dextroscoliosis could be positional. SKULL BASE AND VERTEBRAE: Cervical vertebral bodies and posterior elements are intact. Intervertebral disc heights preserved. Multilevel severe LEFT  facet arthropathy. No destructive bony lesions. C1-2 articulation maintained. SOFT TISSUES AND SPINAL  CANAL: Nonacute. Mild calcific atherosclerosis of the carotid bulbs. DISC LEVELS: No significant osseous canal stenosis. Severe LEFT C3-4 through C5-6 neural foraminal narrowing. UPPER CHEST: Fibronodular scarring RIGHT lung apex with calcified granuloma. OTHER: None. IMPRESSION: 1. Acute multifocal small to moderate MCA territory infarct, no hemorrhage. Dense RIGHT middle cerebral artery consistent with thromboembolism. 2. ASPECTS is 7. 3. No acute cervical spine fracture or malalignment. Severe LEFT C3-4 through C5-6 neural foraminal narrowing. Critical Value/emergent results were called by telephone at the time of interpretation on 01-23-2016 at 4:00 am to Dr. Otelia Limes, NeurologyADELEKE ONI , who verbally acknowledged these results. Electronically Signed   By: Awilda Metro M.D.   On: 2016-01-23 04:10   Mr Brain Wo Contrast  Result Date: 23-Jan-2016 CLINICAL DATA:  Initial evaluation for acute stroke. EXAM: MRI HEAD WITHOUT CONTRAST TECHNIQUE: Multiplanar, multiecho pulse sequences of the brain and surrounding structures were obtained without intravenous contrast. COMPARISON:  Prior studies from earlier the same day. FINDINGS: Brain: Study moderately degraded by motion artifact. Extensive confluent restricted diffusion involving the majority of the right MCA territory, corresponding with previously seen perfusion defect on prior study, compatible with acute ischemic stroke. Involvement of the right basal ganglia noted. Scattered areas of petechial hemorrhage within the area of infarction without frank hemorrhagic transformation. Minimal attenuation of the right lateral ventricle without significant mass effect. No midline shift. No other evidence for acute infarction. Underlying cerebral atrophy noted. Periventricular T2/FLAIR hyperintensity compatible with chronic microvascular ischemic disease. Chronic microvascular ischemic changes also noted within the pons. No mass lesion, midline shift, or mass effect.  No hydrocephalus. No extra-axial fluid collection. Major dural sinuses are grossly patent. Pituitary gland within normal limits. Vascular: Right M1 segment appears occluded at its mid- distal aspect. This is better evaluated on prior CTA. Major intracranial vascular flow voids otherwise maintained. Skull and upper cervical spine: Craniocervical junction within normal limits. Upper cervical spine unremarkable. Bone marrow signal intensity within normal limits. No scalp soft tissue abnormality. Sinuses/Orbits: Globes and orbital soft tissues within normal limits. No made of the patient's the aids which is directed to the right. Scattered mucosal thickening throughout the paranasal sinuses. No air-fluid level to suggest active IMPRESSION: 1. Large evolving acute right MCA territory infarct as above. Associated petechial hemorrhage without frank hemorrhagic transformation. No significant mass effect at this time. 2. Underlying age-related cerebral atrophy with mild chronic microvascular ischemic disease. Electronically Signed   By: Rise Mu M.D.   On: 23-Jan-2016 20:48   Ct Cerebral Perfusion W Contrast  Result Date: 23-Jan-2016 CLINICAL DATA:  LEFT arm weakness, facial droop. History of hypertension and atrial fibrillation. Follow-up acute RIGHT MCA territory infarct. EXAM: CT ANGIOGRAPHY HEAD AND NECK TECHNIQUE: Multiphase CT imaging of the brain was performed following IV bolus contrast injection. Subsequent parametric perfusion maps were calculated using RAPID software.Multidetector CT imaging of the head and neck was performed using the standard protocol during bolus administration of intravenous contrast. Multiplanar CT image reconstructions and MIPs were obtained to evaluate the vascular anatomy. Carotid stenosis measurements (when applicable) are obtained utilizing NASCET criteria, using the distal internal carotid diameter as the denominator. CONTRAST:  90 cc Isovue 370 COMPARISON:  CT HEAD and  cervical spine Jan 23, 2016 at 0355 hours CT HEAD 2016/01/23 at 0352 hours FINDINGS: CT Brain Perfusion Findings: CBF (<30%) Volume: Perfusion (Tmax>6.0s) volume: Mismatch Volume: 72mL Infarction Location:1.5 CTA NECK AORTIC ARCH:  Normal appearance of the thoracic arch, normal branch pattern. Mild calcific atherosclerosis. The origins of the innominate, left Common carotid artery and subclavian artery are widely patent. RIGHT CAROTID SYSTEM: Common carotid artery is widely patent, coursing in a straight line fashion. Minimal eccentric calcific atherosclerosis. Normal appearance of the carotid bifurcation without hemodynamically significant stenosis by NASCET criteria ; trace calcific atherosclerosis. Slight luminal irregularity of the distal RIGHT internal carotid artery attributed to atherosclerosis. LEFT CAROTID SYSTEM: Common carotid artery is widely patent, coursing in a straight line fashion. Normal appearance of the carotid bifurcation without hemodynamically significant stenosis by NASCET criteria, moderate calcific atherosclerosis. Normal appearance of the included internal carotid artery. VERTEBRAL ARTERIES:Left vertebral artery is dominant. Normal appearance of the vertebral arteries, which appear widely patent. Mild extrinsic deformity of the vertebral arteries due to degenerative cervical spine. SKELETON: No acute osseous process though bone windows have not been submitted. Please see CT of cervical spine from today, reported separately for dedicated findings. OTHER NECK: Soft tissues of the neck are non-acute though, not tailored for evaluation. CTA HEAD ANTERIOR CIRCULATION: Normal appearance of the cervical internal carotid arteries, petrous, cavernous and supra clinoid internal carotid arteries. Mild calcific atherosclerosis of the carotid siphons. Occluded proximal RIGHT M1 segment. Paucity of mid and distal vessels. Widely patent anterior communicating artery. Normal appearance  of the anterior cerebral arteries. No large vessel occlusion, hemodynamically significant stenosis, dissection, luminal irregularity, contrast extravasation or aneurysm. POSTERIOR CIRCULATION: Normal appearance of the vertebral arteries, vertebrobasilar junction and basilar artery, as well as main branch vessels. Normal appearance of the posterior cerebral arteries. Moderate stenosis proximal RIGHT P3 segment. No large vessel occlusion, hemodynamically significant stenosis, dissection, luminal irregularity, contrast extravasation or aneurysm. VENOUS SINUSES: Major dural venous sinuses are patent though not tailored for evaluation on this angiographic examination. ANATOMIC VARIANTS: Supernumerary anterior cerebral artery arising from LEFT A1-2 junction. DELAYED PHASE: Not performed. IMPRESSION: CT perfusion: Acute large RIGHT MCA territory infarct, with additional 72 cc penumbra/brain at risk. CTA NECK: Atherosclerosis without hemodynamically significant stenosis or acute vascular process. CTA HEAD:  Emergent RIGHT M1 large vessel occlusion. Moderate stenosis RIGHT P3 segment. Acute findings discussed with and reconfirmed by Dr.Lindzen, Neurology on 01/16/2016 at 4:52 am. Electronically Signed   By: Awilda Metroourtnay  Bloomer M.D.   On: 01/30/2016 04:53   Dg Chest Port 1 View  Result Date: 01/14/2016 CLINICAL DATA:  Respiratory distress EXAM: PORTABLE CHEST 1 VIEW COMPARISON:  None. FINDINGS: There is no edema or consolidation. Heart is mildly enlarged with pulmonary vascularity within normal limits. No adenopathy. There is atherosclerotic calcification in the aorta. No evident bone lesions. IMPRESSION: Mild cardiac enlargement. No edema or consolidation. There is aortic atherosclerosis. Electronically Signed   By: Bretta BangWilliam  Woodruff III M.D.   On: 01/14/2016 14:15   Dg Swallowing Func-speech Pathology  Result Date: 02/02/2016 Objective Swallowing Evaluation: Type of Study: MBS-Modified Barium Swallow Study Patient Details  Name: Lorna FewHarold D Lowder MRN: [id number] Date of Birth: 21-Jan-1931 Today's Date: 02/04/2016 Time: SLP Start Time (ACUTE ONLY): 1355-SLP Stop Time (ACUTE ONLY): 1416 SLP Time Calculation (min) (ACUTE ONLY): 21 min Past Medical History: Past Medical History: Diagnosis Date . Dyslipidemia  . Gout  . HTN (hypertension)  . Hypothyroidism  . Paroxysmal atrial fibrillation [facility](HCC)  Past Surgical History: Past Surgical History: Procedure Laterality Date . TONSILLECTOMY AND ADENOIDECTOMY  AGE 52 HPI: Pt presents with R Ischemic MCA Infarct. Pt with hx of A-fib, HTN, and Gout.  Subjective: pt drowsy but alert enough to try POs,  responsive to questions Assessment / Plan / Recommendation CHL IP CLINICAL IMPRESSIONS Feb 06, 2016 Therapy Diagnosis Moderate oral phase dysphagia;Moderate pharyngeal phase dysphagia  Clinical Impression Pt has a moderate oropharyngeal dysphagia due to sensorimotor and cognitive deficits. Weak lingual manipulation results in decreased bolus cohesion and delayed transit. Thickened liquids spill to the pyriform sinuses, with pt often trying to talk while liquids are continuing to spill into his pharynx. Penetration occurs consistently even with small spoonfuls of thickened liquids, and elicited a throat clear only one time. Multiple cues were needed for effortful cough to clear penetration each time if occurred. Penetration appeared to occur more deeply as residue built up more in his valleculae. No airway compromise occurred with trials of puree, although there is moderate vallecular residue that remains post-swallow. Cues for a second swallow can reduce but not eliminate this residue, and pt cognitively is not able to follow commands to try additional compensatory strategies. Recommend that pt remain NPO except for meds crushed in puree with use of multiple swallows per bolus. Pt may also have a small, pureed snack when fully alert to facilitate pharyngeal strengthening.  Impact on safety and function Moderate  aspiration risk   [facility] IP TREATMENT RECOMMENDATION Feb 06, 2016 Treatment Recommendations Therapy as outlined in treatment plan below   Prognosis 02/06/2016 Prognosis for Safe Diet Advancement Good Barriers to Reach Goals Cognitive deficits Barriers/Prognosis Comment -- [facility] IP DIET RECOMMENDATION 06-Feb-2016 SLP Diet Recommendations NPO except meds;Other (Comment) Liquid Administration via -- Medication Administration Crushed with puree Compensations Slow rate;Small sips/bites;Multiple dry swallows after each bite/sip Postural Changes Remain semi-upright after after feeds/meals (Comment);Seated upright at 90 degrees   [facility] IP OTHER RECOMMENDATIONS 2016-02-06 Recommended Consults -- Oral Care Recommendations Oral care QID Other Recommendations Have oral suction available;Remove water pitcher;Prohibited food (jello, ice cream, thin soups)   [facility] IP FOLLOW UP RECOMMENDATIONS 2016-02-06 Follow up Recommendations Inpatient Rehab   [facility] IP FREQUENCY AND DURATION 02-06-16 Speech Therapy Frequency (ACUTE ONLY) min 2x/week Treatment Duration 2 weeks      [facility] IP ORAL PHASE 2016/02/06 Oral Phase Impaired Oral - Pudding Teaspoon -- Oral - Pudding Cup -- Oral - Honey Teaspoon Weak lingual manipulation;Delayed oral transit;Decreased bolus cohesion Oral - Honey Cup -- Oral - Nectar Teaspoon Weak lingual manipulation;Delayed oral transit;Decreased bolus cohesion Oral - Nectar Cup -- Oral - Nectar Straw -- Oral - Thin Teaspoon -- Oral - Thin Cup -- Oral - Thin Straw -- Oral - Puree Weak lingual manipulation;Delayed oral transit;Decreased bolus cohesion Oral - Mech Soft -- Oral - Regular -- Oral - Multi-Consistency -- Oral - Pill -- Oral Phase - Comment --  [facility] IP PHARYNGEAL PHASE February 06, 2016 Pharyngeal Phase Impaired Pharyngeal- Pudding Teaspoon -- Pharyngeal -- Pharyngeal- Pudding Cup -- Pharyngeal -- Pharyngeal- Honey Teaspoon Delayed swallow initiation-pyriform sinuses;Reduced tongue base retraction;Pharyngeal residue -  valleculae;Penetration/Aspiration before swallow Pharyngeal Material enters airway, remains ABOVE vocal cords and not ejected out Pharyngeal- Honey Cup -- Pharyngeal -- Pharyngeal- Nectar Teaspoon Delayed swallow initiation-pyriform sinuses;Reduced tongue base retraction;Pharyngeal residue - valleculae;Penetration/Aspiration before swallow Pharyngeal Material enters airway, remains ABOVE vocal cords and not ejected out Pharyngeal- Nectar Cup -- Pharyngeal -- Pharyngeal- Nectar Straw -- Pharyngeal -- Pharyngeal- Thin Teaspoon -- Pharyngeal -- Pharyngeal- Thin Cup -- Pharyngeal -- Pharyngeal- Thin Straw -- Pharyngeal -- Pharyngeal- Puree Delayed swallow initiation-vallecula;Reduced tongue base retraction;Pharyngeal residue - valleculae Pharyngeal -- Pharyngeal- Mechanical Soft -- Pharyngeal -- Pharyngeal- Regular -- Pharyngeal -- Pharyngeal- Multi-consistency -- Pharyngeal -- Pharyngeal- Pill -- Pharyngeal -- Pharyngeal Comment --  [facility] IP  CERVICAL ESOPHAGEAL PHASE 02/08/2016 Cervical Esophageal Phase WFL Pudding Teaspoon -- Pudding Cup -- Honey Teaspoon -- Honey Cup -- Nectar Teaspoon -- Nectar Cup -- Nectar Straw -- Thin Teaspoon -- Thin Cup -- Thin Straw -- Puree -- Mechanical Soft -- Regular -- Multi-consistency -- Pill -- Cervical Esophageal Comment -- No flowsheet data found. Maxcine Ham 02/01/2016, 3:07 PM  Maxcine Ham, M.A. CCC-SLP [phone]             Ct Head Code Stroke W/o Cm  Result Date: 02/05/2016 CLINICAL DATA:  Code stroke. LEFT arm weakness, facial droop. History of hypertension and atrial fibrillation. EXAM: CT HEAD WITHOUT CONTRAST CT CERVICAL SPINE WITHOUT CONTRAST TECHNIQUE: Multidetector CT imaging of the head and cervical spine was performed following the standard protocol without intravenous contrast. Multiplanar CT image reconstructions of the cervical spine were also generated. COMPARISON:  None. FINDINGS: CT HEAD FINDINGS BRAIN: Blurring of the RIGHT frontal lobe and posterior  insula gray-white matter differentiation. The ventricles and sulci are normal for age. No intraparenchymal hemorrhage, mass effect nor midline shift. Patchy supratentorial white matter hypodensities within normal range for patient's age, though non-specific are most compatible with chronic small vessel ischemic disease. No abnormal extra-axial fluid collections. Basal cisterns are patent. VASCULAR: Dense RIGHT middle cerebral artery. Mild calcific atherosclerosis the carotid siphons. SKULL: No skull fracture. No significant scalp soft tissue swelling. SINUSES/ORBITS: Mild paranasal sinus mucosal thickening. Mastoid air cells are well aerated.The included ocular globes and orbital contents are non-suspicious. OTHER: None. ASPECTS [facility] Stroke Program Early CT Score) - Ganglionic level infarction (caudate, lentiform nuclei, internal capsule, insula, M1-M3 cortex): 5 - Supraganglionic infarction (M4-M6 cortex): 2 Total score (0-10 with 10 being normal): 7 CT CERVICAL SPINE FINDINGS ALIGNMENT: Maintained lordosis. Minimal grade 1 C5-6 anterolisthesis. Mild broad dextroscoliosis could be positional. SKULL BASE AND VERTEBRAE: Cervical vertebral bodies and posterior elements are intact. Intervertebral disc heights preserved. Multilevel severe LEFT facet arthropathy. No destructive bony lesions. C1-2 articulation maintained. SOFT TISSUES AND SPINAL CANAL: Nonacute. Mild calcific atherosclerosis of the carotid bulbs. DISC LEVELS: No significant osseous canal stenosis. Severe LEFT C3-4 through C5-6 neural foraminal narrowing. UPPER CHEST: Fibronodular scarring RIGHT lung apex with calcified granuloma. OTHER: None. IMPRESSION: 1. Acute multifocal small to moderate MCA territory infarct, no hemorrhage. Dense RIGHT middle cerebral artery consistent with thromboembolism. 2. ASPECTS is 7. 3. No acute cervical spine fracture or malalignment. Severe LEFT C3-4 through C5-6 neural foraminal narrowing. Critical Value/emergent  results were called by telephone at the time of interpretation on 01/16/2016 at 4:00 am to Dr. Otelia Limes, NeurologyADELEKE ONI , who verbally acknowledged these results. Electronically Signed   By: Awilda Metro M.D.   On: 02/09/2016 04:10    Scheduled Meds: . aspirin EC  325 mg Oral Daily   Or  . aspirin  300 mg Rectal Daily  . chlorhexidine  15 mL Mouth Rinse BID  . enoxaparin (LOVENOX) injection  40 mg Subcutaneous Q24H  . levothyroxine  50 mcg Intravenous Daily  . mouth rinse  15 mL Mouth Rinse q12n4p   Continuous Infusions:   LOS: 1 day    Time spent: > 35 minutes   Penny Pia, MD [facility] Pager [phone]  If 7PM-7AM, please contact night-coverage [email or web address] Password [facility] 01/14/2016, 3:34 PM

## 2016-01-14 NOTE — Progress Notes (Signed)
STROKE TEAM PROGRESS NOTE   HISTORY OF PRESENT ILLNESS (per record) Jerry [name] is an 83 y.o. male who presents with acute onset of left facial droop with LUE and LLE weakness. ED staff noted right gaze deviation. He was LKW at 11:30 PM 01/12/2016. Jerry [name] found him on the floor after hearing a thud. She noted weakness and felt that her husband was having a stroke. She called EMS at 3:06 AM. Has a-fib on Xarelto with no prior history of stroke. PMHx also includes HTN, dyslipidemia, hypothyroidism and gout. Patient was not administered IV t-PA. He was considered for intervention, but small volume penumbra with risk outweighing benefits given AC. He was admitted to stepdown for further evaluation and treatment.   SUBJECTIVE (INTERVAL HISTORY) Jerry [name] is at bedside. Pt developed tachypnea earlier today. Also seems more drowsy sleepy with delirium, worsening dysarthria and left sided weakness. On mitten bilateral hands. CT repeat showed right MCA large infarct similar to MRI, no new infarct or hemorrhagic transformation or midline shift. CXR no CHF but CBG concerning for hypoxia and respiratory alkalosis. Discussed with Dr. Cena BentonVega and will do CTA chest to rule out PE. Watch for breathing and Jerry [name] is OK if intubation needed.    OBJECTIVE Temp:  [97.6 F (36.4 C)-99 F (37.2 C)] 97.6 F (36.4 C) (01/03 1100) Pulse Rate:  [74-144] 80 (01/03 1400) Cardiac Rhythm: Atrial fibrillation (01/03 0800) Resp:  [24-34] 30 (01/03 1400) BP: (99-175)/(46-136) 155/65 (01/03 1400) SpO2:  [93 %-97 %] 96 % (01/03 1400)  CBC:   Recent Labs Lab 01/24/2016 0350 01/12/2016 0356  WBC 8.8  --   NEUTROABS 5.1  --   HGB 14.4 14.3  HCT 42.4 42.0  MCV 97.7  --   PLT 216  --     Basic Metabolic Panel:   Recent Labs Lab 01/26/2016 0350 02/04/2016 0356 01/14/16 0850  NA 138 140 138  K 3.9 3.8 4.1  CL 103 104 106  CO2 26  --  22  GLUCOSE 155* 151* 172*  BUN 9 12 13   CREATININE 0.92 0.90 0.98  CALCIUM 9.5  --  9.0  MG   --   --  1.9    Lipid Panel:     Component Value Date/Time   CHOL 139 01/19/2016 0730   TRIG 94 02/05/2016 0730   HDL 51 01/30/2016 0730   CHOLHDL 2.7 01/18/2016 0730   VLDL 19 02/04/2016 0730   LDLCALC 69 02/07/2016 0730   HgbA1c:  Lab Results  Component Value Date   HGBA1C 6.6 (H) 01/18/2016   Urine Drug Screen: No results found for: LABOPIA, COCAINSCRNUR, LABBENZ, AMPHETMU, THCU, LABBARB    IMAGING I have personally reviewed the radiological images below and agree with the radiology interpretations.  Ct Head Code Stroke W/o Cm 01/24/2016 1. Acute multifocal small to moderate MCA territory infarct, no hemorrhage. Dense RIGHT middle cerebral artery consistent with thromboembolism. 2. ASPECTS is 7.   Ct Angio Head W Or Wo Contrast 02/05/2016 Emergent RIGHT M1 large vessel occlusion. Moderate stenosis RIGHT P3 segment.   Ct Angio Neck W And/or Wo Contrast 01/27/2016 Atherosclerosis without hemodynamically significant stenosis or acute vascular process.   Ct Cervical Spine Wo Contrast 02/01/2016 No acute cervical spine fracture or malalignment. Severe LEFT C3-4 through C5-6 neural foraminal narrowing.   Ct Cerebral Perfusion W Contrast 01/12/2016 Acute large RIGHT MCA territory infarct, with additional 72 cc penumbra/brain at risk.   Ct Head Wo Contrast 01/14/2016 IMPRESSION: Developing hypodensity in the right MCA  territory compatible with acute infarct. Similar territory to MRI yesterday. Negative for hemorrhage.   Mr Brain Wo Contrast 02-05-16 IMPRESSION: 1. Large evolving acute right MCA territory infarct as above. Associated petechial hemorrhage without frank hemorrhagic transformation. No significant mass effect at this time. 2. Underlying age-related cerebral atrophy with mild chronic microvascular ischemic disease.    Dg Chest Port 1 View 01/14/2016 IMPRESSION: Mild cardiac enlargement. No edema or consolidation. There is aortic atherosclerosis.   TTE  pending   PHYSICAL EXAM  Temp:  [97.6 F (36.4 C)-99 F (37.2 C)] 97.6 F (36.4 C) (01/03 1100) Pulse Rate:  [74-144] 80 (01/03 1400) Resp:  [24-34] 30 (01/03 1400) BP: (99-175)/(46-136) 155/65 (01/03 1400) SpO2:  [93 %-97 %] 96 % (01/03 1400)  General - Well nourished, well developed, in significant respiratory distress.  Ophthalmologic - Fundi not visualized due to noncooperation.  Cardiovascular - irregularly irregular heart rate and rhythm.  Neuro - Drowsy sleepy and not able to answer orientation questions. Language exam not cooperative due to respiratory distress. However, severe dysarthria and left side neglect. Eyes deviated to the right, not able to cross over midline. Eyes closed and barely open on request. PERRL. Left facial droop. Severe dysarthria. Spontaneous movement at RUE and RLE, 3/5 LUE and 3+/5 LLE. Bulk was normal and fasciculations were absent. Muscle tone was normal. DTR 1+ in all extremities and he had no pathological reflexes. Sensation, coordination and gait not tested due to respiratory distress and noncooperation   ASSESSMENT/PLAN Jerry [name] is a 83 y.o. male with history of AF on xarelto, HTN, HLD, hypothyroidism and gout presenting with L side weakness and facial droop. He did not receive IV t-PA.   Stroke:  Non-dominant right MCA large infarct embolic most likely source secondary to known atrial fibrillation   Resultant  left hemiparesis and left neglect  Code Stroke CT  R MCA territory infarcts. Dense R MCA  CTA head  Emergent R M1 large vessel occlusion, moderate R P3 stenosis  CTA neck  Atherosclerosis w/o hemodynamic stenosis  CTP  Large R MCA core infarct, and small penumbra   MRI  Large R MCA infarct but no midline shift  Repeat CT - evolving right MCA infarct and no hemorrhage  2D Echo  pending   LDL 69  HgbA1c 6.6  lovenox for VTE prophylaxis  Xarelto (rivaroxaban) daily prior to admission, now on aspirin 300 mg  suppository daily due to large infarct. Consider to switch to eliquis in one week.   Patient counseled to be compliant with his antithrombotic medications  Ongoing aggressive stroke risk factor management  Therapy recommendations:  pending   Disposition:  pending   Respiratory distress  CXR negative  ABG concerning for hypoxia  CTA chest pending to rule out PE  Jerry [name] is ok for intubation if needed.  Consider CCM consult if needed  Atrial Fibrillation  Home anticoagulation:  Xarelto (rivaroxaban) daily   Failed Xarelto, now on aspirin 300 supp given large infarct  Consider to swtich to eliquis in on week  Rate not in good control  Add metoprolol 25mg  bid  Resume digoxin    Hypertension  Stable Permissive hypertension (OK if < 220/120) but gradually normalize in 5-7 days  On metoprolol Long-term BP goal normotensive  Hyperlipidemia  Home meds:  lipitor 40 and lovaza  Resumed lipitor 40  LDL 69, goal < 70  Continue statin at discharge  Other Stroke Risk Factors  Advanced age  Family hx stroke (father)  Other Active Problems  Hypothyroidism - on synthroid  Hospital day # 1  This patient is critically ill due to large right MCA infarct, afib with RVR, significant respiratory distress, dysphagia and at significant risk of neurological worsening, death form recurrent stroke, heart failure, hemorrhagic transformation, aspiration pneumonia, PE and respiratory failure. This patient's care requires constant monitoring of vital signs, hemodynamics, respiratory and cardiac monitoring, review of multiple databases, neurological assessment, discussion with family, other specialists and medical decision making of high complexity. I spent 45 minutes of neurocritical care time in the care of this patient.   Marvel Plan, MD PhD Stroke Neurology 01/14/2016 4:29 PM   To contact Stroke Continuity provider, please refer to [email or web address]. After hours, contact General  Neurology

## 2016-01-14 NOTE — Progress Notes (Signed)
Pt required NT suctioning due to build up of secretions and unable to cough.  Pt Sp02 continued to drop on the NRB.Marland Kitchen. Pt placed on bipap to help wob.  MD called and rapid response at bedside as well.  Rt will continue to monitor.

## 2016-01-14 NOTE — Progress Notes (Signed)
SLP Cancellation Note  Patient Details Name: Jerry FewHarold D Marasigan MRN: [id number] DOB: 1931-12-29   Cancelled treatment:       Reason Eval/Treat Not Completed: Medical issues which prohibited therapy. Pt with change in status this morning, now awaiting repeat CT Head. Discussed with RN - would keep him NPO for now. Will f/u as able.   Maxcine Hamaiewonsky, Cordarius Benning 01/14/2016, 9:48 AM  Maxcine HamLaura Paiewonsky, M.A. CCC-SLP [phone]

## 2016-01-14 NOTE — Progress Notes (Signed)
Notified dr Cena Bentonvega of patient having a 3.2 sec pause.

## 2016-01-14 NOTE — Progress Notes (Signed)
PT Cancellation Note  Patient Details Name: Jerry FewHarold D Gabor MRN: [id number] DOB: 01-08-1932   Cancelled Treatment:    Reason Eval/Treat Not Completed: Medical issues which prohibited therapy.  Pt with RR in upper 20s and low 30s while at rest supine in bed.  Attempted to arouse pt with pt opening eyes and having increased difficulty focusing on PT with RR staying in 30s.  No verbalizations today.  RN and stroke team made aware.  Will f/u as appropriate.     Alison MurrayMegan F Edynn Gillock 01/14/2016, 9:06 AM

## 2016-01-14 NOTE — Progress Notes (Addendum)
I will follow pt's progress from a distance. Pt likely will need SNF rehab. [phone]

## 2016-01-14 NOTE — Progress Notes (Signed)
Pt HR (Afib)  bouncing from 80-90's to 130-150's on many different occassions various times of the night. MD paged. Ordered 5mg  lopressor IV and 250 ml bolus. After lopressor HR 70-90's. Will continue to monitor.

## 2016-01-14 NOTE — Consult Note (Signed)
Physical Medicine and Rehabilitation Consult Reason for Consult: Large acute right MCA territory infarct Referring Physician: [facility]   HPI: Lorna FewHarold D Pring is a 83 y.o. right handed male with history of hypertension, hyperlipidemia, PAF maintained on Xarelto. Per chart review and patient's wife, patient lives with spouse. Independent prior to admission. Patient very active and driving prior to admission. Presented 31-Dec-2016 with left-sided weakness with right gaze preference. CT/MRI showed large evolving acute right MCA territory infarct. Patient did not receive TPA. CT cervical spine showed severe left C3-4 through C5-6 neural foraminal narrowing. CTA of head and neck showed emergent right M1 large vessel occlusion. Echocardiogram is pending. Neurology consulted presently on aspirin for CVA prophylaxis. Subcutaneous Lovenox for DVT prophylaxis.  Review of Systems  Unable to perform ROS: Acuity of condition   Past Medical History:  Diagnosis Date  . Dyslipidemia   . Gout   . HTN (hypertension)   . Hypothyroidism   . Paroxysmal atrial fibrillation [facility](HCC)    Past Surgical History:  Procedure Laterality Date  . TONSILLECTOMY AND ADENOIDECTOMY  AGE 45   Family History  Problem Relation Age of Onset  . Stroke Father   . Hypertension Father   . Other Mother     CARDIOMEGALY  . Hypertension Mother   . Lung cancer Mother   . Diabetes Brother   . COPD Child    Social History:  reports that he has never smoked. He has never used smokeless tobacco. He reports that he does not drink alcohol or use drugs. Allergies:  Allergies  Allergen Reactions  . Cardizem [Diltiazem Hcl] Rash   Medications Prior to Admission  Medication Sig Dispense Refill  . alfuzosin (UROXATRAL) 10 MG 24 hr tablet Take 10 mg by mouth daily.    Marland Kitchen. allopurinol (ZYLOPRIM) 300 MG tablet Take 300 mg by mouth daily.      Marland Kitchen. atorvastatin (LIPITOR) 40 MG tablet Take 40 mg by mouth daily.      Marland Kitchen. CALCIUM PO Take 1  tablet by mouth daily.     . Cyanocobalamin (VITAMIN B-12 PO) Take 1 tablet by mouth daily.    . digoxin (LANOXIN) 0.125 MG tablet Take 0.125 mg by mouth daily.    Marland Kitchen. ibandronate (BONIVA) 150 MG tablet Take 1 tablet by mouth every 30 (thirty) days. TAKE 1 TAB ONCE A MONTH  0  . levothyroxine (SYNTHROID, LEVOTHROID) 100 MCG tablet Take 100 mcg by mouth daily.      Marland Kitchen. lisinopril-hydrochlorothiazide (PRINZIDE,ZESTORETIC) 20-12.5 MG per tablet Take 1 tablet by mouth daily. TAKE 1 TAB DALY  1  . Multiple Vitamin (MULTIVITAMIN) tablet Take 1 tablet by mouth daily.      Marland Kitchen. omega-3 acid ethyl esters (LOVAZA) 1 G capsule Take 2 g by mouth daily.     . traMADol (ULTRAM) 50 MG tablet Take 50 mg by mouth 2 (two) times daily.     Carlena Hurl. XARELTO 20 MG TABS tablet Take 20 mg by mouth daily with supper.       Home: Home Living Family/patient expects to be discharged to:: Inpatient rehab Living Arrangements: Spouse/significant other Available Help at Discharge: Family Bathroom Shower/Tub: Walk-in shower Bathroom Toilet: Handicapped height Home Equipment: Grab bars - tub/shower  Lives With: Spouse  Functional History: Prior Function Level of Independence: Independent Comments: Pt is very active and drives Functional Status:  Mobility: Bed Mobility Overal bed mobility: Needs Assistance Bed Mobility: Supine to Sit, Sit to Supine Supine to sit: Mod assist Sit  to supine: Mod assist General bed mobility comments: Assist to move Lt LE off bed and to lift Lt LE onto bed  Transfers Overall transfer level: Needs assistance Equipment used: 2 person hand held assist Transfers: Sit to/from Stand Sit to Stand: Mod assist, +2 physical assistance General transfer comment: pt needs Bil feet blocked and facilitating balance over BOS.  pt leans heavily to L side and posteriorly.  HR up to 158 nonsustained.        ADL: ADL Overall ADL's : Needs assistance/impaired Eating/Feeding: NPO Grooming: Wash/dry hands,  Wash/dry face, Oral care, Brushing hair, Minimal assistance, Sitting Upper Body Bathing: Moderate assistance, Sitting Upper Body Bathing Details (indicate cue type and reason): cues for thoroughness and to attend to Lt side  Lower Body Bathing: Maximal assistance, Sit to/from stand Upper Body Dressing : Maximal assistance, Sitting Lower Body Dressing: Total assistance, Sit to/from stand Lower Body Dressing Details (indicate cue type and reason): Pt attempts to doff Rt sock, but demonstrates difficulty hooking thumb and fingers in the cuff of the sock - fatigues  Toilet Transfer: Moderate assistance, +2 for physical assistance, Stand-pivot, BSC Functional mobility during ADLs: Moderate assistance, +2 for physical assistance General ADL Comments: HR to 152 non sustained while EOB   Cognition: Cognition Overall Cognitive Status: Impaired/Different from baseline Arousal/Alertness:  (drowsy) Orientation Level: Other (comment) (UTA, pt not speaking) Attention: Sustained Sustained Attention: Impaired Sustained Attention Impairment: Verbal basic, Functional basic Awareness: Impaired Awareness Impairment: Intellectual impairment, Emergent impairment, Anticipatory impairment Problem Solving: Impaired Problem Solving Impairment: Functional basic Safety/Judgment: Impaired Cognition Arousal/Alertness: Awake/alert, Lethargic Behavior During Therapy: Flat affect Overall Cognitive Status: Impaired/Different from baseline Area of Impairment: Attention, Following commands, Safety/judgement, Awareness, Problem solving Current Attention Level: Sustained Following Commands: Follows one step commands consistently, Follows multi-step commands inconsistently Safety/Judgement: Decreased awareness of safety, Decreased awareness of deficits Awareness: Intellectual Problem Solving: Slow processing, Difficulty sequencing, Requires verbal cues, Requires tactile cues General Comments: Pt is fully oriented.  He  feels he is at baseline from CVA, does not recognize deficits even when they are pointed out to him.  He is very distracted by lines and wires   Blood pressure (!) 164/79, pulse 80, temperature 98.7 F (37.1 C), temperature source Axillary, resp. rate (!) 30, height 6\' 1"  (1.854 m), weight 87 kg (191 lb 12.8 oz), SpO2 93 %. Physical Exam  Vitals reviewed. Constitutional: He appears well-developed and well-nourished.  83 year old right-handed male with bilateral wrist restraints in place  HENT:  Head: Normocephalic and atraumatic.  Eyes: Right eye exhibits no discharge. Left eye exhibits no discharge.  Pupils sluggish to light  Neck: Normal range of motion. Neck supple. No tracheal deviation present. No thyromegaly present.  Cardiovascular:  Irregularly irregular  Respiratory: Breath sounds normal. He is in respiratory distress.  +Tachypnea  GI: Soft. Bowel sounds are normal. He exhibits no distension.  Musculoskeletal: He exhibits no edema or tenderness.  Neurological:  Patient is restless and kept his eyes closed, difficult to arouse.  He remained nonverbal did not follow commands.  Exam was limited due to patient participation  Skin: Skin is warm and dry.  Psychiatric:  Unable to assess due to mentation    Results for orders placed or performed during the hospital encounter of Feb 02, 2016 (from the past 24 hour(s))  MRSA PCR Screening     Status: None   Collection Time: 02-02-16  7:09 AM  Result Value Ref Range   MRSA by PCR NEGATIVE NEGATIVE  Troponin I  Status: None   Collection Time: 02/03/2016  7:30 AM  Result Value Ref Range   Troponin I <0.03 <0.03 ng/mL  TSH     Status: None   Collection Time: 01/26/2016  7:30 AM  Result Value Ref Range   TSH 1.323 0.350 - 4.500 uIU/mL  Hemoglobin A1c     Status: Abnormal   Collection Time: 01/26/2016  7:30 AM  Result Value Ref Range   Hgb A1c MFr Bld 6.6 (H) 4.8 - 5.6 %   Mean Plasma Glucose 143 mg/dL  Lipid panel     Status: None    Collection Time: 02/05/2016  7:30 AM  Result Value Ref Range   Cholesterol 139 0 - 200 mg/dL   Triglycerides 94 <161 mg/dL   HDL 51 >09 mg/dL   Total CHOL/HDL Ratio 2.7 RATIO   VLDL 19 0 - 40 mg/dL   LDL Cholesterol 69 0 - 99 mg/dL  Glucose, capillary     Status: Abnormal   Collection Time: 02/07/2016  2:35 PM  Result Value Ref Range   Glucose-Capillary 144 (H) 65 - 99 mg/dL   Comment 1 Notify RN    Comment 2 Document in Chart    Ct Angio Head W Or Wo Contrast  Result Date: 01/18/2016 CLINICAL DATA:  LEFT arm weakness, facial droop. History of hypertension and atrial fibrillation. Follow-up acute RIGHT MCA territory infarct. EXAM: CT ANGIOGRAPHY HEAD AND NECK TECHNIQUE: Multiphase CT imaging of the brain was performed following IV bolus contrast injection. Subsequent parametric perfusion maps were calculated using RAPID software.Multidetector CT imaging of the head and neck was performed using the standard protocol during bolus administration of intravenous contrast. Multiplanar CT image reconstructions and MIPs were obtained to evaluate the vascular anatomy. Carotid stenosis measurements (when applicable) are obtained utilizing NASCET criteria, using the distal internal carotid diameter as the denominator. CONTRAST:  90 cc Isovue 370 COMPARISON:  CT HEAD and cervical spine January 13, 2016 at 0355 hours CT HEAD January 13, 2016 at 0352 hours FINDINGS: CT Brain Perfusion Findings: CBF (<30%) Volume: Perfusion (Tmax>6.0s) volume: Mismatch Volume: 72mL Infarction Location:1.5 CTA NECK AORTIC ARCH: Normal appearance of the thoracic arch, normal branch pattern. Mild calcific atherosclerosis. The origins of the innominate, left Common carotid artery and subclavian artery are widely patent. RIGHT CAROTID SYSTEM: Common carotid artery is widely patent, coursing in a straight line fashion. Minimal eccentric calcific atherosclerosis. Normal appearance of the carotid bifurcation without hemodynamically  significant stenosis by NASCET criteria ; trace calcific atherosclerosis. Slight luminal irregularity of the distal RIGHT internal carotid artery attributed to atherosclerosis. LEFT CAROTID SYSTEM: Common carotid artery is widely patent, coursing in a straight line fashion. Normal appearance of the carotid bifurcation without hemodynamically significant stenosis by NASCET criteria, moderate calcific atherosclerosis. Normal appearance of the included internal carotid artery. VERTEBRAL ARTERIES:Left vertebral artery is dominant. Normal appearance of the vertebral arteries, which appear widely patent. Mild extrinsic deformity of the vertebral arteries due to degenerative cervical spine. SKELETON: No acute osseous process though bone windows have not been submitted. Please see CT of cervical spine from today, reported separately for dedicated findings. OTHER NECK: Soft tissues of the neck are non-acute though, not tailored for evaluation. CTA HEAD ANTERIOR CIRCULATION: Normal appearance of the cervical internal carotid arteries, petrous, cavernous and supra clinoid internal carotid arteries. Mild calcific atherosclerosis of the carotid siphons. Occluded proximal RIGHT M1 segment. Paucity of mid and distal vessels. Widely patent anterior communicating artery. Normal appearance of the anterior cerebral arteries.  No large vessel occlusion, hemodynamically significant stenosis, dissection, luminal irregularity, contrast extravasation or aneurysm. POSTERIOR CIRCULATION: Normal appearance of the vertebral arteries, vertebrobasilar junction and basilar artery, as well as main branch vessels. Normal appearance of the posterior cerebral arteries. Moderate stenosis proximal RIGHT P3 segment. No large vessel occlusion, hemodynamically significant stenosis, dissection, luminal irregularity, contrast extravasation or aneurysm. VENOUS SINUSES: Major dural venous sinuses are patent though not tailored for evaluation on this  angiographic examination. ANATOMIC VARIANTS: Supernumerary anterior cerebral artery arising from LEFT A1-2 junction. DELAYED PHASE: Not performed. IMPRESSION: CT perfusion: Acute large RIGHT MCA territory infarct, with additional 72 cc penumbra/brain at risk. CTA NECK: Atherosclerosis without hemodynamically significant stenosis or acute vascular process. CTA HEAD:  Emergent RIGHT M1 large vessel occlusion. Moderate stenosis RIGHT P3 segment. Acute findings discussed with and reconfirmed by Dr.Lindzen, Neurology on 01/31/2016 at 4:52 am. Electronically Signed   By: Awilda Metro M.D.   On: 02/10/2016 04:53   Ct Angio Neck W And/or Wo Contrast  Result Date: 02/01/2016 CLINICAL DATA:  LEFT arm weakness, facial droop. History of hypertension and atrial fibrillation. Follow-up acute RIGHT MCA territory infarct. EXAM: CT ANGIOGRAPHY HEAD AND NECK TECHNIQUE: Multiphase CT imaging of the brain was performed following IV bolus contrast injection. Subsequent parametric perfusion maps were calculated using RAPID software.Multidetector CT imaging of the head and neck was performed using the standard protocol during bolus administration of intravenous contrast. Multiplanar CT image reconstructions and MIPs were obtained to evaluate the vascular anatomy. Carotid stenosis measurements (when applicable) are obtained utilizing NASCET criteria, using the distal internal carotid diameter as the denominator. CONTRAST:  90 cc Isovue 370 COMPARISON:  CT HEAD and cervical spine January 13, 2016 at 0355 hours CT HEAD January 13, 2016 at 0352 hours FINDINGS: CT Brain Perfusion Findings: CBF (<30%) Volume: Perfusion (Tmax>6.0s) volume: Mismatch Volume: 72mL Infarction Location:1.5 CTA NECK AORTIC ARCH: Normal appearance of the thoracic arch, normal branch pattern. Mild calcific atherosclerosis. The origins of the innominate, left Common carotid artery and subclavian artery are widely patent. RIGHT CAROTID SYSTEM: Common  carotid artery is widely patent, coursing in a straight line fashion. Minimal eccentric calcific atherosclerosis. Normal appearance of the carotid bifurcation without hemodynamically significant stenosis by NASCET criteria ; trace calcific atherosclerosis. Slight luminal irregularity of the distal RIGHT internal carotid artery attributed to atherosclerosis. LEFT CAROTID SYSTEM: Common carotid artery is widely patent, coursing in a straight line fashion. Normal appearance of the carotid bifurcation without hemodynamically significant stenosis by NASCET criteria, moderate calcific atherosclerosis. Normal appearance of the included internal carotid artery. VERTEBRAL ARTERIES:Left vertebral artery is dominant. Normal appearance of the vertebral arteries, which appear widely patent. Mild extrinsic deformity of the vertebral arteries due to degenerative cervical spine. SKELETON: No acute osseous process though bone windows have not been submitted. Please see CT of cervical spine from today, reported separately for dedicated findings. OTHER NECK: Soft tissues of the neck are non-acute though, not tailored for evaluation. CTA HEAD ANTERIOR CIRCULATION: Normal appearance of the cervical internal carotid arteries, petrous, cavernous and supra clinoid internal carotid arteries. Mild calcific atherosclerosis of the carotid siphons. Occluded proximal RIGHT M1 segment. Paucity of mid and distal vessels. Widely patent anterior communicating artery. Normal appearance of the anterior cerebral arteries. No large vessel occlusion, hemodynamically significant stenosis, dissection, luminal irregularity, contrast extravasation or aneurysm. POSTERIOR CIRCULATION: Normal appearance of the vertebral arteries, vertebrobasilar junction and basilar artery, as well as main branch vessels. Normal appearance of the posterior cerebral arteries. Moderate stenosis proximal  RIGHT P3 segment. No large vessel occlusion, hemodynamically significant  stenosis, dissection, luminal irregularity, contrast extravasation or aneurysm. VENOUS SINUSES: Major dural venous sinuses are patent though not tailored for evaluation on this angiographic examination. ANATOMIC VARIANTS: Supernumerary anterior cerebral artery arising from LEFT A1-2 junction. DELAYED PHASE: Not performed. IMPRESSION: CT perfusion: Acute large RIGHT MCA territory infarct, with additional 72 cc penumbra/brain at risk. CTA NECK: Atherosclerosis without hemodynamically significant stenosis or acute vascular process. CTA HEAD:  Emergent RIGHT M1 large vessel occlusion. Moderate stenosis RIGHT P3 segment. Acute findings discussed with and reconfirmed by Dr.Lindzen, Neurology on January 22, 2016 at 4:52 am. Electronically Signed   By: Awilda Metro M.D.   On: January 22, 2016 04:53   Ct Cervical Spine Wo Contrast  Result Date: 2016-01-22 CLINICAL DATA:  Code stroke. LEFT arm weakness, facial droop. History of hypertension and atrial fibrillation. EXAM: CT HEAD WITHOUT CONTRAST CT CERVICAL SPINE WITHOUT CONTRAST TECHNIQUE: Multidetector CT imaging of the head and cervical spine was performed following the standard protocol without intravenous contrast. Multiplanar CT image reconstructions of the cervical spine were also generated. COMPARISON:  None. FINDINGS: CT HEAD FINDINGS BRAIN: Blurring of the RIGHT frontal lobe and posterior insula gray-white matter differentiation. The ventricles and sulci are normal for age. No intraparenchymal hemorrhage, mass effect nor midline shift. Patchy supratentorial white matter hypodensities within normal range for patient's age, though non-specific are most compatible with chronic small vessel ischemic disease. No abnormal extra-axial fluid collections. Basal cisterns are patent. VASCULAR: Dense RIGHT middle cerebral artery. Mild calcific atherosclerosis the carotid siphons. SKULL: No skull fracture. No significant scalp soft tissue swelling. SINUSES/ORBITS: Mild paranasal sinus  mucosal thickening. Mastoid air cells are well aerated.The included ocular globes and orbital contents are non-suspicious. OTHER: None. ASPECTS [facility] Stroke Program Early CT Score) - Ganglionic level infarction (caudate, lentiform nuclei, internal capsule, insula, M1-M3 cortex): 5 - Supraganglionic infarction (M4-M6 cortex): 2 Total score (0-10 with 10 being normal): 7 CT CERVICAL SPINE FINDINGS ALIGNMENT: Maintained lordosis. Minimal grade 1 C5-6 anterolisthesis. Mild broad dextroscoliosis could be positional. SKULL BASE AND VERTEBRAE: Cervical vertebral bodies and posterior elements are intact. Intervertebral disc heights preserved. Multilevel severe LEFT facet arthropathy. No destructive bony lesions. C1-2 articulation maintained. SOFT TISSUES AND SPINAL CANAL: Nonacute. Mild calcific atherosclerosis of the carotid bulbs. DISC LEVELS: No significant osseous canal stenosis. Severe LEFT C3-4 through C5-6 neural foraminal narrowing. UPPER CHEST: Fibronodular scarring RIGHT lung apex with calcified granuloma. OTHER: None. IMPRESSION: 1. Acute multifocal small to moderate MCA territory infarct, no hemorrhage. Dense RIGHT middle cerebral artery consistent with thromboembolism. 2. ASPECTS is 7. 3. No acute cervical spine fracture or malalignment. Severe LEFT C3-4 through C5-6 neural foraminal narrowing. Critical Value/emergent results were called by telephone at the time of interpretation on 22-Jan-2016 at 4:00 am to Dr. Otelia Limes, NeurologyADELEKE ONI , who verbally acknowledged these results. Electronically Signed   By: Awilda Metro M.D.   On: 01/22/2016 04:10   Mr Brain Wo Contrast  Result Date: Jan 22, 2016 CLINICAL DATA:  Initial evaluation for acute stroke. EXAM: MRI HEAD WITHOUT CONTRAST TECHNIQUE: Multiplanar, multiecho pulse sequences of the brain and surrounding structures were obtained without intravenous contrast. COMPARISON:  Prior studies from earlier the same day. FINDINGS: Brain: Study moderately  degraded by motion artifact. Extensive confluent restricted diffusion involving the majority of the right MCA territory, corresponding with previously seen perfusion defect on prior study, compatible with acute ischemic stroke. Involvement of the right basal ganglia noted. Scattered areas of petechial hemorrhage within the area of infarction  without frank hemorrhagic transformation. Minimal attenuation of the right lateral ventricle without significant mass effect. No midline shift. No other evidence for acute infarction. Underlying cerebral atrophy noted. Periventricular T2/FLAIR hyperintensity compatible with chronic microvascular ischemic disease. Chronic microvascular ischemic changes also noted within the pons. No mass lesion, midline shift, or mass effect. No hydrocephalus. No extra-axial fluid collection. Major dural sinuses are grossly patent. Pituitary gland within normal limits. Vascular: Right M1 segment appears occluded at its mid- distal aspect. This is better evaluated on prior CTA. Major intracranial vascular flow voids otherwise maintained. Skull and upper cervical spine: Craniocervical junction within normal limits. Upper cervical spine unremarkable. Bone marrow signal intensity within normal limits. No scalp soft tissue abnormality. Sinuses/Orbits: Globes and orbital soft tissues within normal limits. No made of the patient's the aids which is directed to the right. Scattered mucosal thickening throughout the paranasal sinuses. No air-fluid level to suggest active IMPRESSION: 1. Large evolving acute right MCA territory infarct as above. Associated petechial hemorrhage without frank hemorrhagic transformation. No significant mass effect at this time. 2. Underlying age-related cerebral atrophy with mild chronic microvascular ischemic disease. Electronically Signed   By: Rise Mu M.D.   On: 01/17/2016 20:48   Ct Cerebral Perfusion W Contrast  Result Date: 2016/01/17 CLINICAL DATA:  LEFT  arm weakness, facial droop. History of hypertension and atrial fibrillation. Follow-up acute RIGHT MCA territory infarct. EXAM: CT ANGIOGRAPHY HEAD AND NECK TECHNIQUE: Multiphase CT imaging of the brain was performed following IV bolus contrast injection. Subsequent parametric perfusion maps were calculated using RAPID software.Multidetector CT imaging of the head and neck was performed using the standard protocol during bolus administration of intravenous contrast. Multiplanar CT image reconstructions and MIPs were obtained to evaluate the vascular anatomy. Carotid stenosis measurements (when applicable) are obtained utilizing NASCET criteria, using the distal internal carotid diameter as the denominator. CONTRAST:  90 cc Isovue 370 COMPARISON:  CT HEAD and cervical spine Jan 17, 2016 at 0355 hours CT HEAD 01/17/2016 at 0352 hours FINDINGS: CT Brain Perfusion Findings: CBF (<30%) Volume: Perfusion (Tmax>6.0s) volume: Mismatch Volume: 72mL Infarction Location:1.5 CTA NECK AORTIC ARCH: Normal appearance of the thoracic arch, normal branch pattern. Mild calcific atherosclerosis. The origins of the innominate, left Common carotid artery and subclavian artery are widely patent. RIGHT CAROTID SYSTEM: Common carotid artery is widely patent, coursing in a straight line fashion. Minimal eccentric calcific atherosclerosis. Normal appearance of the carotid bifurcation without hemodynamically significant stenosis by NASCET criteria ; trace calcific atherosclerosis. Slight luminal irregularity of the distal RIGHT internal carotid artery attributed to atherosclerosis. LEFT CAROTID SYSTEM: Common carotid artery is widely patent, coursing in a straight line fashion. Normal appearance of the carotid bifurcation without hemodynamically significant stenosis by NASCET criteria, moderate calcific atherosclerosis. Normal appearance of the included internal carotid artery. VERTEBRAL ARTERIES:Left vertebral artery is  dominant. Normal appearance of the vertebral arteries, which appear widely patent. Mild extrinsic deformity of the vertebral arteries due to degenerative cervical spine. SKELETON: No acute osseous process though bone windows have not been submitted. Please see CT of cervical spine from today, reported separately for dedicated findings. OTHER NECK: Soft tissues of the neck are non-acute though, not tailored for evaluation. CTA HEAD ANTERIOR CIRCULATION: Normal appearance of the cervical internal carotid arteries, petrous, cavernous and supra clinoid internal carotid arteries. Mild calcific atherosclerosis of the carotid siphons. Occluded proximal RIGHT M1 segment. Paucity of mid and distal vessels. Widely patent anterior communicating artery. Normal appearance of the anterior cerebral  arteries. No large vessel occlusion, hemodynamically significant stenosis, dissection, luminal irregularity, contrast extravasation or aneurysm. POSTERIOR CIRCULATION: Normal appearance of the vertebral arteries, vertebrobasilar junction and basilar artery, as well as main branch vessels. Normal appearance of the posterior cerebral arteries. Moderate stenosis proximal RIGHT P3 segment. No large vessel occlusion, hemodynamically significant stenosis, dissection, luminal irregularity, contrast extravasation or aneurysm. VENOUS SINUSES: Major dural venous sinuses are patent though not tailored for evaluation on this angiographic examination. ANATOMIC VARIANTS: Supernumerary anterior cerebral artery arising from LEFT A1-2 junction. DELAYED PHASE: Not performed. IMPRESSION: CT perfusion: Acute large RIGHT MCA territory infarct, with additional 72 cc penumbra/brain at risk. CTA NECK: Atherosclerosis without hemodynamically significant stenosis or acute vascular process. CTA HEAD:  Emergent RIGHT M1 large vessel occlusion. Moderate stenosis RIGHT P3 segment. Acute findings discussed with and reconfirmed by Dr.Lindzen, Neurology on 02-04-16 at  4:52 am. Electronically Signed   By: Awilda Metro M.D.   On: 02/04/16 04:53   Dg Swallowing Func-speech Pathology  Result Date: February 04, 2016 Objective Swallowing Evaluation: Type of Study: MBS-Modified Barium Swallow Study Patient Details Name: KEYLER HOGE MRN: [id number] Date of Birth: Sep 05, 1931 Today's Date: 02-04-2016 Time: SLP Start Time (ACUTE ONLY): 1355-SLP Stop Time (ACUTE ONLY): 1416 SLP Time Calculation (min) (ACUTE ONLY): 21 min Past Medical History: Past Medical History: Diagnosis Date . Dyslipidemia  . Gout  . HTN (hypertension)  . Hypothyroidism  . Paroxysmal atrial fibrillation [facility])  Past Surgical History: Past Surgical History: Procedure Laterality Date . TONSILLECTOMY AND ADENOIDECTOMY  AGE 83 HPI: Pt presents with R Ischemic MCA Infarct. Pt with hx of A-fib, HTN, and Gout.  Subjective: pt drowsy but alert enough to try POs, responsive to questions Assessment / Plan / Recommendation CHL IP CLINICAL IMPRESSIONS 02/04/2016 Therapy Diagnosis Moderate oral phase dysphagia;Moderate pharyngeal phase dysphagia  Clinical Impression Pt has a moderate oropharyngeal dysphagia due to sensorimotor and cognitive deficits. Weak lingual manipulation results in decreased bolus cohesion and delayed transit. Thickened liquids spill to the pyriform sinuses, with pt often trying to talk while liquids are continuing to spill into his pharynx. Penetration occurs consistently even with small spoonfuls of thickened liquids, and elicited a throat clear only one time. Multiple cues were needed for effortful cough to clear penetration each time if occurred. Penetration appeared to occur more deeply as residue built up more in his valleculae. No airway compromise occurred with trials of puree, although there is moderate vallecular residue that remains post-swallow. Cues for a second swallow can reduce but not eliminate this residue, and pt cognitively is not able to follow commands to try additional compensatory  strategies. Recommend that pt remain NPO except for meds crushed in puree with use of multiple swallows per bolus. Pt may also have a small, pureed snack when fully alert to facilitate pharyngeal strengthening.  Impact on safety and function Moderate aspiration risk   CHL IP TREATMENT RECOMMENDATION 02-04-2016 Treatment Recommendations Therapy as outlined in treatment plan below   Prognosis 2016-02-04 Prognosis for Safe Diet Advancement Good Barriers to Reach Goals Cognitive deficits Barriers/Prognosis Comment -- CHL IP DIET RECOMMENDATION 02-04-16 SLP Diet Recommendations NPO except meds;Other (Comment) Liquid Administration via -- Medication Administration Crushed with puree Compensations Slow rate;Small sips/bites;Multiple dry swallows after each bite/sip Postural Changes Remain semi-upright after after feeds/meals (Comment);Seated upright at 90 degrees   CHL IP OTHER RECOMMENDATIONS February 04, 2016 Recommended Consults -- Oral Care Recommendations Oral care QID Other Recommendations Have oral suction available;Remove water pitcher;Prohibited food (jello, ice cream, thin soups)   CHL IP  FOLLOW UP RECOMMENDATIONS 01-29-2016 Follow up Recommendations Inpatient Rehab   CHL IP FREQUENCY AND DURATION 01/29/2016 Speech Therapy Frequency (ACUTE ONLY) min 2x/week Treatment Duration 2 weeks      CHL IP ORAL PHASE January 29, 2016 Oral Phase Impaired Oral - Pudding Teaspoon -- Oral - Pudding Cup -- Oral - Honey Teaspoon Weak lingual manipulation;Delayed oral transit;Decreased bolus cohesion Oral - Honey Cup -- Oral - Nectar Teaspoon Weak lingual manipulation;Delayed oral transit;Decreased bolus cohesion Oral - Nectar Cup -- Oral - Nectar Straw -- Oral - Thin Teaspoon -- Oral - Thin Cup -- Oral - Thin Straw -- Oral - Puree Weak lingual manipulation;Delayed oral transit;Decreased bolus cohesion Oral - Mech Soft -- Oral - Regular -- Oral - Multi-Consistency -- Oral - Pill -- Oral Phase - Comment --  CHL IP PHARYNGEAL PHASE January 29, 2016 Pharyngeal Phase  Impaired Pharyngeal- Pudding Teaspoon -- Pharyngeal -- Pharyngeal- Pudding Cup -- Pharyngeal -- Pharyngeal- Honey Teaspoon Delayed swallow initiation-pyriform sinuses;Reduced tongue base retraction;Pharyngeal residue - valleculae;Penetration/Aspiration before swallow Pharyngeal Material enters airway, remains ABOVE vocal cords and not ejected out Pharyngeal- Honey Cup -- Pharyngeal -- Pharyngeal- Nectar Teaspoon Delayed swallow initiation-pyriform sinuses;Reduced tongue base retraction;Pharyngeal residue - valleculae;Penetration/Aspiration before swallow Pharyngeal Material enters airway, remains ABOVE vocal cords and not ejected out Pharyngeal- Nectar Cup -- Pharyngeal -- Pharyngeal- Nectar Straw -- Pharyngeal -- Pharyngeal- Thin Teaspoon -- Pharyngeal -- Pharyngeal- Thin Cup -- Pharyngeal -- Pharyngeal- Thin Straw -- Pharyngeal -- Pharyngeal- Puree Delayed swallow initiation-vallecula;Reduced tongue base retraction;Pharyngeal residue - valleculae Pharyngeal -- Pharyngeal- Mechanical Soft -- Pharyngeal -- Pharyngeal- Regular -- Pharyngeal -- Pharyngeal- Multi-consistency -- Pharyngeal -- Pharyngeal- Pill -- Pharyngeal -- Pharyngeal Comment --  CHL IP CERVICAL ESOPHAGEAL PHASE 01-29-16 Cervical Esophageal Phase WFL Pudding Teaspoon -- Pudding Cup -- Honey Teaspoon -- Honey Cup -- Nectar Teaspoon -- Nectar Cup -- Nectar Straw -- Thin Teaspoon -- Thin Cup -- Thin Straw -- Puree -- Mechanical Soft -- Regular -- Multi-consistency -- Pill -- Cervical Esophageal Comment -- No flowsheet data found. Maxcine Ham 01/29/2016, 3:07 PM  Maxcine Ham, M.A. CCC-SLP [phone]             Ct Head Code Stroke W/o Cm  Result Date: 01-29-2016 CLINICAL DATA:  Code stroke. LEFT arm weakness, facial droop. History of hypertension and atrial fibrillation. EXAM: CT HEAD WITHOUT CONTRAST CT CERVICAL SPINE WITHOUT CONTRAST TECHNIQUE: Multidetector CT imaging of the head and cervical spine was performed following the standard  protocol without intravenous contrast. Multiplanar CT image reconstructions of the cervical spine were also generated. COMPARISON:  None. FINDINGS: CT HEAD FINDINGS BRAIN: Blurring of the RIGHT frontal lobe and posterior insula gray-white matter differentiation. The ventricles and sulci are normal for age. No intraparenchymal hemorrhage, mass effect nor midline shift. Patchy supratentorial white matter hypodensities within normal range for patient's age, though non-specific are most compatible with chronic small vessel ischemic disease. No abnormal extra-axial fluid collections. Basal cisterns are patent. VASCULAR: Dense RIGHT middle cerebral artery. Mild calcific atherosclerosis the carotid siphons. SKULL: No skull fracture. No significant scalp soft tissue swelling. SINUSES/ORBITS: Mild paranasal sinus mucosal thickening. Mastoid air cells are well aerated.The included ocular globes and orbital contents are non-suspicious. OTHER: None. ASPECTS [facility] Stroke Program Early CT Score) - Ganglionic level infarction (caudate, lentiform nuclei, internal capsule, insula, M1-M3 cortex): 5 - Supraganglionic infarction (M4-M6 cortex): 2 Total score (0-10 with 10 being normal): 7 CT CERVICAL SPINE FINDINGS ALIGNMENT: Maintained lordosis. Minimal grade 1 C5-6 anterolisthesis. Mild broad dextroscoliosis could be positional. SKULL BASE AND  VERTEBRAE: Cervical vertebral bodies and posterior elements are intact. Intervertebral disc heights preserved. Multilevel severe LEFT facet arthropathy. No destructive bony lesions. C1-2 articulation maintained. SOFT TISSUES AND SPINAL CANAL: Nonacute. Mild calcific atherosclerosis of the carotid bulbs. DISC LEVELS: No significant osseous canal stenosis. Severe LEFT C3-4 through C5-6 neural foraminal narrowing. UPPER CHEST: Fibronodular scarring RIGHT lung apex with calcified granuloma. OTHER: None. IMPRESSION: 1. Acute multifocal small to moderate MCA territory infarct, no hemorrhage. Dense  RIGHT middle cerebral artery consistent with thromboembolism. 2. ASPECTS is 7. 3. No acute cervical spine fracture or malalignment. Severe LEFT C3-4 through C5-6 neural foraminal narrowing. Critical Value/emergent results were called by telephone at the time of interpretation on 02-03-16 at 4:00 am to Dr. Otelia Limes, NeurologyADELEKE ONI , who verbally acknowledged these results. Electronically Signed   By: Awilda Metro M.D.   On: 02/03/2016 04:10    Assessment/Plan: Diagnosis: acute right MCA territory infarct Labs and images independently reviewed.  Records reviewed and summated above. Stroke: Continue secondary stroke prophylaxis and Risk Factor Modification listed below:   Antiplatelet therapy:   Blood Pressure Management:  Continue current medication with prn's with permisive HTN per primary team Statin Agent:   ?hemiparesis: fit for orthosis to prevent contractures (resting hand splint for day, wrist cock up splint at night, PRAFO, etc) Motor recovery: Fluoxetine  1. Does the need for close, 24 hr/day medical supervision in concert with the patient's rehab needs make it unreasonable for this patient to be served in a less intensive setting? Yes   2. Co-Morbidities requiring supervision/potential complications: HTN (monitor and provide prns in accordance with increased physical exertion and pain), hyperlipidemia, PAF (monitor HR with increased mobility), tachypnea (monitor RR and O2 Sats with increased physical exertion), hypothyroidism (ensure mood and energy do not limit therapies), nonverbal and difficult to arouse (neuro checks for worsening of condition) 3. Due to bladder management, bowel management, safety, skin/wound care, disease management, medication administration, pain management and patient education, does the patient require 24 hr/day rehab nursing? Yes 4. Does the patient require coordinated care of a physician, rehab nurse, PT (1-2 hrs/day, 5 days/week), OT (1-2 hrs/day, 5  days/week) and SLP (1-2 hrs/day, 5 days/week) to address physical and functional deficits in the context of the above medical diagnosis(es)? Yes Addressing deficits in the following areas: balance, endurance, locomotion, strength, transferring, bowel/bladder control, bathing, dressing, feeding, grooming, toileting, cognition, speech, language, swallowing and psychosocial support 5. Can the patient actively participate in an intensive therapy program of at least 3 hrs of therapy per day at least 5 days per week? No 6. The potential for patient to make measurable gains while on inpatient rehab is Poor at present 7. Anticipated functional outcomes upon discharge from inpatient rehab are Min A/Supervision if more alert  with PT, Min A/Supervision if more alert with OT, Supervision/Mod I if more alert with SLP. 8. Estimated rehab length of stay to reach the above functional goals is: 25-30 days. 9. Does the patient have adequate social supports and living environment to accommodate these discharge functional goals? Potentially 10. Anticipated D/C setting: SNF 11. Anticipated post D/C treatments: SNF 12. Overall Rehab/Functional Prognosis: fair  RECOMMENDATIONS: This patient's condition is appropriate for continued rehabilitative care in the following setting: Pt acutely ill with signficant somnolence and labored breathing at present.  Currently he is unable to tolerate therapies and not medically stable.  If medical condition improves, recommend CIR, however, if current situation persists recommend SNF vs. Palliative consult.  Patient has agreed to  participate in recommended program. Potentially Note that insurance prior authorization may be required for reimbursement for recommended care.  Comment: Rehab Admissions Coordinator to follow up.  Charlton Amor., PA-C 01/14/2016  Maryla Morrow, MD, [place]

## 2016-01-14 NOTE — Progress Notes (Signed)
  Echocardiogram 2D Echocardiogram has been performed.  Jerry [name] 01/14/2016, 11:51 AM

## 2016-01-15 ENCOUNTER — Inpatient Hospital Stay (HOSPITAL_COMMUNITY): Payer: Medicare Other

## 2016-01-15 DIAGNOSIS — J9601 Acute respiratory failure with hypoxia: Secondary | ICD-10-CM

## 2016-01-15 DIAGNOSIS — J690 Pneumonitis due to inhalation of food and vomit: Secondary | ICD-10-CM

## 2016-01-15 LAB — GLUCOSE, CAPILLARY
GLUCOSE-CAPILLARY: 153 mg/dL — AB (ref 65–99)
GLUCOSE-CAPILLARY: 154 mg/dL — AB (ref 65–99)
GLUCOSE-CAPILLARY: 166 mg/dL — AB (ref 65–99)
GLUCOSE-CAPILLARY: 172 mg/dL — AB (ref 65–99)
GLUCOSE-CAPILLARY: 173 mg/dL — AB (ref 65–99)
GLUCOSE-CAPILLARY: 175 mg/dL — AB (ref 65–99)
GLUCOSE-CAPILLARY: 177 mg/dL — AB (ref 65–99)
Glucose-Capillary: 153 mg/dL — ABNORMAL HIGH (ref 65–99)
Glucose-Capillary: 158 mg/dL — ABNORMAL HIGH (ref 65–99)
Glucose-Capillary: 146 mg/dL — ABNORMAL HIGH (ref 65–99)

## 2016-01-15 LAB — PHOSPHORUS
Phosphorus: 3 mg/dL (ref 2.5–4.6)
Phosphorus: 2.8 mg/dL (ref 2.5–4.6)

## 2016-01-15 LAB — BLOOD GAS, ARTERIAL
ACID-BASE EXCESS: 0.6 mmol/L (ref 0.0–2.0)
Bicarbonate: 24.5 mmol/L (ref 20.0–28.0)
Delivery systems: POSITIVE
Drawn by: 270271
EXPIRATORY PAP: 6
FIO2: 50
INSPIRATORY PAP: 14
MODE: POSITIVE
O2 SAT: 98.2 %
PATIENT TEMPERATURE: 100.3
PCO2 ART: 39.3 mmHg (ref 32.0–48.0)
PH ART: 7.416 (ref 7.350–7.450)
PO2 ART: 125 mmHg — AB (ref 83.0–108.0)

## 2016-01-15 LAB — INFLUENZA PANEL BY PCR (TYPE A & B)
Influenza A By PCR: NEGATIVE
Influenza B By PCR: NEGATIVE

## 2016-01-15 LAB — MAGNESIUM
Magnesium: 2.2 mg/dL (ref 1.7–2.4)
Magnesium: 2.4 mg/dL (ref 1.7–2.4)

## 2016-01-15 MED ORDER — MORPHINE SULFATE (PF) 2 MG/ML IV SOLN
2.0000 mg | INTRAVENOUS | Status: DC | PRN
  Administered 2016-01-18: 4 mg via INTRAVENOUS
  Administered 2016-01-18: 2 mg via INTRAVENOUS
  Administered 2016-01-18: 4 mg via INTRAVENOUS
  Administered 2016-01-18: 2 mg via INTRAVENOUS
  Administered 2016-01-18: 4 mg via INTRAVENOUS
  Administered 2016-01-19 (×3): 2 mg via INTRAVENOUS
  Administered 2016-01-19: 4 mg via INTRAVENOUS
  Administered 2016-01-20 – 2016-01-27 (×11): 2 mg via INTRAVENOUS
  Administered 2016-01-28: 4 mg via INTRAVENOUS
  Filled 2016-01-15: qty 1
  Filled 2016-01-15: qty 2
  Filled 2016-01-15 (×7): qty 1
  Filled 2016-01-15: qty 2
  Filled 2016-01-15: qty 1
  Filled 2016-01-15: qty 2
  Filled 2016-01-15: qty 1
  Filled 2016-01-15: qty 2
  Filled 2016-01-15: qty 1
  Filled 2016-01-15: qty 2
  Filled 2016-01-15 (×2): qty 1
  Filled 2016-01-15 (×2): qty 2
  Filled 2016-01-15 (×2): qty 1

## 2016-01-15 MED ORDER — DEXTROSE 5 % IV SOLN
1.0000 g | INTRAVENOUS | Status: DC
  Filled 2016-01-15: qty 10

## 2016-01-15 MED ORDER — AZITHROMYCIN 500 MG IV SOLR
500.0000 mg | INTRAVENOUS | Status: DC
  Filled 2016-01-15: qty 500

## 2016-01-15 MED ORDER — LEVALBUTEROL HCL 1.25 MG/0.5ML IN NEBU
1.2500 mg | INHALATION_SOLUTION | Freq: Once | RESPIRATORY_TRACT | Status: AC
  Administered 2016-01-15: 1.25 mg via RESPIRATORY_TRACT
  Filled 2016-01-15: qty 0.5

## 2016-01-15 MED ORDER — VITAL HIGH PROTEIN PO LIQD: 1000.0000 mL | ORAL | Status: DC

## 2016-01-15 MED ORDER — VITAL AF 1.2 CAL PO LIQD
1000.0000 mL | ORAL | Status: DC
  Administered 2016-01-15 – 2016-01-17 (×4): 1000 mL
  Filled 2016-01-15 (×7): qty 1000

## 2016-01-15 MED ORDER — SODIUM CHLORIDE 0.9 % IV SOLN
3.0000 g | Freq: Four times a day (QID) | INTRAVENOUS | Status: DC
  Administered 2016-01-15 – 2016-01-18 (×13): 3 g via INTRAVENOUS
  Filled 2016-01-15 (×14): qty 3

## 2016-01-15 MED ORDER — ASPIRIN 325 MG PO TABS
325.0000 mg | ORAL_TABLET | Freq: Every day | ORAL | Status: DC
  Administered 2016-01-15 – 2016-01-22 (×4): 325 mg
  Filled 2016-01-15 (×4): qty 1

## 2016-01-15 NOTE — Progress Notes (Signed)
Initial Nutrition Assessment  INTERVENTION:   Vital AF 1.2 @ 70 ml/hr (1680 ml/day) Provides: 2016 kcal, 126 grams protein, and 1362 ml H2O.   NUTRITION DIAGNOSIS:   Inadequate oral intake related to inability to eat as evidenced by NPO status.  GOAL:   Patient will meet greater than or equal to 90% of their needs  MONITOR:   TF tolerance, Labs, I & O's  REASON FOR ASSESSMENT:   Consult Enteral/tube feeding initiation and management  ASSESSMENT:   Pt with PMH of chronic afib on Xarelto admitted with acute R MCA stroke.    Wife at bedside.  1/3 Cortrak tube, tip in gastric pyloric region This writer noticed that Cortrak tube had been pulled out some. Repositioned.  CBG's: 166-153 Nutrition-Focused physical exam completed. Findings are no fat depletion, no muscle depletion, and no edema.   Diet Order:  Diet NPO time specified  Skin:  Reviewed, no issues  Last BM:  1/1  Height:   Ht Readings from Last 1 Encounters:  2016-02-25 6\' 1"  (1.854 m)    Weight:   Wt Readings from Last 1 Encounters:  2016-02-25 191 lb 12.8 oz (87 kg)    Ideal Body Weight:  83.6 kg  BMI:  Body mass index is 25.3 kg/m.  Estimated Nutritional Needs:   Kcal:  2000-2200  Protein:  105-120 grams  Fluid:  > 2 L/day  EDUCATION NEEDS:   No education needs identified at this time  Kendell BaneHeather Banessa Mao RD, LDN, CNSC [phone] Pager [phone] After Hours Pager

## 2016-01-15 NOTE — Progress Notes (Signed)
STROKE TEAM PROGRESS NOTE   SUBJECTIVE (INTERVAL HISTORY) No family is at bedside. Pt transferred to ICU with intent to intubate for respiratory distress. However, wife made pt DNR. He was kept on BiPAP overnight, tolerating well. Currently, off BiPAP on [place] and so far tolerating but still has mild to moderate respiratory distress and improved from yesterday. Pt sleepy but arousable and following all the commands.  OBJECTIVE Temp:  [98.3 F (36.8 C)-100.7 F (38.2 C)] 99.9 F (37.7 C) (01/04 1200) Pulse Rate:  [64-198] 85 (01/04 1500) Cardiac Rhythm: Atrial fibrillation (01/04 0800) Resp:  [23-35] 31 (01/04 1500) BP: (144-198)/(70-118) 176/80 (01/04 1500) SpO2:  [87 %-100 %] 96 % (01/04 1500)  CBC:   Recent Labs Lab 23-Apr-2016 0350 23-Apr-2016 0356  WBC 8.8  --   NEUTROABS 5.1  --   HGB 14.4 14.3  HCT 42.4 42.0  MCV 97.7  --   PLT 216  --     Basic Metabolic Panel:   Recent Labs Lab 23-Apr-2016 0350 23-Apr-2016 0356 01/14/16 0850 01/15/16 1405  NA 138 140 138  --   K 3.9 3.8 4.1  --   CL 103 104 106  --   CO2 26  --  22  --   GLUCOSE 155* 151* 172*  --   BUN 9 12 13   --   CREATININE 0.92 0.90 0.98  --   CALCIUM 9.5  --  9.0  --   MG  --   --  1.9 2.2  PHOS  --   --   --  3.0    Lipid Panel:     Component Value Date/Time   CHOL 139 2016/04/02 0730   TRIG 94 2016/04/02 0730   HDL 51 2016/04/02 0730   CHOLHDL 2.7 2016/04/02 0730   VLDL 19 2016/04/02 0730   LDLCALC 69 2016/04/02 0730   HgbA1c:  Lab Results  Component Value Date   HGBA1C 6.6 (H) 2016/04/02   Urine Drug Screen: No results found for: LABOPIA, COCAINSCRNUR, LABBENZ, AMPHETMU, THCU, LABBARB    IMAGING I have personally reviewed the radiological images below and agree with the radiology interpretations.  Ct Head Code Stroke W/o Cm 01/27/2016 1. Acute multifocal small to moderate MCA territory infarct, no hemorrhage. Dense RIGHT middle cerebral artery consistent with thromboembolism. 2. ASPECTS is 7.    Ct Angio Head W Or Wo Contrast 01/27/2016 Emergent RIGHT M1 large vessel occlusion. Moderate stenosis RIGHT P3 segment.   Ct Angio Neck W And/or Wo Contrast 01/24/2016 Atherosclerosis without hemodynamically significant stenosis or acute vascular process.   Ct Cervical Spine Wo Contrast 02/05/2016 No acute cervical spine fracture or malalignment. Severe LEFT C3-4 through C5-6 neural foraminal narrowing.   Ct Cerebral Perfusion W Contrast 01/25/2016 Acute large RIGHT MCA territory infarct, with additional 72 cc penumbra/brain at risk.   Ct Head Wo Contrast 01/14/2016 IMPRESSION: Developing hypodensity in the right MCA territory compatible with acute infarct. Similar territory to MRI yesterday. Negative for hemorrhage.   Mr Brain Wo Contrast 02/05/2016 IMPRESSION: 1. Large evolving acute right MCA territory infarct as above. Associated petechial hemorrhage without frank hemorrhagic transformation. No significant mass effect at this time. 2. Underlying age-related cerebral atrophy with mild chronic microvascular ischemic disease.    Dg Chest Port 1 View 01/14/2016 IMPRESSION: Mild cardiac enlargement. No edema or consolidation. There is aortic atherosclerosis.   TTE The patient was in atrial fibrillation. Normal LV size with   moderate LV hypertrophy. EF 60-65%. Mildly dilated RV with mildly  decreased systolic function. Moderate pulmonary hypertension.   Mild MR.  Ct Head Wo Contrast 01/15/2016  IMPRESSION: Evolving large RIGHT MCA territory infarct with petechial hemorrhage, no hemorrhagic conversion. 1-2 mm RIGHT to LEFT midline shift with slight RIGHT lateral ventricle effacement, no hydrocephalus or entrapment.   Ct Angio Chest Pe W Or Wo Contrast 01/14/2016 IMPRESSION: No acute large central pulmonary embolus. Cardiomegaly with coronary arteriosclerosis and aortic atherosclerosis. Small bilateral pleural effusions with compressive atelectasis. Right upper lobe granuloma.   Dg Chest  Port 1 View 01/15/2016 IMPRESSION: 1. Interval development of bibasilar right greater than left opacification which may be due to atelectasis or infiltrate 2. Stable cardiomegaly.   PHYSICAL EXAM  Temp:  [98.3 F (36.8 C)-100.7 F (38.2 C)] 99.9 F (37.7 C) (01/04 1200) Pulse Rate:  [64-198] 85 (01/04 1500) Resp:  [23-35] 31 (01/04 1500) BP: (144-198)/(70-118) 176/80 (01/04 1500) SpO2:  [87 %-100 %] 96 % (01/04 1500)  General - Well nourished, well developed, in moderate respiratory distress with [place].  Ophthalmologic - Fundi not visualized due to noncooperation.  Cardiovascular - irregularly irregular heart rate and rhythm.  Neuro - sleepy and eyes open on voice, not able to answer orientation questions. However, able to follow peripheral and midline commands. Eyes deviated to the right, not able to cross over midline. PERRL. Left facial droop. Severe dysarthria. Spontaneous movement at RUE and RLE, 3+/5 LUE and 3-/5 LLE. Bulk was normal and fasciculations were absent. Muscle tone was normal. DTR 1+ in all extremities and he had no pathological reflexes. Sensation, coordination and gait not tested due to respiratory distress and noncooperation   ASSESSMENT/PLAN Mr. SUPREME RYBARCZYK is a 83 y.o. male with history of AF on xarelto, HTN, HLD, hypothyroidism and gout presenting with L side weakness and facial droop. He did not receive IV t-PA.   Stroke:  Non-dominant right MCA large infarct embolic most likely source secondary to known atrial fibrillation   Resultant  left hemiparesis and left neglect  Code Stroke CT  R MCA territory infarcts. Dense R MCA  CTA head  Emergent R M1 large vessel occlusion, moderate R P3 stenosis  CTA neck  Atherosclerosis w/o hemodynamic stenosis  CTP  Large R MCA core infarct, and small penumbra   MRI  Large R MCA infarct but no midline shift  Repeat CT - evolving right MCA infarct and no hemorrhage  2D Echo  EF 60-65%   LDL 69  HgbA1c  6.6  lovenox for VTE prophylaxis  Xarelto (rivaroxaban) daily prior to admission, now on aspirin 300 mg suppository daily due to large infarct. Consider to switch to eliquis in one week.   Patient counseled to be compliant with his antithrombotic medications  Ongoing aggressive stroke risk factor management  Therapy recommendations:  pending   Disposition:  pending   Respiratory distress  CXR right greater than left opacification, concerning for aspiration  On Unasyn  ABG improved on BiPAP  CTA chest ruled out PE  No on [place], tolerating  Atrial Fibrillation  Home anticoagulation:  Xarelto (rivaroxaban) daily   Failed Xarelto, now on aspirin 300 supp given large infarct  Consider to swtich to eliquis in on week  Rate controlled now  on metoprolol 25mg  bid  Resume digoxin    Hypertension  Stable Permissive hypertension (OK if < 220/120) but gradually normalize in 5-7 days  On metoprolol Long-term BP goal normotensive  Hyperlipidemia  Home meds:  lipitor 40 and lovaza  Resumed lipitor 40  LDL 69,  goal < 70  Continue statin at discharge  Other Stroke Risk Factors  Advanced age  Family hx stroke (father)  Other Active Problems  Hypothyroidism - on synthroid  Hospital day # 2  This patient is critically ill due to large right MCA infarct, afib with RVR, significant respiratory distress, dysphagia and at significant risk of neurological worsening, death form recurrent stroke, heart failure, hemorrhagic transformation, aspiration pneumonia, PE and respiratory failure. This patient's care requires constant monitoring of vital signs, hemodynamics, respiratory and cardiac monitoring, review of multiple databases, neurological assessment, discussion with family, other specialists and medical decision making of high complexity. I spent 35 minutes of neurocritical care time in the care of this patient.   Marvel Plan, MD PhD Stroke Neurology 01/15/2016 3:28  PM   To contact Stroke Continuity provider, please refer to [email or web address]. After hours, contact General Neurology

## 2016-01-15 NOTE — Progress Notes (Signed)
SLP Cancellation Note  Patient Details Name: Jerry [name] MRN: [id number] DOB: 09-Aug-1931   Cancelled treatment:         Pt currently on Bipap. Unsure of plan for pt, spoke with nursing. Will defer swallow session for now. Asked RN to call [phone] if pt to be seen later today (this SLP will be leaving at noon).   Royce MacadamiaLitaker, Earl Losee Willis 01/15/2016, 8:02 AM

## 2016-01-15 NOTE — Progress Notes (Signed)
Pt taken off bipap and placed on 4L [place] at this time. RN and RT will continue to monitor for possible need of going back on bipap.

## 2016-01-15 NOTE — Progress Notes (Signed)
Pharmacy Antibiotic Note  Jerry [name] is a 83 y.o. male admitted on 01/29/2016, now w/ concern for aspiration.  Pharmacy has been consulted for Unasyn dosing.  Plan: Unasyn 3g IV Q6H.  Height: 6\' 1"  (185.4 cm) Weight: 191 lb 12.8 oz (87 kg) IBW/kg (Calculated) : 79.9  Temp (24hrs), Avg:99.1 F (37.3 C), Min:97.6 F (36.4 C), Max:100.7 F (38.2 C)   Recent Labs Lab 02/11/2016 0350 02/08/2016 0356 01/14/16 0850  WBC 8.8  --   --   CREATININE 0.92 0.90 0.98    Estimated Creatinine Clearance: 65.7 mL/min (by C-G formula based on SCr of 0.98 mg/dL).    Allergies  Allergen Reactions  . Cardizem [Diltiazem Hcl] Rash     Thank you for allowing pharmacy to be a part of this patient's care.  Vernard GamblesVeronda Keithan Dileonardo, PharmD, BCPS  01/15/2016 3:11 AM

## 2016-01-15 NOTE — Progress Notes (Signed)
Transported pt to CT on the Bipap.. No events to report.. Pt is now back in 3S room 2.

## 2016-01-15 NOTE — Progress Notes (Signed)
Shift event note:  Notified by RN regarding concern for pt's increased WOB and increased 02 demand. 02 sats now in the low 80's on 6L [place]. RRT called to bedside and placed pt on BiPAP.  RRT was able to suction a great deal of upper airway secretions. RN also reports change in neuro exam. States that though pt has been non-verbal today at the 2000 neuro exam pt was still responding with eye opening and was following simple commands. Stat PXCR ordered. At bedside pt noted in NAD. He grimaces only slightly to deep sternal rub, does not open eyes at all, and there is not verbal response. He will no longer follow commands. GCS 6. 02 sats currently 96% on BiPAP w/ RR in the 20-30's. BBS diminished w/ scattered expiratory wheezes. Very weak cough effort.  Assessment/Plan: 1. Decreased LOC: Definite decline in neurological status. Suspect progression of large (R) MCA infarct. Discussed pt w/ Dr Otelia LimesLindzen w/ neurology service who has requested a stat ct head w/o cm.  2. Tachypnea: With increased 02 demand. Tolerating Bipap for now however concerned that decreased LOC will necessitate need for intubation. PCXR reveals development of bibasilar R>L opacifications c/w atelectasis vs infiltrate. Given low grade fevers today will add IV Zithromax and Rocephin. Discussed pt w/ Dr Arsenio LoaderSommer w/ [facility] who has agreed to have [facility] for likely intubation.  Discussed change in pt's status w/ wife "Jerry [name]". She appears very over whelmed by his change in status as she was thinking her husband was progressing towards getting better. She is having difficulty processing all of the information and decisions that she may need to make. After much discussion she agrees that she would like everything done including CPR and intubation if it would give him a chance of meaningful survival. P. Mikey BussingHoffman, NP w/ PCCM updated at bedside. He further clarified options for the wife and ultimately the decision was made to transport to ct on  BiPAP and then decide on further escalation of care when more information is available. Pt will be transferred to ICU per request of PCCM after ct. Appreciate PCCM input and management.   Jerry ChangKatherine P. Lluvia Gwynne, NP-C [facility] Pager [phone]

## 2016-01-15 NOTE — Progress Notes (Signed)
PULMONARY / CRITICAL CARE MEDICINE   Name: Jerry FewHarold D Debono MRN: [id number] DOB: Jul 29, 1931    ADMISSION DATE:  01/17/2016 CONSULTATION DATE:  1/3  REFERRING MD:  Dr. Cena BentonVega  CHIEF COMPLAINT:  AMS  HISTORY OF PRESENT ILLNESS:   83 year old male with PMH as below, which is significant for atrial fibrillation who was admitted with large R MCA CVA on 1/2. His wife noted him to have L sided weakness and R gaze deviation in the early AM hours. He was brought to the ED as a code stroke and on CT he was found to have large right sided acute MCA infarct. Neurologist felt as though he was not a candidate for tpa or endovascular procedure. 1/3 he had a progressive worsening of mental status throughout the day and worsening dyspnea. He eventually became unresponsive and PCCM was asked to see for further evaluation.   SUBJECTIVE:  No acute change.  Appears comfortable on bipap. No family at bedside.  Now DNR.   VITAL SIGNS: BP (!) 178/89   Pulse 84   Temp 99.7 F (37.6 C) (Axillary)   Resp (!) 30   Ht 6\' 1"  (1.854 m)   Wt 87 kg (191 lb 12.8 oz)   SpO2 92%   BMI 25.30 kg/m    HEMODYNAMICS:     INTAKE / OUTPUT: I/O last 3 completed shifts: In: 1640 [I.V.:1500; NG/GT:40; IV Piggyback:100] Out: 1175 [Urine:1175]  PHYSICAL EXAMINATION: General:  Elderly male in NAD on bipap Neuro:  Obtunded HEENT:  [place]/AR, PERRL Cardiovascular:  IRIR, rate 70s. No MRG Lungs:  resps non labored on bipap, Coarse bilateral Abdomen:  Soft, non-distended Musculoskeletal:  No acute deformity Skin:  Grossly intact  LABS:  BMET  Recent Labs Lab 2016-02-26 0350 2016-02-26 0356 01/14/16 0850  NA 138 140 138  K 3.9 3.8 4.1  CL 103 104 106  CO2 26  --  22  BUN 9 12 13   CREATININE 0.92 0.90 0.98  GLUCOSE 155* 151* 172*    Electrolytes  Recent Labs Lab 2016-02-26 0350 01/14/16 0850  CALCIUM 9.5 9.0  MG  --  1.9    CBC  Recent Labs Lab 2016-02-26 0350 2016-02-26 0356  WBC 8.8  --   HGB 14.4 14.3   HCT 42.4 42.0  PLT 216  --     Coag's  Recent Labs Lab 2016-02-26 0350  APTT 39*  INR 1.89    Sepsis Markers No results for input(s): LATICACIDVEN, PROCALCITON, O2SATVEN in the last 168 hours.  ABG  Recent Labs Lab 01/14/16 1350 01/15/16 0207  PHART 7.483* 7.416  PCO2ART 30.6* 39.3  PO2ART 64.3* 125*    Liver Enzymes  Recent Labs Lab 2016-02-26 0350 01/14/16 0850  AST 34 34  ALT 34 28  ALKPHOS 77 58  BILITOT 1.5* 3.0*  ALBUMIN 4.1 3.6    Cardiac Enzymes  Recent Labs Lab 2016-02-26 0730  TROPONINI <0.03    Glucose  Recent Labs Lab 01/14/16 1116 01/14/16 1622 01/14/16 1927 01/14/16 2315 01/15/16 0344 01/15/16 0800  GLUCAP 153* 173* 158* 172* 166* 153*    Imaging Ct Head Wo Contrast  Result Date: 01/15/2016 CLINICAL DATA:  Altered mental status, follow-up stroke. History of atrial fibrillation, hypertension, dyslipidemia. EXAM: CT HEAD WITHOUT CONTRAST TECHNIQUE: Contiguous axial images were obtained from the base of the skull through the vertex without intravenous contrast. COMPARISON:  CT HEAD January 13, 2006 and priors, MRI of the head January 12, 2006 FINDINGS: BRAIN: Confluent RIGHT frontotemporal parietal hypodensity,  patchy hypodensity RIGHT basal ganglia. Minimal petechial hemorrhage without lobar hematoma. Local sulcal effacement. 1-2 mm LEFT-to-RIGHT midline shift results in slight effacement of RIGHT lateral ventricle, no hydrocephalus or entrapment. No new acute large vascular territory infarcts. Moderate white matter changes exclusive of the aforementioned abnormality compatible with chronic small vessel ischemic disease. No abnormal extra-axial fluid collections. Basal cisterns are patent. VASCULAR: Dense RIGHT carotid terminus to MCA compatible with thromboembolism. Mild calcific atherosclerosis the carotid siphons. SKULL: No skull fracture. No significant scalp soft tissue swelling. SINUSES/ORBITS: The mastoid air-cells and included paranasal  sinuses are well-aerated.The included ocular globes and orbital contents are non-suspicious. OTHER: None. IMPRESSION: Evolving large RIGHT MCA territory infarct with petechial hemorrhage, no hemorrhagic conversion. 1-2 mm RIGHT to LEFT midline shift with slight RIGHT lateral ventricle effacement, no hydrocephalus or entrapment. Electronically Signed   By: Awilda Metro M.D.   On: 01/15/2016 01:54   Ct Head Wo Contrast  Result Date: 01/14/2016 CLINICAL DATA:  Embolic infarction EXAM: CT HEAD WITHOUT CONTRAST TECHNIQUE: Contiguous axial images were obtained from the base of the skull through the vertex without intravenous contrast. COMPARISON:  MRI 02-02-2016 FINDINGS: Brain: Developing hypodensity in the right MCA territory compatible with acute infarct. Similar territory to diffusion-weighted imaging yesterday. This involves the temporal lobe, insula, and portions of the right frontal parietal lobe. No associated hemorrhage. No shift of the midline structures. Ventricle size normal.  Negative for intracranial hemorrhage. Vascular: Hyperdense right MCA compatible with thrombus, less prominent on today's study compared with yesterday. Skull: Negative Sinuses/Orbits: Mild mucosal edema in the paranasal sinuses Other: None IMPRESSION: Developing hypodensity in the right MCA territory compatible with acute infarct. Similar territory to MRI yesterday. Negative for hemorrhage. Electronically Signed   By: Marlan Palau M.D.   On: 01/14/2016 10:16   Ct Angio Chest Pe W Or Wo Contrast  Result Date: 01/14/2016 CLINICAL DATA:  Tachypnea EXAM: CT ANGIOGRAPHY CHEST WITH CONTRAST TECHNIQUE: Multidetector CT imaging of the chest was performed using the standard protocol during bolus administration of intravenous contrast. Multiplanar CT image reconstructions and MIPs were obtained to evaluate the vascular anatomy. CONTRAST:  100 cc Isovue 370 IV COMPARISON:  CXR 01/14/2016, CT AP 08/27/2011 FINDINGS: Cardiovascular:  Cardiomegaly with coronary arteriosclerosis. No significant pericardial effusion or thickening. No aortic aneurysm noted. Aortic atherosclerosis. No large central pulmonary embolus to the third order branches allowing for slight respiratory motion artifacts especially to the lower lobes. What appear to be tiny filling defects in the peripheral pulmonary arterial system are believed to be branch points for pulmonary vessels. Mediastinum/Nodes: Unremarkable thyroid. Trachea is patent. Esophagus contains a gastric tube which terminates in the distal stomach. No mediastinal nor hilar lymphadenopathy. Lungs/Pleura: Small bilateral layering pleural effusions with adjacent atelectasis. No pneumonic consolidation or dominant mass. No pneumothorax. Calcified granuloma in the right upper lobe. Upper Abdomen: Nonacute. Subtle hyperdensity along the dependent aspect of the included gallbladder may represent partial visualization of previously noted gallstones. Musculoskeletal: Multilevel degenerative disc space narrowing and osteophyte formation consistent with thoracolumbar spondylosis. Sternoclavicular joint osteoarthritis. No acute osseous abnormality. Review of the MIP images confirms the above findings. IMPRESSION: No acute large central pulmonary embolus. Cardiomegaly with coronary arteriosclerosis and aortic atherosclerosis. Small bilateral pleural effusions with compressive atelectasis. Right upper lobe granuloma. Cholelithiasis. Electronically Signed   By: Tollie Eth M.D.   On: 01/14/2016 17:24   Dg Chest Port 1 View  Result Date: 01/15/2016 CLINICAL DATA:  Shortness of breath EXAM: PORTABLE CHEST 1 VIEW COMPARISON:  01/14/2016  FINDINGS: Interim placement of esophageal tube, this extends below the diaphragm but is not included on the image. There is stable cardiomegaly. There is interval increase in bibasilar right greater than left opacities which may reflect atelectasis, pneumonia or aspiration. Atherosclerosis  of the arch. No pneumothorax. IMPRESSION: 1. Interval development of bibasilar right greater than left opacification which may be due to atelectasis or infiltrate 2. Stable cardiomegaly Electronically Signed   By: Jasmine Pang M.D.   On: 01/15/2016 00:03   Dg Chest Port 1 View  Result Date: 01/14/2016 CLINICAL DATA:  Respiratory distress EXAM: PORTABLE CHEST 1 VIEW COMPARISON:  None. FINDINGS: There is no edema or consolidation. Heart is mildly enlarged with pulmonary vascularity within normal limits. No adenopathy. There is atherosclerotic calcification in the aorta. No evident bone lesions. IMPRESSION: Mild cardiac enlargement. No edema or consolidation. There is aortic atherosclerosis. Electronically Signed   By: Bretta Bang III M.D.   On: 01/14/2016 14:15   Dg Abd Portable 1v  Result Date: 01/14/2016 CLINICAL DATA:  Evaluate feeding tube. EXAM: PORTABLE ABDOMEN - 1 VIEW COMPARISON:  Abdominal CT 08/27/2011 FINDINGS: Feeding tube tip in the gastric pyloric region. Nonobstructive bowel gas pattern with oral contrast seen throughout the colon. IMPRESSION: Feeding tube tip at the gastric pylorus. Electronically Signed   By: Marnee Spring M.D.   On: 01/14/2016 15:43     STUDIES:  CT head 1/2 > Acute multifocal small to moderate MCA territory infarct, no hemorrhage. Dense RIGHT middle cerebral artery consistent with thromboembolism. MR brain 1/2 > Large evolving acute right MCA territory infarct as above. Associated petechial hemorrhage without frank hemorrhagic transformation. No significant mass effect at this time. CT head 1/3 > Developing hypodensity in the right MCA territory compatible with acute infarct. Similar territory to MRI yesterday. Negative for hemorrhage. CT head 1/4 > Evolving large RIGHT MCA territory infarct with petechial hemorrhage, no hemorrhagic conversion. 1-2 mm RIGHT to LEFT midline shift with slight RIGHT lateral ventricle effacement, no hydrocephalus or  entrapment.  CULTURES: RVP 1/4 >  ANTIBIOTICS: Unasyn 1/4 >>>  SIGNIFICANT EVENTS: 1/2 admit for stroke  LINES/TUBES:   DISCUSSION: 83 year old man with chronic atrial fibrillation on Xarelto admitted 1/2 with left-sided weakness, head CT showing right acute proximal MCA infarct, felt beyond window for TPA and not a candidate for endovascular intervention. He had dysphagia and an NG tube was inserted 1/3, he subsequently worsened and became unresponsive with some dyspnea. Moved to ICU and started on bipap. CXR suggests R basilar infiltrate. Now DNR.   ASSESSMENT / PLAN:  NEUROLOGIC A:   Large R MCA distribution CVA Acute encephalopathy following acute R MCA stroke- worsening 1/4 (repeat CT without change, ABG wnl, repeat labs benign)  P:   RASS goal: 0 Per stroke service  PULMONARY A: Acute hypoxemic respiratory failure - multifactorial r/t probable aspiration and declining mental status  Suspect aspiration PNA   P:   BiPAP DNR/DNI CXR and ABG noted Pulmonary hygiene ABX as above  CARDIOVASCULAR A:  Atrial fibrillation (rate controlled)  P:  Telemetry Continue  xarelto, statin, digoxin, metoprolol per tube, and hydralazine PRN   RENAL A:   No acute issues  P:   Follow BMP  GASTROINTESTINAL A:   Dysphagia secondary to stroke  P:   NPO Hold TF for now with aspiration   HEMATOLOGIC A:   Coagulopathy secondary to anticoagulation  P:  Continue eliquis Trend CBC  INFECTIOUS A:   Suspect Aspiration PNA  P:   Start  unasyn BC x 2   ENDOCRINE A:   Hypothyroid  P:   Continue synthroid    FAMILY  - Updates: no family at bedside 1/4.  Per notes son is en route from [place].   - Inter-disciplinary family meet or Palliative Care meeting due by:  1/10  Continue current care.  DNR/DNI.  Will add PRN morphine for dyspnea.  Suspect transition to comfort when son arrives if no improvement.  Will tx to SDU, back to [facility].     Jerry Dress, NP 01/15/2016  9:10 AM Pager: [phone] or [phone]   ATTENDING NOTE / ATTESTATION NOTE :   I have discussed the case with the resident/APP  Jerry Dress NP.   I agree with the resident/APP's  history, physical examination, assessment, and plans.    I have edited the above note and modified it according to our agreed history, physical examination, assessment and plan.   Briefly, 83 year old man with chronic atrial fibrillation on Xarelto admitted 1/2 with left-sided weakness, head CT showing right acute proximal MCA infarct, felt beyond window for TPA and not a candidate for endovascular intervention. He had dysphagia and an NG tube was inserted 1/3, he subsequently worsened and became unresponsive with some dyspnea. Moved to ICU and started on bipap. CXR suggests R basilar infiltrate. Now DNR.  No changes overnight.  BiPaP removed just now and pt's WOB is not worse.  PE as above. 170 systolic, 80 HR, RR 20, 91% on 2L.  Arousable. Follows simple commands. (-) NVD.  Fair ae. Crackles at bases. Good s1/s2. (-) m. (+) BS, soft, NT. Gr 1 edema.   Assessment/Plan : 1. Large R MCA distribution CVA. Per neurology. Keep NPO. Start TF today.   2. Aspiration PNA. Cont unasyn for now.   3. Afib.  Pt with episodes of "sinus pauses" per RN.  Cont Xarelto, statin, digoxin, metoprolol. EKG now with afib. Observe for now. Check lytes.   Pt is DNR. Transfer to SDU today.  TRH will be primary starting 1/5. Dr, Cena Benton to assume care on 1/5 and PCCM will be off then.  I have spent 30 minutes of critical care time with this patient today.  Family :Family updated at length today. Updated wife at bedside.   Pollie Meyer, MD 01/15/2016, 12:43 PM [facility] Pager [phone] After 3 pm or if no answer, call [phone]

## 2016-01-15 NOTE — Progress Notes (Signed)
Patient seen in the ICU. Critical care has evaluated her today. Per my discussion with critical care team their plan is to transition out of ICU based on DO NOT RESUSCITATE/DO NOT INTUBATE. For today and they will manage medical care. Will transition medical management to my team next a.m. As such I will continue to leave patient on my rounding list.  Jerry [name], Pamala HurryLANDO

## 2016-01-15 NOTE — Consult Note (Signed)
PULMONARY / CRITICAL CARE MEDICINE   Name: Jerry [name] MRN: [id number] DOB: [birth year]    ADMISSION DATE:  01/23/2016 CONSULTATION DATE:  1/3  REFERRING MD:  Dr. Cena BentonVega  CHIEF COMPLAINT:  AMS  HISTORY OF PRESENT ILLNESS:   83 year old male with PMH as below, which is significant for atrial fibrillation who was admitted with large R MCA CVA on 1/2. His wife noted him to have L sided weakness and R gaze deviation in the early AM hours. He was brought to the ED as a code stroke and on CT he was found to have large right sided acute MCA infarct. Neurologist felt as though he was not a candidate for tpa or endovascular procedure. 1/3 he had a progressive worsening of mental status throughout the day and worsening dyspnea. He eventually became unresponsive and PCCM was asked to see for further evaluation.  PAST MEDICAL HISTORY :  He  has a past medical history of Dyslipidemia; Gout; HTN (hypertension); Hypothyroidism; and Paroxysmal atrial fibrillation (HCC).  PAST SURGICAL HISTORY: He  has a past surgical history that includes Tonsillectomy and adenoidectomy (AGE 66).  Allergies  Allergen Reactions  . Cardizem [Diltiazem Hcl] Rash    No current facility-administered medications on file prior to encounter.    Current Outpatient Prescriptions on File Prior to Encounter  Medication Sig  . allopurinol (ZYLOPRIM) 300 MG tablet Take 300 mg by mouth daily.    Marland Kitchen. atorvastatin (LIPITOR) 40 MG tablet Take 40 mg by mouth daily.    Marland Kitchen. CALCIUM PO Take 1 tablet by mouth daily.   Marland Kitchen. ibandronate (BONIVA) 150 MG tablet Take 1 tablet by mouth every 30 (thirty) days. TAKE 1 TAB ONCE A MONTH  . levothyroxine (SYNTHROID, LEVOTHROID) 100 MCG tablet Take 100 mcg by mouth daily.    Marland Kitchen. lisinopril-hydrochlorothiazide (PRINZIDE,ZESTORETIC) 20-12.5 MG per tablet Take 1 tablet by mouth daily. TAKE 1 TAB DALY  . Multiple Vitamin (MULTIVITAMIN) tablet Take 1 tablet by mouth daily.    Marland Kitchen. omega-3 acid ethyl esters  (LOVAZA) 1 G capsule Take 2 g by mouth daily.   Carlena Hurl. XARELTO 20 MG TABS tablet Take 20 mg by mouth daily with supper.     FAMILY HISTORY:  His indicated that his mother is deceased. He indicated that his father is deceased. He indicated that his brother is alive. He indicated that his child is deceased.    SOCIAL HISTORY: He  reports that he has never smoked. He has never used smokeless tobacco. He reports that he does not drink alcohol or use drugs.  REVIEW OF SYSTEMS:   unable  SUBJECTIVE:  unable  VITAL SIGNS: BP (!) 167/118 (BP Location: Left Leg)   Pulse 76   Temp 98.3 F (36.8 C) (Axillary)   Resp (!) 33   Ht 6\' 1"  (1.854 m)   Wt 87 kg (191 lb 12.8 oz)   SpO2 98%   BMI 25.30 kg/m   HEMODYNAMICS:    VENTILATOR SETTINGS:    INTAKE / OUTPUT: I/O last 3 completed shifts: In: 1551.3 [I.V.:1551.3] Out: 1050 [Urine:1050]  PHYSICAL EXAMINATION: General:  Elderly male in NAD on bipap Neuro:  Obtunded HEENT:  [place]/AR, PERRL Cardiovascular:  IRIR, rate 70s. No MRG Lungs:  Coarse bilateral. NIMV assisted breaths Abdomen:  Soft, non-distended Musculoskeletal:  No acute deformity Skin:  Grossly intact  LABS:  BMET  Recent Labs Lab 01/17/2016 0350 01/31/2016 0356 01/14/16 0850  NA 138 140 138  K 3.9 3.8 4.1  CL 103  104 106  CO2 26  --  22  BUN 9 12 13   CREATININE 0.92 0.90 0.98  GLUCOSE 155* 151* 172*    Electrolytes  Recent Labs Lab 2016-01-25 0350 01/14/16 0850  CALCIUM 9.5 9.0  MG  --  1.9    CBC  Recent Labs Lab 01-25-2016 0350 01-25-16 0356  WBC 8.8  --   HGB 14.4 14.3  HCT 42.4 42.0  PLT 216  --     Coag's  Recent Labs Lab 01-25-16 0350  APTT 39*  INR 1.89    Sepsis Markers No results for input(s): LATICACIDVEN, PROCALCITON, O2SATVEN in the last 168 hours.  ABG  Recent Labs Lab 01/14/16 1350 01/15/16 0207  PHART 7.483* 7.416  PCO2ART 30.6* 39.3  PO2ART 64.3* 125*    Liver Enzymes  Recent Labs Lab 25-Jan-2016 0350  01/14/16 0850  AST 34 34  ALT 34 28  ALKPHOS 77 58  BILITOT 1.5* 3.0*  ALBUMIN 4.1 3.6    Cardiac Enzymes  Recent Labs Lab 2016-01-25 0730  TROPONINI <0.03    Glucose  Recent Labs Lab 01/25/16 0348 25-Jan-2016 1435 01/25/16 2318 01/14/16 0404 01/14/16 0735  GLUCAP 143* 144* 138* 172* 161*    Imaging Ct Head Wo Contrast  Result Date: 01/15/2016 CLINICAL DATA:  Altered mental status, follow-up stroke. History of atrial fibrillation, hypertension, dyslipidemia. EXAM: CT HEAD WITHOUT CONTRAST TECHNIQUE: Contiguous axial images were obtained from the base of the skull through the vertex without intravenous contrast. COMPARISON:  CT HEAD January 13, 2006 and priors, MRI of the head January 12, 2006 FINDINGS: BRAIN: Confluent RIGHT frontotemporal parietal hypodensity, patchy hypodensity RIGHT basal ganglia. Minimal petechial hemorrhage without lobar hematoma. Local sulcal effacement. 1-2 mm LEFT-to-RIGHT midline shift results in slight effacement of RIGHT lateral ventricle, no hydrocephalus or entrapment. No new acute large vascular territory infarcts. Moderate white matter changes exclusive of the aforementioned abnormality compatible with chronic small vessel ischemic disease. No abnormal extra-axial fluid collections. Basal cisterns are patent. VASCULAR: Dense RIGHT carotid terminus to MCA compatible with thromboembolism. Mild calcific atherosclerosis the carotid siphons. SKULL: No skull fracture. No significant scalp soft tissue swelling. SINUSES/ORBITS: The mastoid air-cells and included paranasal sinuses are well-aerated.The included ocular globes and orbital contents are non-suspicious. OTHER: None. IMPRESSION: Evolving large RIGHT MCA territory infarct with petechial hemorrhage, no hemorrhagic conversion. 1-2 mm RIGHT to LEFT midline shift with slight RIGHT lateral ventricle effacement, no hydrocephalus or entrapment. Electronically Signed   By: Awilda Metro M.D.   On: 01/15/2016 01:54    Ct Head Wo Contrast  Result Date: 01/14/2016 CLINICAL DATA:  Embolic infarction EXAM: CT HEAD WITHOUT CONTRAST TECHNIQUE: Contiguous axial images were obtained from the base of the skull through the vertex without intravenous contrast. COMPARISON:  MRI 2016-01-25 FINDINGS: Brain: Developing hypodensity in the right MCA territory compatible with acute infarct. Similar territory to diffusion-weighted imaging yesterday. This involves the temporal lobe, insula, and portions of the right frontal parietal lobe. No associated hemorrhage. No shift of the midline structures. Ventricle size normal.  Negative for intracranial hemorrhage. Vascular: Hyperdense right MCA compatible with thrombus, less prominent on today's study compared with yesterday. Skull: Negative Sinuses/Orbits: Mild mucosal edema in the paranasal sinuses Other: None IMPRESSION: Developing hypodensity in the right MCA territory compatible with acute infarct. Similar territory to MRI yesterday. Negative for hemorrhage. Electronically Signed   By: Marlan Palau M.D.   On: 01/14/2016 10:16   Ct Angio Chest Pe W Or Wo Contrast  Result Date: 01/14/2016 CLINICAL  DATA:  Tachypnea EXAM: CT ANGIOGRAPHY CHEST WITH CONTRAST TECHNIQUE: Multidetector CT imaging of the chest was performed using the standard protocol during bolus administration of intravenous contrast. Multiplanar CT image reconstructions and MIPs were obtained to evaluate the vascular anatomy. CONTRAST:  100 cc Isovue 370 IV COMPARISON:  CXR 01/14/2016, CT AP 08/27/2011 FINDINGS: Cardiovascular: Cardiomegaly with coronary arteriosclerosis. No significant pericardial effusion or thickening. No aortic aneurysm noted. Aortic atherosclerosis. No large central pulmonary embolus to the third order branches allowing for slight respiratory motion artifacts especially to the lower lobes. What appear to be tiny filling defects in the peripheral pulmonary arterial system are believed to be branch points for  pulmonary vessels. Mediastinum/Nodes: Unremarkable thyroid. Trachea is patent. Esophagus contains a gastric tube which terminates in the distal stomach. No mediastinal nor hilar lymphadenopathy. Lungs/Pleura: Small bilateral layering pleural effusions with adjacent atelectasis. No pneumonic consolidation or dominant mass. No pneumothorax. Calcified granuloma in the right upper lobe. Upper Abdomen: Nonacute. Subtle hyperdensity along the dependent aspect of the included gallbladder may represent partial visualization of previously noted gallstones. Musculoskeletal: Multilevel degenerative disc space narrowing and osteophyte formation consistent with thoracolumbar spondylosis. Sternoclavicular joint osteoarthritis. No acute osseous abnormality. Review of the MIP images confirms the above findings. IMPRESSION: No acute large central pulmonary embolus. Cardiomegaly with coronary arteriosclerosis and aortic atherosclerosis. Small bilateral pleural effusions with compressive atelectasis. Right upper lobe granuloma. Cholelithiasis. Electronically Signed   By: Tollie Eth M.D.   On: 01/14/2016 17:24   Dg Chest Port 1 View  Result Date: 01/15/2016 CLINICAL DATA:  Shortness of breath EXAM: PORTABLE CHEST 1 VIEW COMPARISON:  01/14/2016 FINDINGS: Interim placement of esophageal tube, this extends below the diaphragm but is not included on the image. There is stable cardiomegaly. There is interval increase in bibasilar right greater than left opacities which may reflect atelectasis, pneumonia or aspiration. Atherosclerosis of the arch. No pneumothorax. IMPRESSION: 1. Interval development of bibasilar right greater than left opacification which may be due to atelectasis or infiltrate 2. Stable cardiomegaly Electronically Signed   By: Jasmine Pang M.D.   On: 01/15/2016 00:03   Dg Chest Port 1 View  Result Date: 01/14/2016 CLINICAL DATA:  Respiratory distress EXAM: PORTABLE CHEST 1 VIEW COMPARISON:  None. FINDINGS: There is  no edema or consolidation. Heart is mildly enlarged with pulmonary vascularity within normal limits. No adenopathy. There is atherosclerotic calcification in the aorta. No evident bone lesions. IMPRESSION: Mild cardiac enlargement. No edema or consolidation. There is aortic atherosclerosis. Electronically Signed   By: Bretta Bang III M.D.   On: 01/14/2016 14:15   Dg Abd Portable 1v  Result Date: 01/14/2016 CLINICAL DATA:  Evaluate feeding tube. EXAM: PORTABLE ABDOMEN - 1 VIEW COMPARISON:  Abdominal CT 08/27/2011 FINDINGS: Feeding tube tip in the gastric pyloric region. Nonobstructive bowel gas pattern with oral contrast seen throughout the colon. IMPRESSION: Feeding tube tip at the gastric pylorus. Electronically Signed   By: Marnee Spring M.D.   On: 01/14/2016 15:43     STUDIES:  CT head 1/2 > Acute multifocal small to moderate MCA territory infarct, no hemorrhage. Dense RIGHT middle cerebral artery consistent with thromboembolism. MR brain 1/2 > Large evolving acute right MCA territory infarct as above. Associated petechial hemorrhage without frank hemorrhagic transformation. No significant mass effect at this time. CT head 1/3 > Developing hypodensity in the right MCA territory compatible with acute infarct. Similar territory to MRI yesterday. Negative for hemorrhage. CT head 1/4 > Evolving large RIGHT MCA territory infarct with  petechial hemorrhage, no hemorrhagic conversion. 1-2 mm RIGHT to LEFT midline shift with slight RIGHT lateral ventricle effacement, no hydrocephalus or entrapment.  CULTURES: RVP 1/4 >  ANTIBIOTICS: Unasyn 1/4 >>>  SIGNIFICANT EVENTS: 1/2 admit for stroke  LINES/TUBES:   DISCUSSION:   ASSESSMENT / PLAN:  NEUROLOGIC A:   Large R MCA distribution CVA Acute encephalopathy worsening 1/4 (repeat CT without change, ABG wnl, repeat labs benign)  P:   RASS goal: 0 Per stroke service  PULMONARY A: Acute hypoxemic respiratory failure  ?Aspiration  event vs ATX  P:   BiPAP Do not intubate per wife CXR and ABG noted Pulmonary hygiene ABX as below  CARDIOVASCULAR A:  Atrial fibrillation (rate controlled)  P:  Telemetry Continue  xarelto, statin, digoxin, metoprolol per tube, and hydralazine PRN   RENAL A:   No acute issues  P:   Follow BMP  GASTROINTESTINAL A:   Dysphagia secondary to stroke  P:   NPO Hold TF  HEMATOLOGIC A:   Coagulopathy secondary to anticoagulation  P:  Continue eliquis Trend CBC  INFECTIOUS A:   Suspect Aspiration PNA  P:   Start unasyn BC x 2   ENDOCRINE A:   Hypothyroid  P:   Continue syntroid    FAMILY  - Updates: Wife updated at length. Does not want to put him through aggressive procedures. DNR/DNI. Hopeful that his son will be able to make it here in time tomorrow.   - Inter-disciplinary family meet or Palliative Care meeting due by:  1/10    Joneen Roach, AGACNP-BC [facility] Pulmonology/Critical Care Pager [phone] or [phone]  01/15/2016 2:46 AM

## 2016-01-15 NOTE — Progress Notes (Signed)
PT Cancellation Note  Patient Details Name: Jerry [name] MRN: [id number] DOB: 1931-12-14   Cancelled Treatment:    Reason Eval/Treat Not Completed: Medical issues which prohibited therapy Holding PT tx today per RN as pt continues to be on BiPAP- attempting trial without it. Will follow up tomorrow.   Blake DivineShauna A Florrie Ramires 01/15/2016, 11:59 AM Mylo RedShauna Joshiah Traynham, PT, DPT [phone]

## 2016-01-15 NOTE — Progress Notes (Signed)
Noted plans as outlined. We will sign off at this time. [phone] 

## 2016-01-15 NOTE — Significant Event (Signed)
Rapid Response Event Note RN called for increase WOB, RT at bedside Overview: Time Called: 2323 Arrival Time: 2325 Event Type: Respiratory  Initial Focused Assessment: On arrival RT at bedside performing NTS suctioning removing moderate amount of upper airway secretions. Stas were 98% on NRB. Pt has been nonverbal for last 2 days but RN reports pt able to open eyes and follow commands. Pt was not following commands, no verbal response, not opening eyes at all, does grunt with and grimace with sternal rub. Bilateral breath sounds diminished. Shallow respirations.   Interventions: CXR, ABG, Ct of head, Xopenex breathing treatment given. Pt placed on Bipap increase O2 to 98-100%. Pt transferred to 203m05 with plan to intubate. Wife at bedside spoke with Renae FicklePaul NP and Dr. Vassie LollAlva both with PCCM and has decided to make patient a DNR at ths ime.     Event Summary: Name of Physician Notified: Schorr NP at  (PTA RRT )    at    Outcome: Transferred (Comment) ([id number])  Event End Time: 0100  Jerry FractionSHULAR, Jerry [name] Paige

## 2016-01-15 NOTE — Progress Notes (Signed)
Pt having increased work of breathing, O2 sats in the 80's on 6L [place]. Increased RR, pt restless/ agitated. MD paged and came to bedside.RRT and Rapid response nurse called to bedside. Pt placed on NRB sats 100%. Neuro status changed from earlier assessment. Pt now responding to pain, unable to open eyes or follow commands. RRT preformed NTS and suction up moderate amount of secretions, placed pt on BIPAP. CXR, Head CT, ABG preformed. Pt transferred to ICU 3M05 on BIPAP. All belonging taken with patient and report given to RN at bedside.

## 2016-01-15 NOTE — Progress Notes (Signed)
OT Cancellation Note  Patient Details Name: Jerry [name] MRN: [id number] DOB: Jul 17, 1931   Cancelled Treatment:    Reason Eval/Treat Not Completed: Medical issues which prohibited therapy. MD asked to hold today due to respiratory status. Will follow up tomorrow.  [facility]  Cashlyn Huguley, OT/L  [phone] 01/15/2016 01/15/2016, 3:25 PM

## 2016-01-16 LAB — GLUCOSE, CAPILLARY
GLUCOSE-CAPILLARY: 159 mg/dL — AB (ref 65–99)
GLUCOSE-CAPILLARY: 144 mg/dL — AB (ref 65–99)
GLUCOSE-CAPILLARY: 169 mg/dL — AB (ref 65–99)
Glucose-Capillary: 175 mg/dL — ABNORMAL HIGH (ref 65–99)
Glucose-Capillary: 171 mg/dL — ABNORMAL HIGH (ref 65–99)

## 2016-01-16 LAB — PHOSPHORUS
PHOSPHORUS: 1.3 mg/dL — AB (ref 2.5–4.6)
Phosphorus: 1.7 mg/dL — ABNORMAL LOW (ref 2.5–4.6)

## 2016-01-16 LAB — CBC
HEMATOCRIT: 37.7 % — AB (ref 39.0–52.0)
Hemoglobin: 12.9 g/dL — ABNORMAL LOW (ref 13.0–17.0)
MCH: 33.5 pg (ref 26.0–34.0)
MCHC: 34.2 g/dL (ref 30.0–36.0)
MCV: 97.9 fL (ref 78.0–100.0)
PLATELETS: 191 10*3/uL (ref 150–400)
RBC: 3.85 MIL/uL — AB (ref 4.22–5.81)
RDW: 14.4 % (ref 11.5–15.5)
WBC: 12.7 10*3/uL — ABNORMAL HIGH (ref 4.0–10.5)

## 2016-01-16 LAB — BASIC METABOLIC PANEL
Anion gap: 7 (ref 5–15)
BUN: 23 mg/dL — AB (ref 6–20)
CO2: 25 mmol/L (ref 22–32)
Calcium: 8.7 mg/dL — ABNORMAL LOW (ref 8.9–10.3)
Chloride: 111 mmol/L (ref 101–111)
Creatinine, Ser: 0.82 mg/dL (ref 0.61–1.24)
GFR calc Af Amer: >60 mL/min (ref >60)
GLUCOSE: 172 mg/dL — AB (ref 65–99)
POTASSIUM: 3.3 mmol/L — AB (ref 3.5–5.1)
Sodium: 143 mmol/L (ref 135–145)

## 2016-01-16 LAB — MAGNESIUM
MAGNESIUM: 2.3 mg/dL (ref 1.7–2.4)
Magnesium: 2.3 mg/dL (ref 1.7–2.4)

## 2016-01-16 MED ORDER — SODIUM CHLORIDE 0.9 % IV SOLN
30.0000 meq | Freq: Once | INTRAVENOUS | Status: DC
  Filled 2016-01-16: qty 15

## 2016-01-16 MED ORDER — ALBUTEROL SULFATE (2.5 MG/3ML) 0.083% IN NEBU
2.5000 mg | INHALATION_SOLUTION | RESPIRATORY_TRACT | Status: DC | PRN
  Administered 2016-01-16 – 2016-01-18 (×10): 2.5 mg via RESPIRATORY_TRACT
  Filled 2016-01-16 (×11): qty 3

## 2016-01-16 MED ORDER — POTASSIUM CHLORIDE 20 MEQ/15ML (10%) PO SOLN
40.0000 meq | Freq: Once | ORAL | Status: AC
  Administered 2016-01-16: 40 meq
  Filled 2016-01-16: qty 30

## 2016-01-16 MED ORDER — ACETYLCYSTEINE 20 % IN SOLN
3.0000 mL | RESPIRATORY_TRACT | Status: DC
  Filled 2016-01-16 (×2): qty 4

## 2016-01-16 MED ORDER — ACETYLCYSTEINE 20 % IN SOLN
4.0000 mL | RESPIRATORY_TRACT | Status: DC
  Administered 2016-01-16 – 2016-01-18 (×10): 4 mL via RESPIRATORY_TRACT
  Filled 2016-01-16 (×13): qty 4

## 2016-01-16 NOTE — Progress Notes (Signed)
PT Cancellation Note  Patient Details Name: Lorna FewHarold D Weimer MRN: [id number] DOB: [birth year]   Cancelled Treatment:    Reason Eval/Treat Not Completed: Medical issues which prohibited therapy Pt not appropriate for PT services due to respiratory status - going on and off biPAP and recently made DNR. Will follow up for appropriateness.   Blake DivineShauna A Adrieana Fennelly 01/16/2016, 9:47 AM Mylo RedShauna Iran [place], PT, DPT [phone]

## 2016-01-16 NOTE — Progress Notes (Signed)
Pt taken off bipap and placed on 4L [place]. VS within normal limits. Pt in no distress at this time. RT will continue to monitor.

## 2016-01-16 NOTE — Progress Notes (Signed)
Influenza PCS 01/15/16 Negative; no longer on precautions.

## 2016-01-16 NOTE — Progress Notes (Signed)
PROGRESS NOTE    GEE HABIG  ZOX:[id number] DOB: Dec 19, 1931 DOA: 17-Jan-2016 PCP: Ezequiel Kayser, MD   Brief Narrative:  83 y.o. male who presents with acute onset of left facial droop with LUE and LLE weakness. Pt with R MCA stroke and Neurology on board.   Assessment & Plan:   Principal Problem:   Acute ischemic right MCA stroke [facility]) - Neurology on board and managing.  Active Problems:   ATRIAL FIBRILLATION - Pt on aspirin - rate controlled of rate controlling agent on board    Essential hypertension - allow for permissive hypertension  Tachypnea - Pt is on intermittent bipap. Critical care team assisted yesterday. He is DNI/DNR.  - step down monitoring for now.  Hypokalemia - replacing enterally. Pt has ng tube in place.    Hypothyroidism - stable continue synthroid    CVA (cerebral vascular accident) [facility]) - Neurology managing. Pt undergoing work up    Hyperlipidemia - stable   DVT prophylaxis: Lovenox Code Status: Full Family Communication: none at bedside Disposition Plan: pending improvement in condition   Consultants:   Neurology   Procedures: None   Antimicrobials: None  Subjective: Pt has no new complaints. Has waxing and waning moments.  Objective: Vitals:   01/16/16 0500 01/16/16 0600 01/16/16 0700 01/16/16 0800  BP: (!) 167/91 (!) 170/86 (!) 203/99 (!) 165/83  Pulse: 75 85 86 88  Resp: (!) 24 (!) 25 (!) 26 (!) 26  Temp:    99.5 F (37.5 C)  TempSrc:    Oral  SpO2: 97% 98% 98% 98%  Weight:  85.7 kg (188 lb 15 oz)    Height:        Intake/Output Summary (Last 24 hours) at 01/16/16 1014 Last data filed at 01/16/16 0800  Gross per 24 hour  Intake           1533.5 ml  Output              605 ml  Net            928.5 ml   Filed Weights   2016/01/17 0429 01/17/2016 0656 01/16/16 0600  Weight: 87.2 kg (192 lb 5 oz) 87 kg (191 lb 12.8 oz) 85.7 kg (188 lb 15 oz)    Examination:  General exam: calm and comfortable, in  nad. Respiratory system: elevated RR no accessory muscle use, no wheezes, equal chest rise. Cardiovascular system: S1 & S2 heard, IRR. No JVD, rubs Gastrointestinal system: Abdomen is nondistended, soft and nontender. No organomegaly or masses felt. Normal bowel sounds heard. Central nervous system: L sided weakness, facial asymmetry Extremities: no cyanosis, no clubbing Skin: No rashes, lesions or ulcers Psychiatry: unable to assess due to limited pt cooperation  Data Reviewed: I have personally reviewed following labs and imaging studies  CBC:  Recent Labs Lab 01-17-16 0350 01-17-16 0356 01/16/16 0439  WBC 8.8  --  12.7*  NEUTROABS 5.1  --   --   HGB 14.4 14.3 12.9*  HCT 42.4 42.0 37.7*  MCV 97.7  --  97.9  PLT 216  --  191   Basic Metabolic Panel:  Recent Labs Lab 01-17-16 0350 17-Jan-2016 0356 01/14/16 0850 01/15/16 1405 01/15/16 1656 01/16/16 0439  NA 138 140 138  --   --  143  K 3.9 3.8 4.1  --   --  3.3*  CL 103 104 106  --   --  111  CO2 26  --  22  --   --  25  GLUCOSE 155* 151* 172*  --   --  172*  BUN 9 12 13   --   --  23*  CREATININE 0.92 0.90 0.98  --   --  0.82  CALCIUM 9.5  --  9.0  --   --  8.7*  MG  --   --  1.9 2.2 2.4 2.3  PHOS  --   --   --  3.0 2.8 1.7*   GFR: Estimated Creatinine Clearance: 78.5 mL/min (by C-G formula based on SCr of 0.82 mg/dL). Liver Function Tests:  Recent Labs Lab 01/16/2016 0350 01/14/16 0850  AST 34 34  ALT 34 28  ALKPHOS 77 58  BILITOT 1.5* 3.0*  PROT 7.2 6.6  ALBUMIN 4.1 3.6   No results for input(s): LIPASE, AMYLASE in the last 168 hours. No results for input(s): AMMONIA in the last 168 hours. Coagulation Profile:  Recent Labs Lab 02/07/2016 0350  INR 1.89   Cardiac Enzymes:  Recent Labs Lab 01/24/2016 0730  TROPONINI <0.03   BNP (last 3 results) No results for input(s): PROBNP in the last 8760 hours. HbA1C: No results for input(s): HGBA1C in the last 72 hours. CBG:  Recent Labs Lab  01/15/16 1628 01/15/16 1952 01/15/16 2346 01/16/16 0405 01/16/16 0752  GLUCAP 146* 175* 177* 175* 159*   Lipid Profile: No results for input(s): CHOL, HDL, LDLCALC, TRIG, CHOLHDL, LDLDIRECT in the last 72 hours. Thyroid Function Tests: No results for input(s): TSH, T4TOTAL, FREET4, T3FREE, THYROIDAB in the last 72 hours. Anemia Panel: No results for input(s): VITAMINB12, FOLATE, FERRITIN, TIBC, IRON, RETICCTPCT in the last 72 hours. Sepsis Labs: No results for input(s): PROCALCITON, LATICACIDVEN in the last 168 hours.  Recent Results (from the past 240 hour(s))  MRSA PCR Screening     Status: None   Collection Time: 01/23/2016  7:09 AM  Result Value Ref Range Status   MRSA by PCR NEGATIVE NEGATIVE Final    Comment:        The GeneXpert MRSA Assay (FDA approved for NASAL specimens only), is one component of a comprehensive MRSA colonization surveillance program. It is not intended to diagnose MRSA infection nor to guide or monitor treatment for MRSA infections.   Culture, blood (Routine X 2) w Reflex to ID Panel     Status: None (Preliminary result)   Collection Time: 01/15/16  4:40 AM  Result Value Ref Range Status   Specimen Description BLOOD RIGHT ARM  Final   Special Requests IN PEDIATRIC BOTTLE 3CC  Final   Culture PENDING  Incomplete   Report Status PENDING  Incomplete         Radiology Studies: Ct Head Wo Contrast  Result Date: 01/15/2016 CLINICAL DATA:  Altered mental status, follow-up stroke. History of atrial fibrillation, hypertension, dyslipidemia. EXAM: CT HEAD WITHOUT CONTRAST TECHNIQUE: Contiguous axial images were obtained from the base of the skull through the vertex without intravenous contrast. COMPARISON:  CT HEAD January 13, 2006 and priors, MRI of the head January 12, 2006 FINDINGS: BRAIN: Confluent RIGHT frontotemporal parietal hypodensity, patchy hypodensity RIGHT basal ganglia. Minimal petechial hemorrhage without lobar hematoma. Local sulcal  effacement. 1-2 mm LEFT-to-RIGHT midline shift results in slight effacement of RIGHT lateral ventricle, no hydrocephalus or entrapment. No new acute large vascular territory infarcts. Moderate white matter changes exclusive of the aforementioned abnormality compatible with chronic small vessel ischemic disease. No abnormal extra-axial fluid collections. Basal cisterns are patent. VASCULAR: Dense RIGHT carotid terminus to MCA compatible with thromboembolism. Mild calcific  atherosclerosis the carotid siphons. SKULL: No skull fracture. No significant scalp soft tissue swelling. SINUSES/ORBITS: The mastoid air-cells and included paranasal sinuses are well-aerated.The included ocular globes and orbital contents are non-suspicious. OTHER: None. IMPRESSION: Evolving large RIGHT MCA territory infarct with petechial hemorrhage, no hemorrhagic conversion. 1-2 mm RIGHT to LEFT midline shift with slight RIGHT lateral ventricle effacement, no hydrocephalus or entrapment. Electronically Signed   By: Awilda Metroourtnay  Bloomer M.D.   On: 01/15/2016 01:54   Ct Angio Chest Pe W Or Wo Contrast  Result Date: 01/14/2016 CLINICAL DATA:  Tachypnea EXAM: CT ANGIOGRAPHY CHEST WITH CONTRAST TECHNIQUE: Multidetector CT imaging of the chest was performed using the standard protocol during bolus administration of intravenous contrast. Multiplanar CT image reconstructions and MIPs were obtained to evaluate the vascular anatomy. CONTRAST:  100 cc Isovue 370 IV COMPARISON:  CXR 01/14/2016, CT AP 08/27/2011 FINDINGS: Cardiovascular: Cardiomegaly with coronary arteriosclerosis. No significant pericardial effusion or thickening. No aortic aneurysm noted. Aortic atherosclerosis. No large central pulmonary embolus to the third order branches allowing for slight respiratory motion artifacts especially to the lower lobes. What appear to be tiny filling defects in the peripheral pulmonary arterial system are believed to be branch points for pulmonary vessels.  Mediastinum/Nodes: Unremarkable thyroid. Trachea is patent. Esophagus contains a gastric tube which terminates in the distal stomach. No mediastinal nor hilar lymphadenopathy. Lungs/Pleura: Small bilateral layering pleural effusions with adjacent atelectasis. No pneumonic consolidation or dominant mass. No pneumothorax. Calcified granuloma in the right upper lobe. Upper Abdomen: Nonacute. Subtle hyperdensity along the dependent aspect of the included gallbladder may represent partial visualization of previously noted gallstones. Musculoskeletal: Multilevel degenerative disc space narrowing and osteophyte formation consistent with thoracolumbar spondylosis. Sternoclavicular joint osteoarthritis. No acute osseous abnormality. Review of the MIP images confirms the above findings. IMPRESSION: No acute large central pulmonary embolus. Cardiomegaly with coronary arteriosclerosis and aortic atherosclerosis. Small bilateral pleural effusions with compressive atelectasis. Right upper lobe granuloma. Cholelithiasis. Electronically Signed   By: Tollie Ethavid  Kwon M.D.   On: 01/14/2016 17:24   Dg Chest Port 1 View  Result Date: 01/15/2016 CLINICAL DATA:  Shortness of breath EXAM: PORTABLE CHEST 1 VIEW COMPARISON:  01/14/2016 FINDINGS: Interim placement of esophageal tube, this extends below the diaphragm but is not included on the image. There is stable cardiomegaly. There is interval increase in bibasilar right greater than left opacities which may reflect atelectasis, pneumonia or aspiration. Atherosclerosis of the arch. No pneumothorax. IMPRESSION: 1. Interval development of bibasilar right greater than left opacification which may be due to atelectasis or infiltrate 2. Stable cardiomegaly Electronically Signed   By: Jasmine PangKim  Fujinaga M.D.   On: 01/15/2016 00:03   Dg Chest Port 1 View  Result Date: 01/14/2016 CLINICAL DATA:  Respiratory distress EXAM: PORTABLE CHEST 1 VIEW COMPARISON:  None. FINDINGS: There is no edema or  consolidation. Heart is mildly enlarged with pulmonary vascularity within normal limits. No adenopathy. There is atherosclerotic calcification in the aorta. No evident bone lesions. IMPRESSION: Mild cardiac enlargement. No edema or consolidation. There is aortic atherosclerosis. Electronically Signed   By: Bretta BangWilliam  Woodruff III M.D.   On: 01/14/2016 14:15   Dg Abd Portable 1v  Result Date: 01/15/2016 CLINICAL DATA:  NG tube placement. EXAM: PORTABLE ABDOMEN - 1 VIEW COMPARISON:  01/14/2016. FINDINGS: Feeding tube noted with tip projected over the distal stomach. No bowel distention. No other focal abnormality identified. IMPRESSION: Feeding tube noted with its tip projected over the distal stomach. Electronically Signed   By: Maisie Fushomas  Register  On: 01/15/2016 14:23   Dg Abd Portable 1v  Result Date: 01/14/2016 CLINICAL DATA:  Evaluate feeding tube. EXAM: PORTABLE ABDOMEN - 1 VIEW COMPARISON:  Abdominal CT 08/27/2011 FINDINGS: Feeding tube tip in the gastric pyloric region. Nonobstructive bowel gas pattern with oral contrast seen throughout the colon. IMPRESSION: Feeding tube tip at the gastric pylorus. Electronically Signed   By: Marnee Spring M.D.   On: 01/14/2016 15:43    Scheduled Meds: . acetylcysteine  4 mL Nebulization Q4H while awake  . alfuzosin  10 mg Oral Daily  . allopurinol  300 mg Oral Daily  . ampicillin-sulbactam (UNASYN) IV  3 g Intravenous Q6H  . aspirin  300 mg Rectal Daily  . aspirin  325 mg Per Tube Daily  . atorvastatin  40 mg Oral q1800  . chlorhexidine  15 mL Mouth Rinse BID  . digoxin  0.125 mg Oral Daily  . enoxaparin (LOVENOX) injection  40 mg Subcutaneous Q24H  . levothyroxine  100 mcg Oral QAC breakfast  . mouth rinse  15 mL Mouth Rinse q12n4p  . metoprolol tartrate  25 mg Oral BID   Continuous Infusions: . feeding supplement (VITAL AF 1.2 CAL) 1,000 mL (01/16/16 0827)     LOS: 3 days    Time spent: > 35 minutes   Penny Pia, MD [facility] Pager [phone]  If 7PM-7AM, please contact night-coverage [email or web address] Password [email or web address] 01/16/2016, 10:14 AM

## 2016-01-16 NOTE — Progress Notes (Signed)
STROKE TEAM PROGRESS NOTE   SUBJECTIVE (INTERVAL HISTORY) No family is at bedside. Pt is improving. Off BiPAP and on [place], tolerating well. More awake and alert today and without respiratory distress. Still has a lot of mucous at throat. On NT suctioning. Will do mucomyst neb.   OBJECTIVE Temp:  [97.5 F (36.4 C)-99.9 F (37.7 C)] 99.5 F (37.5 C) (01/05 0800) Pulse Rate:  [69-198] 88 (01/05 0800) Cardiac Rhythm: Atrial fibrillation (01/05 0800) Resp:  [22-32] 26 (01/05 0800) BP: (144-204)/(70-99) 165/83 (01/05 0800) SpO2:  [89 %-99 %] 98 % (01/05 0800) Weight:  [188 lb 15 oz (85.7 kg)] 188 lb 15 oz (85.7 kg) (01/05 0600)  CBC:   Recent Labs Lab 01/14/2016 0350 01/19/2016 0356 01/16/16 0439  WBC 8.8  --  12.7*  NEUTROABS 5.1  --   --   HGB 14.4 14.3 12.9*  HCT 42.4 42.0 37.7*  MCV 97.7  --  97.9  PLT 216  --  191    Basic Metabolic Panel:   Recent Labs Lab 01/14/16 0850  01/15/16 1656 01/16/16 0439  NA 138  --   --  143  K 4.1  --   --  3.3*  CL 106  --   --  111  CO2 22  --   --  25  GLUCOSE 172*  --   --  172*  BUN 13  --   --  23*  CREATININE 0.98  --   --  0.82  CALCIUM 9.0  --   --  8.7*  MG 1.9  < > 2.4 2.3  PHOS  --   < > 2.8 1.7*  < > = values in this interval not displayed.  Lipid Panel:     Component Value Date/Time   CHOL 139 01/15/2016 0730   TRIG 94 01/23/2016 0730   HDL 51 02/02/2016 0730   CHOLHDL 2.7 02/08/2016 0730   VLDL 19 01/21/2016 0730   LDLCALC 69 01/31/2016 0730   HgbA1c:  Lab Results  Component Value Date   HGBA1C 6.6 (H) 02/01/2016   Urine Drug Screen: No results found for: LABOPIA, COCAINSCRNUR, LABBENZ, AMPHETMU, THCU, LABBARB    IMAGING I have personally reviewed the radiological images below and agree with the radiology interpretations.  Ct Head Code Stroke W/o Cm 02/02/2016 1. Acute multifocal small to moderate MCA territory infarct, no hemorrhage. Dense RIGHT middle cerebral artery consistent with thromboembolism. 2.  ASPECTS is 7.   Ct Angio Head W Or Wo Contrast 02/05/2016 Emergent RIGHT M1 large vessel occlusion. Moderate stenosis RIGHT P3 segment.   Ct Angio Neck W And/or Wo Contrast 01/22/2016 Atherosclerosis without hemodynamically significant stenosis or acute vascular process.   Ct Cervical Spine Wo Contrast 02/04/2016 No acute cervical spine fracture or malalignment. Severe LEFT C3-4 through C5-6 neural foraminal narrowing.   Ct Cerebral Perfusion W Contrast 01/20/2016 Acute large RIGHT MCA territory infarct, with additional 72 cc penumbra/brain at risk.   Ct Head Wo Contrast 01/14/2016 IMPRESSION: Developing hypodensity in the right MCA territory compatible with acute infarct. Similar territory to MRI yesterday. Negative for hemorrhage.   Mr Brain Wo Contrast 02/05/2016 IMPRESSION: 1. Large evolving acute right MCA territory infarct as above. Associated petechial hemorrhage without frank hemorrhagic transformation. No significant mass effect at this time. 2. Underlying age-related cerebral atrophy with mild chronic microvascular ischemic disease.    Dg Chest Port 1 View 01/14/2016 IMPRESSION: Mild cardiac enlargement. No edema or consolidation. There is aortic atherosclerosis.   TTE The  patient was in atrial fibrillation. Normal LV size with   moderate LV hypertrophy. EF 60-65%. Mildly dilated RV with mildly   decreased systolic function. Moderate pulmonary hypertension.   Mild MR.  Ct Head Wo Contrast 01/15/2016  IMPRESSION: Evolving large RIGHT MCA territory infarct with petechial hemorrhage, no hemorrhagic conversion. 1-2 mm RIGHT to LEFT midline shift with slight RIGHT lateral ventricle effacement, no hydrocephalus or entrapment.   Ct Angio Chest Pe W Or Wo Contrast 01/14/2016 IMPRESSION: No acute large central pulmonary embolus. Cardiomegaly with coronary arteriosclerosis and aortic atherosclerosis. Small bilateral pleural effusions with compressive atelectasis. Right upper lobe granuloma.    Dg Chest Port 1 View 01/15/2016 IMPRESSION: 1. Interval development of bibasilar right greater than left opacification which may be due to atelectasis or infiltrate 2. Stable cardiomegaly.   PHYSICAL EXAM  Temp:  [97.5 F (36.4 C)-99.9 F (37.7 C)] 99.5 F (37.5 C) (01/05 0800) Pulse Rate:  [69-198] 88 (01/05 0800) Resp:  [22-32] 26 (01/05 0800) BP: (144-204)/(70-99) 165/83 (01/05 0800) SpO2:  [89 %-99 %] 98 % (01/05 0800) Weight:  [188 lb 15 oz (85.7 kg)] 188 lb 15 oz (85.7 kg) (01/05 0600)  General - Well nourished, well developed, in no acute respiratory distress.  Ophthalmologic - Fundi not visualized due to noncooperation.  Cardiovascular - irregularly irregular heart rate and rhythm.  Neuro - awake and alert, eyes open, orientated to year, place, age, self and people, not orientated to month. Able to follow all peripheral and midline commands. Eyes right gaze preference, but able to cross over midline. PERRL. Left facial droop. Severe dysarthria. Spontaneous movement at RUE and RLE, 4/5 LUE proximal and 3/5 distal, and 3/5 LLE. Bulk was normal and fasciculations were absent. Muscle tone was normal. DTR 1+ in all extremities and he had no pathological reflexes. Sensation, coordination and gait not cooperative.   ASSESSMENT/PLAN [name] is a 83 y.o. male with history of AF on xarelto, HTN, HLD, hypothyroidism and gout presenting with L side weakness and facial droop. He did not receive IV t-PA.   Stroke:  Non-dominant right MCA large infarct embolic most likely source secondary to known atrial fibrillation   Resultant  left hemiparesis and left neglect  Code Stroke CT  R MCA territory infarcts. Dense R MCA  CTA head  Emergent R M1 large vessel occlusion, moderate R P3 stenosis  CTA neck  Atherosclerosis w/o hemodynamic stenosis  CTP  Large R MCA core infarct, and small penumbra   MRI  Large R MCA infarct but no midline shift  Repeat CT - evolving right  MCA infarct and no hemorrhage  2D Echo  EF 60-65%   LDL 69  HgbA1c 6.6  lovenox for VTE prophylaxis  Xarelto (rivaroxaban) daily prior to admission, now on aspirin 300 mg suppository daily due to large infarct. Consider to switch to eliquis in one week.   Patient counseled to be compliant with his antithrombotic medications  Ongoing aggressive stroke risk factor management  Therapy recommendations:  pending   Disposition:  pending   Respiratory distress, resolved  CXR right greater than left opacification, concerning for aspiration  On Unasyn  ABG improved on BiPAP  CTA chest ruled out PE  Now on [place], tolerating well  Mucomyst nebulization  NT suctioning  Atrial Fibrillation  Home anticoagulation:  Xarelto (rivaroxaban) daily   Failed Xarelto, now on aspirin 300 supp given large infarct  Consider to swtich to eliquis in on week  Rate controlled now  on  metoprolol 25mg  bid  Resume digoxin    Hypertension  Stable Permissive hypertension (OK if < 220/120) but gradually normalize in 5-7 days  On metoprolol Long-term BP goal normotensive  Hyperlipidemia  Home meds:  lipitor 40 and lovaza  Resumed lipitor 40  LDL 69, goal < 70  Continue statin at discharge  Other Stroke Risk Factors  Advanced age  Family hx stroke (father)  Other Active Problems  Hypothyroidism - on synthroid  Hospital day # 3  This patient is critically ill due to large right MCA infarct, afib with RVR, significant respiratory distress, dysphagia and at significant risk of neurological worsening, death form recurrent stroke, heart failure, hemorrhagic transformation, aspiration pneumonia, PE and respiratory failure. This patient's care requires constant monitoring of vital signs, hemodynamics, respiratory and cardiac monitoring, review of multiple databases, neurological assessment, discussion with family, other specialists and medical decision making of high complexity. I  spent 35 minutes of neurocritical care time in the care of this patient.   Marvel PlanJindong Pax Reasoner, MD PhD Stroke Neurology 01/16/2016 10:10 AM   To contact Stroke Continuity provider, please refer to [email or web address]. After hours, contact General Neurology

## 2016-01-16 NOTE — Progress Notes (Signed)
OT Cancellation Note  Patient Details Name: Jerry [name] MRN: [id number] DOB: Nov 21, 1931   Cancelled Treatment:    Reason Eval/Treat Not Completed: Patient not medically ready Pt currently with respiratory status preventing participation per RN request  Felecia ShellingJones, Ayub Kirsh B   Jillisa Harris, Brynn   OTR/L Pager: [phone] Office: [phone] .  01/16/2016, 10:52 AM

## 2016-01-17 ENCOUNTER — Inpatient Hospital Stay (HOSPITAL_COMMUNITY): Payer: Medicare Other

## 2016-01-17 LAB — GLUCOSE, CAPILLARY
GLUCOSE-CAPILLARY: 171 mg/dL — AB (ref 65–99)
GLUCOSE-CAPILLARY: 180 mg/dL — AB (ref 65–99)
GLUCOSE-CAPILLARY: 164 mg/dL — AB (ref 65–99)
Glucose-Capillary: 172 mg/dL — ABNORMAL HIGH (ref 65–99)
Glucose-Capillary: 150 mg/dL — ABNORMAL HIGH (ref 65–99)

## 2016-01-17 MED ORDER — INSULIN ASPART 100 UNIT/ML ~~LOC~~ SOLN: 0.0000 [IU] | Freq: Three times a day (TID) | SUBCUTANEOUS | Status: DC

## 2016-01-17 MED ORDER — INSULIN ASPART 100 UNIT/ML ~~LOC~~ SOLN
0.0000 [IU] | Freq: Three times a day (TID) | SUBCUTANEOUS | Status: DC
  Administered 2016-01-17: 1 [IU] via SUBCUTANEOUS
  Administered 2016-01-17: 2 [IU] via SUBCUTANEOUS
  Administered 2016-01-18: 1 [IU] via SUBCUTANEOUS
  Administered 2016-01-18 – 2016-01-19 (×3): 2 [IU] via SUBCUTANEOUS
  Administered 2016-01-19 – 2016-01-20 (×5): 1 [IU] via SUBCUTANEOUS
  Administered 2016-01-21: 2 [IU] via SUBCUTANEOUS
  Administered 2016-01-21: 1 [IU] via SUBCUTANEOUS
  Administered 2016-01-22: 2 [IU] via SUBCUTANEOUS
  Administered 2016-01-22 – 2016-01-23 (×4): 1 [IU] via SUBCUTANEOUS
  Administered 2016-01-24: 2 [IU] via SUBCUTANEOUS
  Administered 2016-01-24 (×2): 1 [IU] via SUBCUTANEOUS
  Administered 2016-01-25 (×2): 2 [IU] via SUBCUTANEOUS
  Administered 2016-01-26 – 2016-01-27 (×4): 1 [IU] via SUBCUTANEOUS

## 2016-01-17 NOTE — Progress Notes (Signed)
PROGRESS NOTE    Jerry [name]  ZOX:[id number] DOB: 12/23/1931 DOA: 2016/02/02 PCP: Ezequiel Kayser, MD   Brief Narrative:  83 y.o. male who presents with acute onset of left facial droop with LUE and LLE weakness. Pt with R MCA stroke and Neurology on board.   Assessment & Plan:   Principal Problem:   Acute ischemic right MCA stroke [facility]) - Neurology on board and managing.  Active Problems:   ATRIAL FIBRILLATION - Pt on aspirin - rate controlled of rate controlling agent on board    Essential hypertension - allow for permissive hypertension  Tachypnea - Pt is on intermittent bipap. He is DNI/DNR.  - Continue step down monitoring for now.  Hypokalemia - replacing enterally. Pt has ng tube in place.    Hypothyroidism - stable continue synthroid    Hyperlipidemia - stable   DVT prophylaxis: Lovenox Code Status: Full Family Communication: none at bedside Disposition Plan: pending improvement in condition   Consultants:   Neurology   Procedures: None   Antimicrobials: None  Subjective: Pt resting in bed sitting up.   Objective: Vitals:   01/17/16 0015 01/17/16 0442 01/17/16 0748 01/17/16 1101  BP: (!) 175/103 (!) 171/92    Pulse: 95 96    Resp: (!) 23 (!) 25    Temp: 98 F (36.7 C) 98.9 F (37.2 C)    TempSrc: Axillary Axillary    SpO2: 100% 97% 98% 98%  Weight:  85.6 kg (188 lb 11.4 oz)    Height:        Intake/Output Summary (Last 24 hours) at 01/17/16 1107 Last data filed at 01/17/16 0700  Gross per 24 hour  Intake             1460 ml  Output             1175 ml  Net              285 ml   Filed Weights   02-02-2016 0656 01/16/16 0600 01/17/16 0442  Weight: 87 kg (191 lb 12.8 oz) 85.7 kg (188 lb 15 oz) 85.6 kg (188 lb 11.4 oz)    Examination:  General exam: calm and comfortable, in nad. Respiratory system: elevated RR no accessory muscle use, no wheezes, equal chest rise. Cardiovascular system: S1 & S2 heard, IRR. No   rubs Gastrointestinal system: Abdomen is nondistended, soft and nontender. No organomegaly or masses felt. Normal bowel sounds heard. Central nervous system: L sided weakness, facial asymmetry Extremities: no cyanosis, no clubbing Skin: No rashes, lesions or ulcers Psychiatry: unable to assess due to limited pt cooperation  Data Reviewed: I have personally reviewed following labs and imaging studies  CBC:  Recent Labs Lab 02/02/16 0350 02/02/2016 0356 01/16/16 0439  WBC 8.8  --  12.7*  NEUTROABS 5.1  --   --   HGB 14.4 14.3 12.9*  HCT 42.4 42.0 37.7*  MCV 97.7  --  97.9  PLT 216  --  191   Basic Metabolic Panel:  Recent Labs Lab 02-02-2016 0350 02-02-2016 0356 01/14/16 0850 01/15/16 1405 01/15/16 1656 01/16/16 0439 01/16/16 1617  NA 138 140 138  --   --  143  --   K 3.9 3.8 4.1  --   --  3.3*  --   CL 103 104 106  --   --  111  --   CO2 26  --  22  --   --  25  --   GLUCOSE 155*  151* 172*  --   --  172*  --   BUN 9 12 13   --   --  23*  --   CREATININE 0.92 0.90 0.98  --   --  0.82  --   CALCIUM 9.5  --  9.0  --   --  8.7*  --   MG  --   --  1.9 2.2 2.4 2.3 2.3  PHOS  --   --   --  3.0 2.8 1.7* 1.3*   GFR: Estimated Creatinine Clearance: 78.5 mL/min (by C-G formula based on SCr of 0.82 mg/dL). Liver Function Tests:  Recent Labs Lab 01/17/16 0350 01/14/16 0850  AST 34 34  ALT 34 28  ALKPHOS 77 58  BILITOT 1.5* 3.0*  PROT 7.2 6.6  ALBUMIN 4.1 3.6   No results for input(s): LIPASE, AMYLASE in the last 168 hours. No results for input(s): AMMONIA in the last 168 hours. Coagulation Profile:  Recent Labs Lab 01/17/16 0350  INR 1.89   Cardiac Enzymes:  Recent Labs Lab Jan 17, 2016 0730  TROPONINI <0.03   BNP (last 3 results) No results for input(s): PROBNP in the last 8760 hours. HbA1C: No results for input(s): HGBA1C in the last 72 hours. CBG:  Recent Labs Lab 01/16/16 1134 01/16/16 1556 01/16/16 2042 01/17/16 0441 01/17/16 0846  GLUCAP 171* 144*  169* 171* 172*   Lipid Profile: No results for input(s): CHOL, HDL, LDLCALC, TRIG, CHOLHDL, LDLDIRECT in the last 72 hours. Thyroid Function Tests: No results for input(s): TSH, T4TOTAL, FREET4, T3FREE, THYROIDAB in the last 72 hours. Anemia Panel: No results for input(s): VITAMINB12, FOLATE, FERRITIN, TIBC, IRON, RETICCTPCT in the last 72 hours. Sepsis Labs: No results for input(s): PROCALCITON, LATICACIDVEN in the last 168 hours.  Recent Results (from the past 240 hour(s))  MRSA PCR Screening     Status: None   Collection Time: 17-Jan-2016  7:09 AM  Result Value Ref Range Status   MRSA by PCR NEGATIVE NEGATIVE Final    Comment:        The GeneXpert MRSA Assay (FDA approved for NASAL specimens only), is one component of a comprehensive MRSA colonization surveillance program. It is not intended to diagnose MRSA infection nor to guide or monitor treatment for MRSA infections.   Culture, blood (Routine X 2) w Reflex to ID Panel     Status: None (Preliminary result)   Collection Time: 01/15/16  4:40 AM  Result Value Ref Range Status   Specimen Description BLOOD RIGHT ARM  Final   Special Requests IN PEDIATRIC BOTTLE 3CC  Final   Culture NO GROWTH 1 DAY  Final   Report Status PENDING  Incomplete  Culture, blood (Routine X 2) w Reflex to ID Panel     Status: None (Preliminary result)   Collection Time: 01/15/16  4:50 AM  Result Value Ref Range Status   Specimen Description BLOOD RIGHT HAND  Final   Special Requests BOTTLES DRAWN AEROBIC ONLY 7CC  Final   Culture NO GROWTH 1 DAY  Final   Report Status PENDING  Incomplete         Radiology Studies: Dg Abd Portable 1v  Result Date: 01/15/2016 CLINICAL DATA:  NG tube placement. EXAM: PORTABLE ABDOMEN - 1 VIEW COMPARISON:  01/14/2016. FINDINGS: Feeding tube noted with tip projected over the distal stomach. No bowel distention. No other focal abnormality identified. IMPRESSION: Feeding tube noted with its tip projected over the  distal stomach. Electronically Signed   By: Maisie Fus  Register   On: 01/15/2016 14:23    Scheduled Meds: . acetylcysteine  4 mL Nebulization Q4H while awake  . alfuzosin  10 mg Oral Daily  . allopurinol  300 mg Oral Daily  . ampicillin-sulbactam (UNASYN) IV  3 g Intravenous Q6H  . aspirin  300 mg Rectal Daily  . aspirin  325 mg Per Tube Daily  . atorvastatin  40 mg Oral q1800  . chlorhexidine  15 mL Mouth Rinse BID  . digoxin  0.125 mg Oral Daily  . enoxaparin (LOVENOX) injection  40 mg Subcutaneous Q24H  . levothyroxine  100 mcg Oral QAC breakfast  . mouth rinse  15 mL Mouth Rinse q12n4p  . metoprolol tartrate  25 mg Oral BID   Continuous Infusions: . feeding supplement (VITAL AF 1.2 CAL) 1,000 mL (01/16/16 2314)     LOS: 4 days    Time spent: > 35 minutes   Penny PiaVEGA, Tegh Franek, MD [facility] Pager [phone]  If 7PM-7AM, please contact night-coverage [email or web address] Password [email or web address] 01/17/2016, 11:07 AM

## 2016-01-17 NOTE — Progress Notes (Signed)
Pt. transferred to 4E-14 via bed from ; little drowsy/sleepy; able to answer some questions- some expressive aphasia; pt. oriented to room and call button.

## 2016-01-17 NOTE — Progress Notes (Signed)
STROKE TEAM PROGRESS NOTE   SUBJECTIVE (INTERVAL HISTORY) No family is at bedside. Patient was moved to room yesterday late in te evening and is more lethargic today per wife, still following commands. Off BiPAP and on [place], tolerating well. No respiratory distress but is coughing. Still has a lot of mucous at throat. On NT suctioning and mucomyst neb.  OBJECTIVE Temp:  [98 F (36.7 C)-99.5 F (37.5 C)] 98.9 F (37.2 C) (01/06 0442) Pulse Rate:  [86-96] 96 (01/06 0442) Cardiac Rhythm: Atrial fibrillation (01/06 0249) Resp:  [20-26] 25 (01/06 0442) BP: (163-221)/(83-111) 171/92 (01/06 0442) SpO2:  [95 %-100 %] 98 % (01/06 0748) Weight:  [85.6 kg (188 lb 11.4 oz)] 85.6 kg (188 lb 11.4 oz) (01/06 0442)  CBC:   Recent Labs Lab Feb 02, 2016 0350 February 02, 2016 0356 01/16/16 0439  WBC 8.8  --  12.7*  NEUTROABS 5.1  --   --   HGB 14.4 14.3 12.9*  HCT 42.4 42.0 37.7*  MCV 97.7  --  97.9  PLT 216  --  191    Basic Metabolic Panel:   Recent Labs Lab 01/14/16 0850  01/16/16 0439 01/16/16 1617  NA 138  --  143  --   K 4.1  --  3.3*  --   CL 106  --  111  --   CO2 22  --  25  --   GLUCOSE 172*  --  172*  --   BUN 13  --  23*  --   CREATININE 0.98  --  0.82  --   CALCIUM 9.0  --  8.7*  --   MG 1.9  < > 2.3 2.3  PHOS  --   < > 1.7* 1.3*  < > = values in this interval not displayed.  Lipid Panel:     Component Value Date/Time   CHOL 139 02/02/16 0730   TRIG 94 February 02, 2016 0730   HDL 51 02/02/2016 0730   CHOLHDL 2.7 02-02-2016 0730   VLDL 19 02-02-2016 0730   LDLCALC 69 02-02-2016 0730   HgbA1c:  Lab Results  Component Value Date   HGBA1C 6.6 (H) 02/02/16   Urine Drug Screen: No results found for: LABOPIA, COCAINSCRNUR, LABBENZ, AMPHETMU, THCU, LABBARB    IMAGING I have personally reviewed the radiological images below and agree with the radiology interpretations.  Ct Head Code Stroke W/o Cm 2016/02/02 1. Acute multifocal small to moderate MCA territory infarct, no  hemorrhage.  Dense RIGHT middle cerebral artery consistent with thromboembolism.  2. ASPECTS is 7.    Ct Angio Head W Or Wo Contrast February 02, 2016 Emergent RIGHT M1 large vessel occlusion.  Moderate stenosis RIGHT P3 segment.    Ct Angio Neck W And/or Wo Contrast 02-02-2016 Atherosclerosis without hemodynamically significant stenosis or acute vascular process.    Ct Cervical Spine Wo Contrast February 02, 2016 No acute cervical spine fracture or malalignment.  Severe LEFT C3-4 through C5-6 neural foraminal narrowing.    Ct Cerebral Perfusion W Contrast 2016-02-02 Acute large RIGHT MCA territory infarct, with additional 72 cc penumbra/brain at risk.    Ct Head Wo Contrast 01/14/2016 Developing hypodensity in the right MCA territory compatible with acute infarct.  Similar territory to MRI yesterday.  Negative for hemorrhage.    Mr Brain Wo Contrast 02/02/16 1. Large evolving acute right MCA territory infarct as above. Associated petechial hemorrhage without frank hemorrhagic transformation. No significant mass effect at this time.  2. Underlying age-related cerebral atrophy with mild chronic microvascular ischemic disease.  Dg Chest Port 1 View 01/14/2016 Mild cardiac enlargement. No edema or consolidation. There is aortic atherosclerosis.    TTE  The patient was in atrial fibrillation.  Normal LV size with moderate LV hypertrophy.  EF 60-65%.  Mildly dilated RV with mildly decreased systolic function. Moderate pulmonary hypertension. Mild MR.    Ct Head Wo Contrast 01/15/2016 Evolving large RIGHT MCA territory infarct with petechial hemorrhage, no hemorrhagic conversion.  1-2 mm RIGHT to LEFT midline shift with slight RIGHT lateral ventricle effacement.  No hydrocephalus or entrapment.    Ct Angio Chest Pe W Or Wo Contrast 01/14/2016 No acute large central pulmonary embolus.  Cardiomegaly with coronary arteriosclerosis and aortic atherosclerosis.  Small bilateral pleural  effusions with compressive atelectasis.  Right upper lobe granuloma.    Dg Chest Port 1 View 01/15/2016 1. Interval development of bibasilar right greater than left opacification which may be due to atelectasis or infiltrate  2. Stable cardiomegaly.    PHYSICAL EXAM  Temp:  [98 F (36.7 C)-99.5 F (37.5 C)] 98.9 F (37.2 C) (01/06 0442) Pulse Rate:  [86-96] 96 (01/06 0442) Resp:  [20-26] 25 (01/06 0442) BP: (163-221)/(83-111) 171/92 (01/06 0442) SpO2:  [95 %-100 %] 98 % (01/06 0748) Weight:  [85.6 kg (188 lb 11.4 oz)] 85.6 kg (188 lb 11.4 oz) (01/06 0442)  General - Well nourished, well developed, in no acute respiratory distress.  Ophthalmologic - Fundi not visualized due to noncooperation.  Cardiovascular - irregularly irregular heart rate and rhythm.  Neuro - sleepy but arousable, orientated to year, place, self. Able to follow all peripheral and midline commands. Eyes right gaze preference, but able to cross over midline. PERRL. Left facial droop. Severe dysarthria. Spontaneous movement at RUE and RLE, 4/5 LUE proximal and 3/5 distal, and 3/5 LLE. Bulk was normal and fasciculations were absent. Muscle tone was normal. DTR 1+ in all extremities and he had no pathological reflexes. Sensation, coordination and gait not cooperative.   ASSESSMENT/PLAN [name] is a 83 y.o. male with history of AFib on xarelto, HTN, HLD, hypothyroidism and gout presenting with  L side weakness and facial droop. He did not receive IV t-PA.   Stroke:  Non-dominant right MCA large infarct embolic most likely source secondary to known atrial fibrillation   Resultant  left hemiparesis and left neglect  Code Stroke CT  R MCA territory infarcts. Dense R MCA  CTA head  Emergent R M1 large vessel occlusion, moderate R P3 stenosis  CTA neck  Atherosclerosis w/o hemodynamic stenosis  CTP  Large R MCA core infarct, and small penumbra   MRI  Large R MCA infarct but no midline  shift  Repeat CT - evolving right MCA infarct and no hemorrhage  2D Echo  EF 60-65%   LDL 69  HgbA1c 6.6  lovenox for VTE prophylaxis  Xarelto (rivaroxaban) daily prior to admission, now on aspirin 300 mg suppository daily due to large infarct.   Consider to switch to eliquis in one week.   Patient counseled to be compliant with his antithrombotic medications  Ongoing aggressive stroke risk factor management  Therapy recommendations: Pt not appropriate for PT due to respiratory status - Recently made DNR.  Disposition:  pending    Respiratory distress, resolved  CXR right greater than left opacification, concerning for aspiration  On Unasyn - 1st dose Thursday 01/29/2016  ABG improved on BiPAP  CTA chest ruled out PE  Now on [place], tolerating well  Mucomyst nebulization  NT suctioning  Atrial Fibrillation  Home anticoagulation:  Xarelto (rivaroxaban) daily   Failed Xarelto, now on aspirin 300 supp given large infarct  Consider to switch to eliquis in one week  Rate controlled now  On metoprolol 25mg  bid  Resume digoxin    Hypertension  Stable Permissive hypertension (OK if < 220/120) but gradually normalize in 5-7 days  On metoprolol Long-term BP goal normotensive  Hyperlipidemia  Home meds:  lipitor 40 and lovaza  Resumed lipitor 40  LDL 69, goal < 70  Continue statin at discharge  Other Stroke Risk Factors  Advanced age  Family hx stroke (father)  Other Active Problems  Hypothyroidism - on synthroid  Leukocytosis - 12.7 - most likely secondary to respiratory status.- repeat labs in a.m.  Hypokalemia - 3.3 - repeat labs in a.m.  Hospital day # 4   Personally examined patient and images, and have participated in and made any corrections needed to history, physical, neuro exam,assessment and plan as stated above.  I have personally obtained the history, evaluated lab date, reviewed imaging studies and agree with radiology  interpretations. Stroke team will sign off at this time.   Naomie DeanAntonia Ahern, MD Stroke Neurology      To contact Stroke Continuity provider, please refer to [email or web address]. After hours, contact General Neurology

## 2016-01-17 NOTE — Progress Notes (Signed)
MD sticky note left in EPIC chart: 1/6-07/2016 (nights) - MD, please change order for BIPAP from continuous to PRN. Patient stable O2 sats on 3LNC.

## 2016-01-17 NOTE — Progress Notes (Signed)
Speech Language Pathology Treatment: Dysphagia  Patient Details Name: Jerry [name] MRN: [id number] DOB: 02/23/1931 Today's Date: 01/17/2016 Time: 1001-1015 SLP Time Calculation (min) (ACUTE ONLY): 14 min  Assessment / Plan / Recommendation Clinical Impression  Skilled treatment with oral care provided to elicit swallow response/overall oral sensory awareness and increase pt LOA/preparation for po intake with minimal change in lethargy despite maximum visual/tactile cueing from SLP; pt was oriented to name/place when SLP arrived for tx, but pt's LOA decreased prior to intake of po's after oral care provided; recommend continue NPO status at this time; ST will continue to f/u for possible po readiness; cog/language tx as appropriate.   HPI HPI: Pt presents with R Ischemic MCA Infarct. Pt with hx of A-fib, HTN, and Gout; CT head on 01/15/15 indicated Evolving large RIGHT MCA territory infarct with petechial hemorrhage, no hemorrhagic conversion; 1-2 mm RIGHT to LEFT midline shift with slight RIGHT lateral ventricle effacement, no hydrocephalus or entrapment.      SLP Plan  Continue with current plan of care     Recommendations  Diet recommendations: NPO Medication Administration: Via alternative means                General recommendations: Other(comment) (TBD) Oral Care Recommendations: Oral care QID Plan: Continue with current plan of care                      ADAMS,PAT, M.S., CCC-SLP 01/17/2016, 2:54 PM

## 2016-01-17 NOTE — Progress Notes (Signed)
MD rounded on patient, RN told Md about HR tachy, up to 140s, patient currently in 3880s . RN also told MD [place] tube clogged, MD gave RN verbal approval to insert new tube if unable to de-clog. Speech at bedside, oral care provided, patient drowsy, unable to follow commands to pass speech eval. RN attempted to de-clog with Coke, and very warm water, unsuccessful. RN paging Cortrak team.

## 2016-01-17 NOTE — Progress Notes (Signed)
RN paged [facility] trak Dietician to come look at tube, patient has mitts on, but wife called out to say patient got hold of tube and pulled. RN went in and tape was down from nose and tube was out some. Wife stated that she attempted to re-insert tube, RN told wife, to please not do that for any future occurences because patients TF was not held, and TF could aspirate into patients lungs. Heather coming to assess tube, RN holding tube feeds at this time

## 2016-01-17 NOTE — Progress Notes (Signed)
BS 180, MD paged. Patient not on insulin but CBG checks. Pt getting Tube feeding.

## 2016-01-18 ENCOUNTER — Inpatient Hospital Stay (HOSPITAL_COMMUNITY): Payer: Medicare Other

## 2016-01-18 LAB — BASIC METABOLIC PANEL
Anion gap: 10 (ref 5–15)
Anion gap: 8 (ref 5–15)
BUN: 19 mg/dL (ref 6–20)
BUN: 18 mg/dL (ref 6–20)
CALCIUM: 8.4 mg/dL — AB (ref 8.9–10.3)
CALCIUM: 8.5 mg/dL — AB (ref 8.9–10.3)
CHLORIDE: 106 mmol/L (ref 101–111)
CHLORIDE: 109 mmol/L (ref 101–111)
CO2: 27 mmol/L (ref 22–32)
CO2: 24 mmol/L (ref 22–32)
CREATININE: 0.66 mg/dL (ref 0.61–1.24)
Creatinine, Ser: 0.67 mg/dL (ref 0.61–1.24)
GFR calc Af Amer: >60 mL/min (ref >60)
GFR calc non Af Amer: >60 mL/min (ref >60)
GLUCOSE: 167 mg/dL — AB (ref 65–99)
Glucose, Bld: 162 mg/dL — ABNORMAL HIGH (ref 65–99)
Potassium: 3 mmol/L — ABNORMAL LOW (ref 3.5–5.1)
Potassium: 3.8 mmol/L (ref 3.5–5.1)
SODIUM: 144 mmol/L (ref 135–145)
Sodium: 140 mmol/L (ref 135–145)

## 2016-01-18 LAB — CBC
HCT: 40.1 % (ref 39.0–52.0)
HEMOGLOBIN: 13.5 g/dL (ref 13.0–17.0)
MCH: 33.3 pg (ref 26.0–34.0)
MCHC: 33.7 g/dL (ref 30.0–36.0)
MCV: 99 fL (ref 78.0–100.0)
PLATELETS: 230 10*3/uL (ref 150–400)
RBC: 4.05 MIL/uL — ABNORMAL LOW (ref 4.22–5.81)
RDW: 14.7 % (ref 11.5–15.5)
WBC: 13.8 10*3/uL — ABNORMAL HIGH (ref 4.0–10.5)

## 2016-01-18 LAB — URINALYSIS, ROUTINE W REFLEX MICROSCOPIC
BILIRUBIN URINE: NEGATIVE
Bacteria, UA: NONE SEEN
Glucose, UA: 50 mg/dL — AB
Hgb urine dipstick: NEGATIVE
Ketones, ur: NEGATIVE mg/dL
Leukocytes, UA: NEGATIVE
Nitrite: NEGATIVE
PH: 6 (ref 5.0–8.0)
Protein, ur: 100 mg/dL — AB
SPECIFIC GRAVITY, URINE: 1.027 (ref 1.005–1.030)
SQUAMOUS EPITHELIAL / LPF: NONE SEEN

## 2016-01-18 LAB — GLUCOSE, CAPILLARY
GLUCOSE-CAPILLARY: 161 mg/dL — AB (ref 65–99)
Glucose-Capillary: 134 mg/dL — ABNORMAL HIGH (ref 65–99)
Glucose-Capillary: 141 mg/dL — ABNORMAL HIGH (ref 65–99)
Glucose-Capillary: 146 mg/dL — ABNORMAL HIGH (ref 65–99)
Glucose-Capillary: 170 mg/dL — ABNORMAL HIGH (ref 65–99)

## 2016-01-18 LAB — LACTIC ACID, PLASMA: LACTIC ACID, VENOUS: 1.3 mmol/L (ref 0.5–1.9)

## 2016-01-18 LAB — TSH: TSH: 2.191 u[IU]/mL (ref 0.350–4.500)

## 2016-01-18 MED ORDER — LEVALBUTEROL HCL 0.63 MG/3ML IN NEBU: 0.6300 mg | INHALATION_SOLUTION | Freq: Four times a day (QID) | RESPIRATORY_TRACT | Status: DC | PRN

## 2016-01-18 MED ORDER — METOPROLOL TARTRATE 5 MG/5ML IV SOLN
2.5000 mg | Freq: Once | INTRAVENOUS | Status: AC
  Administered 2016-01-18: 2.5 mg via INTRAVENOUS
  Filled 2016-01-18: qty 5

## 2016-01-18 MED ORDER — METOPROLOL TARTRATE 5 MG/5ML IV SOLN
5.0000 mg | Freq: Once | INTRAVENOUS | Status: AC
  Administered 2016-01-18: 5 mg via INTRAVENOUS
  Filled 2016-01-18: qty 5

## 2016-01-18 MED ORDER — METOPROLOL TARTRATE 5 MG/5ML IV SOLN
5.0000 mg | Freq: Four times a day (QID) | INTRAVENOUS | Status: DC
  Administered 2016-01-18 – 2016-01-20 (×11): 5 mg via INTRAVENOUS
  Filled 2016-01-18 (×12): qty 5

## 2016-01-18 MED ORDER — SODIUM CHLORIDE 0.9 % IV SOLN
30.0000 meq | Freq: Once | INTRAVENOUS | Status: AC
  Administered 2016-01-18: 30 meq via INTRAVENOUS
  Filled 2016-01-18: qty 15

## 2016-01-18 MED ORDER — PIPERACILLIN-TAZOBACTAM 3.375 G IVPB
3.3750 g | Freq: Three times a day (TID) | INTRAVENOUS | Status: DC
  Administered 2016-01-18 – 2016-01-25 (×21): 3.375 g via INTRAVENOUS
  Filled 2016-01-18 (×25): qty 50

## 2016-01-18 MED ORDER — VANCOMYCIN HCL IN DEXTROSE 1-5 GM/200ML-% IV SOLN
1000.0000 mg | Freq: Two times a day (BID) | INTRAVENOUS | Status: DC
  Administered 2016-01-18 – 2016-01-21 (×7): 1000 mg via INTRAVENOUS
  Filled 2016-01-18 (×7): qty 200

## 2016-01-18 MED ORDER — METOPROLOL TARTRATE 5 MG/5ML IV SOLN
INTRAVENOUS | Status: AC
  Administered 2016-01-18: 5 mg
  Filled 2016-01-18: qty 5

## 2016-01-18 MED ORDER — METOPROLOL TARTRATE 5 MG/5ML IV SOLN: 5.0000 mg | Freq: Once | INTRAVENOUS | Status: AC

## 2016-01-18 MED ORDER — SODIUM CHLORIDE 0.9 % IV SOLN
INTRAVENOUS | Status: DC
  Administered 2016-01-18 – 2016-01-21 (×6): via INTRAVENOUS

## 2016-01-18 NOTE — Progress Notes (Signed)
PROGRESS NOTE    Lorna FewHarold D Olsen  NWG:[id number] DOB: 1931/12/29 DOA: 02/11/2016 PCP: Ezequiel KayserPERINI,MARK A, MD   Brief Narrative:  83 y.o. male who presents with acute onset of left facial droop with LUE and LLE weakness. Pt with R MCA stroke and Neurology on board.   Assessment & Plan:   Principal Problem:   Acute ischemic right MCA stroke [facility](HCC) - Neurology on board and assisting - Pt with depressed mental status, repeat CT scan performed and reported: Changes consistent with evolving right middle cerebral artery infarct. No significant hemorrhagic component is noted. Previously seen petechial hemorrhages are again noted.  Active Problems:   ATRIAL FIBRILLATION - Pt on aspirin - rate controlled of rate controlling agent on board    Essential hypertension - allow for permissive hypertension  Tachypnea - Pt is on intermittent bipap. He is DNI/DNR.  - Continue step down monitoring for now. - Pt at risk for aspiration, place on aspiration precautions - PNA suspected on last x ray. Agree with zosyn and vancomycin for now  Hypokalemia - replacing enterally. Pt has ng tube in place.    Hypothyroidism - stable continue synthroid    Hyperlipidemia - stable   DVT prophylaxis: Lovenox Code Status: Full Family Communication: none at bedside Disposition Plan: pending improvement in condition   Consultants:   Neurology   Procedures: None   Antimicrobials: None  Subjective: Pt resting in bed with increased respiratory rate  Objective: Vitals:   01/18/16 0558 01/18/16 0600 01/18/16 0634 01/18/16 0752  BP: (!) 170/127 (!) 169/113    Pulse: (!) 112 (!) 129 (!) 103   Resp: (!) 30 (!) 29 (!) 29   Temp:      TempSrc:      SpO2: 96% 98% 98% 97%  Weight:      Height:        Intake/Output Summary (Last 24 hours) at 01/18/16 0830 Last data filed at 01/18/16 0600  Gross per 24 hour  Intake          2346.01 ml  Output             1200 ml  Net          1146.01 ml    Filed Weights   01/16/16 0600 01/17/16 0442 01/18/16 0332  Weight: 85.7 kg (188 lb 15 oz) 85.6 kg (188 lb 11.4 oz) 84.1 kg (185 lb 6.5 oz)    Examination:  General exam: in bed resting with increased RR. Does not interact with examiner this am. Is spontaneously moving extremities Respiratory system: elevated RR no accessory muscle use, no wheezes, equal chest rise. + rhales Cardiovascular system: S1 & S2 heard, IRR. No  rubs Gastrointestinal system: Abdomen is nondistended, soft and nontender. No organomegaly or masses felt. Normal bowel sounds heard. Central nervous system: L sided weakness, facial asymmetry Extremities: no cyanosis, no clubbing Skin: No rashes, lesions or ulcers Psychiatry: unable to assess due to limited pt cooperation  Data Reviewed: I have personally reviewed following labs and imaging studies  CBC:  Recent Labs Lab 09-17-2016 0350 09-17-2016 0356 01/16/16 0439 01/18/16 0510  WBC 8.8  --  12.7* 13.8*  NEUTROABS 5.1  --   --   --   HGB 14.4 14.3 12.9* 13.5  HCT 42.4 42.0 37.7* 40.1  MCV 97.7  --  97.9 99.0  PLT 216  --  191 230   Basic Metabolic Panel:  Recent Labs Lab 09-17-2016 0350 09-17-2016 0356 01/14/16 0850 01/15/16 1405 01/15/16 1656  01/16/16 0439 01/16/16 1617 01/17/16 0000 01/18/16 0510  NA 138 140 138  --   --  143  --  140 144  K 3.9 3.8 4.1  --   --  3.3*  --  3.0* 3.8  CL 103 104 106  --   --  111  --  106 109  CO2 26  --  22  --   --  25  --  24 27  GLUCOSE 155* 151* 172*  --   --  172*  --  167* 162*  BUN 9 12 13   --   --  23*  --  19 18  CREATININE 0.92 0.90 0.98  --   --  0.82  --  0.66 0.67  CALCIUM 9.5  --  9.0  --   --  8.7*  --  8.4* 8.5*  MG  --   --  1.9 2.2 2.4 2.3 2.3  --   --   PHOS  --   --   --  3.0 2.8 1.7* 1.3*  --   --    GFR: Estimated Creatinine Clearance: 80.5 mL/min (by C-G formula based on SCr of 0.67 mg/dL). Liver Function Tests:  Recent Labs Lab 01/14/2016 0350 01/14/16 0850  AST 34 34  ALT 34 28   ALKPHOS 77 58  BILITOT 1.5* 3.0*  PROT 7.2 6.6  ALBUMIN 4.1 3.6   No results for input(s): LIPASE, AMYLASE in the last 168 hours. No results for input(s): AMMONIA in the last 168 hours. Coagulation Profile:  Recent Labs Lab 02/04/2016 0350  INR 1.89   Cardiac Enzymes:  Recent Labs Lab 02/08/2016 0730  TROPONINI <0.03   BNP (last 3 results) No results for input(s): PROBNP in the last 8760 hours. HbA1C: No results for input(s): HGBA1C in the last 72 hours. CBG:  Recent Labs Lab 01/17/16 0846 01/17/16 1230 01/17/16 1615 01/17/16 2111 01/18/16 0756  GLUCAP 172* 180* 150* 164* 161*   Lipid Profile: No results for input(s): CHOL, HDL, LDLCALC, TRIG, CHOLHDL, LDLDIRECT in the last 72 hours. Thyroid Function Tests: No results for input(s): TSH, T4TOTAL, FREET4, T3FREE, THYROIDAB in the last 72 hours. Anemia Panel: No results for input(s): VITAMINB12, FOLATE, FERRITIN, TIBC, IRON, RETICCTPCT in the last 72 hours. Sepsis Labs: No results for input(s): PROCALCITON, LATICACIDVEN in the last 168 hours.  Recent Results (from the past 240 hour(s))  MRSA PCR Screening     Status: None   Collection Time: 02/03/2016  7:09 AM  Result Value Ref Range Status   MRSA by PCR NEGATIVE NEGATIVE Final    Comment:        The GeneXpert MRSA Assay (FDA approved for NASAL specimens only), is one component of a comprehensive MRSA colonization surveillance program. It is not intended to diagnose MRSA infection nor to guide or monitor treatment for MRSA infections.   Culture, blood (Routine X 2) w Reflex to ID Panel     Status: None (Preliminary result)   Collection Time: 01/15/16  4:40 AM  Result Value Ref Range Status   Specimen Description BLOOD RIGHT ARM  Final   Special Requests IN PEDIATRIC BOTTLE 3CC  Final   Culture NO GROWTH 2 DAYS  Final   Report Status PENDING  Incomplete  Culture, blood (Routine X 2) w Reflex to ID Panel     Status: None (Preliminary result)   Collection  Time: 01/15/16  4:50 AM  Result Value Ref Range Status   Specimen Description BLOOD RIGHT  HAND  Final   Special Requests BOTTLES DRAWN AEROBIC ONLY 7CC  Final   Culture NO GROWTH 2 DAYS  Final   Report Status PENDING  Incomplete         Radiology Studies: Ct Head Wo Contrast  Result Date: 01/18/2016 CLINICAL DATA:  Increased altered mental status EXAM: CT HEAD WITHOUT CONTRAST TECHNIQUE: Contiguous axial images were obtained from the base of the skull through the vertex without intravenous contrast. COMPARISON:  01/15/2016 FINDINGS: Brain: There is evolving decreased attenuation in the distribution of the right middle cerebral artery consistent with the recent infarct. Persistent density from petechial hemorrhage is noted. No new definitive hemorrhagic component is noted. No significant midline shift is identified although some mass-effect upon the right lateral ventricle is noted. Vascular: Previously seen dense right middle cerebral artery has resolved in the interval. Skull: Normal. Negative for fracture or focal lesion. Sinuses/Orbits: No acute finding. Other: None. IMPRESSION: Changes consistent with evolving right middle cerebral artery infarct. No significant hemorrhagic component is noted. Previously seen petechial hemorrhages are again noted. Electronically Signed   By: Alcide Clever M.D.   On: 01/18/2016 07:44   Dg Chest Port 1 View  Result Date: 01/18/2016 CLINICAL DATA:  Fever EXAM: PORTABLE CHEST 1 VIEW COMPARISON:  01/14/2016 FINDINGS: Airspace opacity in the central and basilar right lung, probably with a small right pleural effusion. Left lung is clear. Pulmonary vasculature is normal. Unchanged mild cardiomegaly IMPRESSION: Opacity and effusion in the right base, likely pneumonia in the setting of fever. Followup PA and lateral chest X-ray is recommended in 3-4 weeks following trial of antibiotic therapy to ensure resolution and exclude underlying malignancy. Electronically Signed    By: Ellery Plunk M.D.   On: 01/18/2016 06:25   Dg Abd Portable 1v  Result Date: 01/17/2016 CLINICAL DATA:  Feeding tube placement. EXAM: PORTABLE ABDOMEN - 1 VIEW COMPARISON:  Single-view of the abdomen 01/15/2016. FINDINGS: Feeding tube tip is in the distal most stomach or first portion of the duodenum. IMPRESSION: As above. Electronically Signed   By: Drusilla Kanner M.D.   On: 01/17/2016 16:25    Scheduled Meds: . acetylcysteine  4 mL Nebulization Q4H while awake  . alfuzosin  10 mg Oral Daily  . allopurinol  300 mg Oral Daily  . aspirin  300 mg Rectal Daily  . aspirin  325 mg Per Tube Daily  . atorvastatin  40 mg Oral q1800  . chlorhexidine  15 mL Mouth Rinse BID  . digoxin  0.125 mg Oral Daily  . enoxaparin (LOVENOX) injection  40 mg Subcutaneous Q24H  . insulin aspart  0-9 Units Subcutaneous TID WC  . levothyroxine  100 mcg Oral QAC breakfast  . mouth rinse  15 mL Mouth Rinse q12n4p  . metoprolol  5 mg Intravenous Q6H  . piperacillin-tazobactam (ZOSYN)  IV  3.375 g Intravenous Q8H  . vancomycin  1,000 mg Intravenous Q12H   Continuous Infusions: . sodium chloride 50 mL/hr at 01/18/16 0332  . feeding supplement (VITAL AF 1.2 CAL) Stopped (01/18/16 0358)     LOS: 5 days    Time spent: > 35 minutes   Penny Pia, MD [facility] Pager [phone]  If 7PM-7AM, please contact night-coverage [email or web address] Password [email or web address] 01/18/2016, 8:30 AM

## 2016-01-18 NOTE — Progress Notes (Signed)
Patient has removed cortrak from nare. TF stopped at this time. Cortrak team will be notified during dayshift on 01/18/16.

## 2016-01-18 NOTE — Plan of Care (Signed)
Problem: Education: Goal: Knowledge of patient specific risk factors addressed and post discharge goals established will improve Outcome: Not Progressing Unable to assess patients understanding of teaching regarding atrial fibrillation due to current mental awareness state.

## 2016-01-18 NOTE — Progress Notes (Signed)
MD paged to request to see patient. Patient no longer states his name and HR, BP, and RR elevated again after previous interventions. MD to see patient.

## 2016-01-18 NOTE — Progress Notes (Signed)
Pharmacy Antibiotic Note  Lorna FewHarold D Ragin is a 83 y.o. male admitted on 01/20/2016 with weakness.  Pharmacy has been consulted for Vancomycin/Zosyn dosing for HCAP. Pt on Unasyn but now has fever and opacification on CXR. WBC also rising.   Plan: -Vancomycin 1000 mg IV q12h -Zosyn 3.375G IV q8h to be infused over 4 hours -Trend WBC, temp, renal function  -Drug levels as indicated   Height: 6\' 1"  (185.4 cm) Weight: 185 lb 6.5 oz (84.1 kg) IBW/kg (Calculated) : 79.9  Temp (24hrs), Avg:100 F (37.8 C), Min:99.6 F (37.6 C), Max:100.5 F (38.1 C)   Recent Labs Lab 02/10/2016 0350 01/27/2016 0356 01/14/16 0850 01/16/16 0439 01/17/16 0000 01/18/16 0510  WBC 8.8  --   --  12.7*  --  13.8*  CREATININE 0.92 0.90 0.98 0.82 0.66 0.67    Estimated Creatinine Clearance: 80.5 mL/min (by C-G formula based on SCr of 0.67 mg/dL).    Allergies  Allergen Reactions  . Cardizem [Diltiazem Hcl] Rash     Abran DukeLedford, Saloni Lablanc 01/18/2016 6:38 AM

## 2016-01-18 NOTE — Progress Notes (Addendum)
Patient HR frequently going as high as 166-177 afib, will settle after a few minutes then accelarate again. Patient lying in bed. RR 30-38. Lungs clear, diminished. Per RT, scheduled mucomyst must be given with beta-agonists so unable to change scheduled albuterol. K 3.0, new order received to replete via IVF. BP elevated but not high enough to meet requirements for PRN hydralazine. PRN morphine given for dyspnea at 0139 with some improvement. As of 0300, patient HR beginning to elevate again frequently into 150-170 sustaining for a minute or two at a time. MD made aware of patient current status. Will continue to monitor patient closely.  New orders received for NS @50ml /hr and one time 2.5mg  metoprolol.

## 2016-01-19 LAB — CBC WITH DIFFERENTIAL/PLATELET
BASOS ABS: 0 10*3/uL (ref 0.0–0.1)
Basophils Relative: 0 %
EOS ABS: 0 10*3/uL (ref 0.0–0.7)
EOS PCT: 0 %
HCT: 43.7 % (ref 39.0–52.0)
Hemoglobin: 14.7 g/dL (ref 13.0–17.0)
LYMPHS PCT: 8 %
Lymphs Abs: 1.5 10*3/uL (ref 0.7–4.0)
MCH: 33.9 pg (ref 26.0–34.0)
MCHC: 33.6 g/dL (ref 30.0–36.0)
MCV: 100.7 fL — AB (ref 78.0–100.0)
MONO ABS: 2.1 10*3/uL — AB (ref 0.1–1.0)
Monocytes Relative: 11 %
Neutro Abs: 15.5 10*3/uL — ABNORMAL HIGH (ref 1.7–7.7)
Neutrophils Relative %: 81 %
PLATELETS: 295 10*3/uL (ref 150–400)
RBC: 4.34 MIL/uL (ref 4.22–5.81)
RDW: 14.8 % (ref 11.5–15.5)
WBC: 19.1 10*3/uL — AB (ref 4.0–10.5)

## 2016-01-19 LAB — GLUCOSE, CAPILLARY
GLUCOSE-CAPILLARY: 156 mg/dL — AB (ref 65–99)
GLUCOSE-CAPILLARY: 182 mg/dL — AB (ref 65–99)
Glucose-Capillary: 150 mg/dL — ABNORMAL HIGH (ref 65–99)
Glucose-Capillary: 149 mg/dL — ABNORMAL HIGH (ref 65–99)
Glucose-Capillary: 166 mg/dL — ABNORMAL HIGH (ref 65–99)

## 2016-01-19 MED ORDER — AMIODARONE HCL IN DEXTROSE 360-4.14 MG/200ML-% IV SOLN
30.0000 mg/h | INTRAVENOUS | Status: DC
  Administered 2016-01-19 – 2016-01-22 (×4): 30 mg/h via INTRAVENOUS
  Filled 2016-01-19 (×6): qty 200

## 2016-01-19 MED ORDER — AMIODARONE LOAD VIA INFUSION
150.0000 mg | Freq: Once | INTRAVENOUS | Status: AC
  Administered 2016-01-19: 150 mg via INTRAVENOUS
  Filled 2016-01-19: qty 83.34

## 2016-01-19 MED ORDER — SODIUM CHLORIDE 0.9 % IV BOLUS (SEPSIS)
500.0000 mL | Freq: Once | INTRAVENOUS | Status: AC
  Administered 2016-01-19: 500 mL via INTRAVENOUS

## 2016-01-19 MED ORDER — AMIODARONE HCL IN DEXTROSE 360-4.14 MG/200ML-% IV SOLN
60.0000 mg/h | INTRAVENOUS | Status: AC
  Administered 2016-01-19: 60 mg/h via INTRAVENOUS
  Filled 2016-01-19: qty 200

## 2016-01-19 NOTE — Progress Notes (Signed)
PROGRESS NOTE    Jerry [name]  ZOX:[id number] DOB: Jun 03, 1931 DOA: 26-Jan-2016 PCP: Ezequiel Kayser, MD   Brief Narrative:  83 y.o. male who presents with acute onset of left facial droop with LUE and LLE weakness. Pt with R MCA stroke and Neurology on board.  Hospital stay has been complicated by dysphagia 2ary to stroke, aspiration, and atrial fibrillation. Prognosis seems poor and patient's conditions seems to be deteriorating. Will contact wife to discuss with her.   Assessment & Plan:   Principal Problem:   Acute ischemic right MCA stroke [facility]) - Neurology on board and assisting - Pt with depressed mental status, repeat CT scan performed and reported: Changes consistent with evolving right middle cerebral artery infarct. No significant hemorrhagic component is noted. Previously seen petechial hemorrhages are again noted.  Active Problems:   ATRIAL FIBRILLATION - Pt on aspirin - On B blocker. Allergic to Cardizem - Place on amiodarone bolus and infusion - fluid bolus. Suspect infection is contributing to rvr  Hypotension - unable to administer B blocker - Fluid bolus    Essential hypertension - allow for permissive hypertension  Tachypnea - Pt is on intermittent bipap. He is DNI/DNR.  - suspect secondary to aspiration and   Hypokalemia - replacing enterally. Pt has ng tube in place.    Hypothyroidism - stable continue synthroid    Hyperlipidemia - stable   DVT prophylaxis: Lovenox Code Status: Full Family Communication: unable to reach wife via phone left VM Disposition Plan: pending improvement in condition   Consultants:   Neurology   Procedures: None   Antimicrobials: None  Subjective: Pt has had increased respiratory effort with depressed level of consciousness. Unable to reach spouse via phone  Objective: Vitals:   01/19/16 0527 01/19/16 0610 01/19/16 0700 01/19/16 0925  BP:  (!) 174/99 (!) 158/95 (!) 74/52  Pulse:   (!) 132 (!) 128    Resp:   (!) 44 (!) 43  Temp:   98.4 F (36.9 C)   TempSrc:   Axillary   SpO2:   97% 96%  Weight: 83.4 kg (183 lb 13.8 oz)     Height:        Intake/Output Summary (Last 24 hours) at 01/19/16 0928 Last data filed at 01/19/16 0905  Gross per 24 hour  Intake             1650 ml  Output             1100 ml  Net              550 ml   Filed Weights   01/17/16 0442 01/18/16 0332 01/19/16 0527  Weight: 85.6 kg (188 lb 11.4 oz) 84.1 kg (185 lb 6.5 oz) 83.4 kg (183 lb 13.8 oz)    Examination:  General exam: in bed resting with increased RR. On Bipap Respiratory system: elevated RR on bipap, rhales Cardiovascular system: Afib with RVR no rubs Gastrointestinal system: Abdomen is nondistended, soft and nontender. No organomegaly or masses felt. Normal bowel sounds heard. Central nervous system: L sided weakness, facial asymmetry Extremities: right toe cyanosis, no clubbing Skin: No rashes, lesions or ulcers, on limited exam. Psychiatry: unable to assess due to depressed consciousness  Data Reviewed: I have personally reviewed following labs and imaging studies  CBC:  Recent Labs Lab 01/26/16 0350 26-Jan-2016 0356 01/16/16 0439 01/18/16 0510 01/19/16 0149  WBC 8.8  --  12.7* 13.8* 19.1*  NEUTROABS 5.1  --   --   --  15.5*  HGB 14.4 14.3 12.9* 13.5 14.7  HCT 42.4 42.0 37.7* 40.1 43.7  MCV 97.7  --  97.9 99.0 100.7*  PLT 216  --  191 230 295   Basic Metabolic Panel:  Recent Labs Lab 02/05/2016 0350 02/05/2016 0356 01/14/16 0850 01/15/16 1405 01/15/16 1656 01/16/16 0439 01/16/16 1617 01/17/16 0000 01/18/16 0510  NA 138 140 138  --   --  143  --  140 144  K 3.9 3.8 4.1  --   --  3.3*  --  3.0* 3.8  CL 103 104 106  --   --  111  --  106 109  CO2 26  --  22  --   --  25  --  24 27  GLUCOSE 155* 151* 172*  --   --  172*  --  167* 162*  BUN 9 12 13   --   --  23*  --  19 18  CREATININE 0.92 0.90 0.98  --   --  0.82  --  0.66 0.67  CALCIUM 9.5  --  9.0  --   --  8.7*  --   8.4* 8.5*  MG  --   --  1.9 2.2 2.4 2.3 2.3  --   --   PHOS  --   --   --  3.0 2.8 1.7* 1.3*  --   --    GFR: Estimated Creatinine Clearance: 80.5 mL/min (by C-G formula based on SCr of 0.67 mg/dL). Liver Function Tests:  Recent Labs Lab 02/03/2016 0350 01/14/16 0850  AST 34 34  ALT 34 28  ALKPHOS 77 58  BILITOT 1.5* 3.0*  PROT 7.2 6.6  ALBUMIN 4.1 3.6   No results for input(s): LIPASE, AMYLASE in the last 168 hours. No results for input(s): AMMONIA in the last 168 hours. Coagulation Profile:  Recent Labs Lab 02/06/2016 0350  INR 1.89   Cardiac Enzymes:  Recent Labs Lab 01/16/2016 0730  TROPONINI <0.03   BNP (last 3 results) No results for input(s): PROBNP in the last 8760 hours. HbA1C: No results for input(s): HGBA1C in the last 72 hours. CBG:  Recent Labs Lab 01/18/16 1740 01/18/16 2003 01/18/16 2344 01/19/16 0537 01/19/16 0831  GLUCAP 134* 141* 146* 156* 150*   Lipid Profile: No results for input(s): CHOL, HDL, LDLCALC, TRIG, CHOLHDL, LDLDIRECT in the last 72 hours. Thyroid Function Tests:  Recent Labs  01/18/16 0704  TSH 2.191   Anemia Panel: No results for input(s): VITAMINB12, FOLATE, FERRITIN, TIBC, IRON, RETICCTPCT in the last 72 hours. Sepsis Labs:  Recent Labs Lab 01/18/16 2956  LATICACIDVEN 1.3    Recent Results (from the past 240 hour(s))  MRSA PCR Screening     Status: None   Collection Time: 01/15/2016  7:09 AM  Result Value Ref Range Status   MRSA by PCR NEGATIVE NEGATIVE Final    Comment:        The GeneXpert MRSA Assay (FDA approved for NASAL specimens only), is one component of a comprehensive MRSA colonization surveillance program. It is not intended to diagnose MRSA infection nor to guide or monitor treatment for MRSA infections.   Culture, blood (Routine X 2) w Reflex to ID Panel     Status: None (Preliminary result)   Collection Time: 01/15/16  4:40 AM  Result Value Ref Range Status   Specimen Description BLOOD  RIGHT ARM  Final   Special Requests IN PEDIATRIC BOTTLE 3CC  Final   Culture NO GROWTH 3  DAYS  Final   Report Status PENDING  Incomplete  Culture, blood (Routine X 2) w Reflex to ID Panel     Status: None (Preliminary result)   Collection Time: 01/15/16  4:50 AM  Result Value Ref Range Status   Specimen Description BLOOD RIGHT HAND  Final   Special Requests BOTTLES DRAWN AEROBIC ONLY 7CC  Final   Culture NO GROWTH 3 DAYS  Final   Report Status PENDING  Incomplete         Radiology Studies: Ct Head Wo Contrast  Result Date: 01/18/2016 CLINICAL DATA:  Increased altered mental status EXAM: CT HEAD WITHOUT CONTRAST TECHNIQUE: Contiguous axial images were obtained from the base of the skull through the vertex without intravenous contrast. COMPARISON:  01/15/2016 FINDINGS: Brain: There is evolving decreased attenuation in the distribution of the right middle cerebral artery consistent with the recent infarct. Persistent density from petechial hemorrhage is noted. No new definitive hemorrhagic component is noted. No significant midline shift is identified although some mass-effect upon the right lateral ventricle is noted. Vascular: Previously seen dense right middle cerebral artery has resolved in the interval. Skull: Normal. Negative for fracture or focal lesion. Sinuses/Orbits: No acute finding. Other: None. IMPRESSION: Changes consistent with evolving right middle cerebral artery infarct. No significant hemorrhagic component is noted. Previously seen petechial hemorrhages are again noted. Electronically Signed   By: Alcide Clever M.D.   On: 01/18/2016 07:44   Dg Chest Port 1 View  Result Date: 01/18/2016 CLINICAL DATA:  Fever EXAM: PORTABLE CHEST 1 VIEW COMPARISON:  01/14/2016 FINDINGS: Airspace opacity in the central and basilar right lung, probably with a small right pleural effusion. Left lung is clear. Pulmonary vasculature is normal. Unchanged mild cardiomegaly IMPRESSION: Opacity and effusion  in the right base, likely pneumonia in the setting of fever. Followup PA and lateral chest X-ray is recommended in 3-4 weeks following trial of antibiotic therapy to ensure resolution and exclude underlying malignancy. Electronically Signed   By: Ellery Plunk M.D.   On: 01/18/2016 06:25   Dg Abd Portable 1v  Result Date: 01/17/2016 CLINICAL DATA:  Feeding tube placement. EXAM: PORTABLE ABDOMEN - 1 VIEW COMPARISON:  Single-view of the abdomen 01/15/2016. FINDINGS: Feeding tube tip is in the distal most stomach or first portion of the duodenum. IMPRESSION: As above. Electronically Signed   By: Drusilla Kanner M.D.   On: 01/17/2016 16:25    Scheduled Meds: . alfuzosin  10 mg Oral Daily  . allopurinol  300 mg Oral Daily  . amiodarone  150 mg Intravenous Once  . aspirin  300 mg Rectal Daily  . aspirin  325 mg Per Tube Daily  . atorvastatin  40 mg Oral q1800  . chlorhexidine  15 mL Mouth Rinse BID  . digoxin  0.125 mg Oral Daily  . enoxaparin (LOVENOX) injection  40 mg Subcutaneous Q24H  . insulin aspart  0-9 Units Subcutaneous TID WC  . levothyroxine  100 mcg Oral QAC breakfast  . mouth rinse  15 mL Mouth Rinse q12n4p  . metoprolol  5 mg Intravenous Q6H  . piperacillin-tazobactam (ZOSYN)  IV  3.375 g Intravenous Q8H  . sodium chloride  500 mL Intravenous Once  . vancomycin  1,000 mg Intravenous Q12H   Continuous Infusions: . sodium chloride 50 mL/hr at 01/18/16 2359  . amiodarone     Followed by  . amiodarone    . feeding supplement (VITAL AF 1.2 CAL) Stopped (01/18/16 0358)     LOS: 6 days  Time spent: > 35 minutes   Penny PiaVEGA, Mallorey Odonell, MD [facility] Pager [phone]  If 7PM-7AM, please contact night-coverage [email or web address] Password [email or web address] 01/19/2016, 9:28 AM

## 2016-01-19 NOTE — Progress Notes (Signed)
Respiratory called to place pt on bipap. Will see if this will make pt more comfortable since pt is unable to communicate. Will continue to monitor.

## 2016-01-19 NOTE — Progress Notes (Signed)
Pt placed on NIV for increase WOB. Pt is on NIV at this time tolerating it well.

## 2016-01-19 NOTE — Progress Notes (Addendum)
Pt on Bipap sats dropped to 70's increased FIO2 to 80 %. HR 114 - BP 142/83 RR at 39 Call made to Resp therapy.

## 2016-01-19 NOTE — Progress Notes (Addendum)
Sats still low to mid 80"s increased FIO2 on Bipap to 100 % sat at 90 % no response from patient Pupils at a "4" and nonreactive Pulled up in bed mask readjusted RR 40 HR 103 with bursts of up to 140"s Text page to Dr Cena BentonVega to make aware BP 77/48 Orders received for fluid bolus

## 2016-01-19 NOTE — Progress Notes (Signed)
Dr Cena BentonVega here examined pt - states he will call the family & give them an update

## 2016-01-19 NOTE — Progress Notes (Signed)
SLP Cancellation Note  Patient Details Name: Jerry [name] MRN: [id number] DOB: 01-Sep-1931   Cancelled treatment:       Reason Eval/Treat Not Completed: Medical issues which prohibited therapy. SLP signing off. Please re consult if patient becomes appropriate.   Ferdinand LangoLeah Norell Brisbin MA, CCC-SLP [phone]    Ferdinand LangoMcCoy Jerry [name] 01/19/2016, 11:51 AM

## 2016-01-19 NOTE — Progress Notes (Signed)
PT Cancellation Note  Patient Details Name: Jerry [name] MRN: [id number] DOB: August 16, 1931   Cancelled Treatment:    Reason Eval/Treat Not Completed: Medical issues which prohibited therapy   [name] 01/19/2016, 3:39 PM

## 2016-01-20 LAB — CULTURE, BLOOD (ROUTINE X 2)
CULTURE: NO GROWTH
Culture: NO GROWTH

## 2016-01-20 LAB — GLUCOSE, CAPILLARY
GLUCOSE-CAPILLARY: 117 mg/dL — AB (ref 65–99)
GLUCOSE-CAPILLARY: 134 mg/dL — AB (ref 65–99)
GLUCOSE-CAPILLARY: 162 mg/dL — AB (ref 65–99)
Glucose-Capillary: 147 mg/dL — ABNORMAL HIGH (ref 65–99)
Glucose-Capillary: 128 mg/dL — ABNORMAL HIGH (ref 65–99)
Glucose-Capillary: 127 mg/dL — ABNORMAL HIGH (ref 65–99)

## 2016-01-20 MED ORDER — SODIUM CHLORIDE 0.9 % IV BOLUS (SEPSIS)
500.0000 mL | Freq: Once | INTRAVENOUS | Status: AC
  Administered 2016-01-20: 500 mL via INTRAVENOUS

## 2016-01-20 MED ORDER — METOPROLOL TARTRATE 5 MG/5ML IV SOLN
10.0000 mg | Freq: Four times a day (QID) | INTRAVENOUS | Status: DC
  Administered 2016-01-20 – 2016-01-22 (×8): 10 mg via INTRAVENOUS
  Filled 2016-01-20 (×8): qty 10

## 2016-01-20 MED ORDER — METOPROLOL TARTRATE 5 MG/5ML IV SOLN: 5.0000 mg | Freq: Once | INTRAVENOUS | Status: DC

## 2016-01-20 NOTE — Progress Notes (Signed)
Physical Therapy Discharge  Patient Details Name: Jerry FewHarold D Piatek MRN: [id number] DOB: 1931-05-25   Cancelled Treatment:    Pt is not medically stable to participate in PT and hasn't been for a week. Per RN, pt on Bipap, with extremely high BP and is in rapid A-fib. Pt d/c'd from PT as pt no medically stable to participate in PT. Please re-order once pt medically stable to participate. Thank you.  Shantea Poulton M Dmonte Maher 01/20/2016, 10:31 AM   Lewis ShockAshly Shavar Gorka, PT, DPT Pager #: [phone] Office #: [phone]

## 2016-01-20 NOTE — Plan of Care (Signed)
Problem: Nutrition: Goal: Adequate nutrition will be maintained Outcome: Not Progressing Pt NPO for more than 48 hrs

## 2016-01-20 NOTE — Progress Notes (Signed)
Occupational Therapy Discharge Patient Details Name: Jerry FewHarold D [name] MRN: [id number] DOB: 12-14-1931 Today's Date: 01/20/2016 Time:  -     Patient discharged from OT services secondary to AFIB ( not medically ready for over week of attempts) please reorder when appropriate. .  Please see latest therapy progress note for current level of functioning and progress toward goals.    Progress and discharge plan discussed with patient and/or caregiver: Patient unable to participate in discharge planning and no caregivers available  GO     Jerry [name], Jerry [name]  [phone] 01/20/2016, 7:18 AM

## 2016-01-20 NOTE — Progress Notes (Signed)
PROGRESS NOTE    Jerry [name]  GNF:[id number] DOB: [birth year] DOA: 02-11-16 PCP: Ezequiel Kayser, MD   Brief Narrative:  83 y.o. male who presents with acute onset of left facial droop with LUE and LLE weakness. Pt with R MCA stroke and Neurology on board.  Hospital stay has been complicated by dysphagia 2ary to stroke, aspiration, and atrial fibrillation. Prognosis seems poor and patient's conditions seems to be deteriorating.   Assessment & Plan:   Principal Problem:   Acute ischemic right MCA stroke [facility]) - Neurology on board and assisting - Pt with depressed mental status, repeat CT scan performed and reported: Changes consistent with evolving right middle cerebral artery infarct. No significant hemorrhagic component is noted. Previously seen petechial hemorrhages are again noted.  Active Problems:   ATRIAL FIBRILLATION - Pt on aspirin - On B blocker. Allergic to Cardizem - Placed on amiodarone bolus and infusion, pharmacy assisting. - fluid bolus again - Lopressor 5 mg IV administered this am  For rate control  Hypotension - unable to administer B blocker - Fluid bolus    Essential hypertension - on B blocker  Tachypnea - Pt is on intermittent bipap. He is DNI/DNR.  - suspect secondary to aspiration and   Hypokalemia - replacing enterally. Pt has ng tube in place.    Hypothyroidism - stable continue synthroid    Hyperlipidemia - stable   DVT prophylaxis: Lovenox Code Status: Full Family Communication: unable to reach wife via phone left VM Disposition Plan: pending improvement in condition if no improvement in condition in the next 1-2 days with aggressive measures wife would like to speak to palliative.   Consultants:   Neurology   Procedures: None   Antimicrobials: None  Subjective: Pt has no new complaints.  Objective: Vitals:   01/20/16 0633 01/20/16 0749 01/20/16 1118 01/20/16 1201  BP:   (!) 178/91   Pulse: 77  (!) 139   Resp:  (!) 34  (!) 32   Temp:  98.6 F (37 C)  97.4 F (36.3 C)  TempSrc:  Axillary  Axillary  SpO2: 98%  99%   Weight:      Height:        Intake/Output Summary (Last 24 hours) at 01/20/16 1606 Last data filed at 01/20/16 0647  Gross per 24 hour  Intake          2103.03 ml  Output              225 ml  Net          1878.03 ml   Filed Weights   01/18/16 0332 01/19/16 0527 01/20/16 0411  Weight: 84.1 kg (185 lb 6.5 oz) 83.4 kg (183 lb 13.8 oz) 84 kg (185 lb 3 oz)    Examination:  General exam: in bed resting with increased RR. On Bipap Respiratory system: elevated RR on bipap, rhales Cardiovascular system: Afib with RVR no rubs Gastrointestinal system: Abdomen is nondistended, soft and nontender. No organomegaly or masses felt. Normal bowel sounds heard. Central nervous system: L sided weakness, facial asymmetry Extremities: right toe cyanosis, no clubbing Skin: No rashes, lesions or ulcers, on limited exam. Psychiatry: unable to assess due to depressed consciousness  Data Reviewed: I have personally reviewed following labs and imaging studies  CBC:  Recent Labs Lab 01/16/16 0439 01/18/16 0510 01/19/16 0149  WBC 12.7* 13.8* 19.1*  NEUTROABS  --   --  15.5*  HGB 12.9* 13.5 14.7  HCT 37.7* 40.1 43.7  MCV 97.9  99.0 100.7*  PLT 191 230 295   Basic Metabolic Panel:  Recent Labs Lab 01/14/16 0850 01/15/16 1405 01/15/16 1656 01/16/16 0439 01/16/16 1617 01/17/16 0000 01/18/16 0510  NA 138  --   --  143  --  140 144  K 4.1  --   --  3.3*  --  3.0* 3.8  CL 106  --   --  111  --  106 109  CO2 22  --   --  25  --  24 27  GLUCOSE 172*  --   --  172*  --  167* 162*  BUN 13  --   --  23*  --  19 18  CREATININE 0.98  --   --  0.82  --  0.66 0.67  CALCIUM 9.0  --   --  8.7*  --  8.4* 8.5*  MG 1.9 2.2 2.4 2.3 2.3  --   --   PHOS  --  3.0 2.8 1.7* 1.3*  --   --    GFR: Estimated Creatinine Clearance: 80.5 mL/min (by C-G formula based on SCr of 0.67 mg/dL). Liver Function  Tests:  Recent Labs Lab 01/14/16 0850  AST 34  ALT 28  ALKPHOS 58  BILITOT 3.0*  PROT 6.6  ALBUMIN 3.6   No results for input(s): LIPASE, AMYLASE in the last 168 hours. No results for input(s): AMMONIA in the last 168 hours. Coagulation Profile: No results for input(s): INR, PROTIME in the last 168 hours. Cardiac Enzymes: No results for input(s): CKTOTAL, CKMB, CKMBINDEX, TROPONINI in the last 168 hours. BNP (last 3 results) No results for input(s): PROBNP in the last 8760 hours. HbA1C: No results for input(s): HGBA1C in the last 72 hours. CBG:  Recent Labs Lab 01/19/16 2214 01/20/16 0025 01/20/16 0407 01/20/16 0814 01/20/16 1159  GLUCAP 166* 162* 147* 134* 128*   Lipid Profile: No results for input(s): CHOL, HDL, LDLCALC, TRIG, CHOLHDL, LDLDIRECT in the last 72 hours. Thyroid Function Tests:  Recent Labs  01/18/16 0704  TSH 2.191   Anemia Panel: No results for input(s): VITAMINB12, FOLATE, FERRITIN, TIBC, IRON, RETICCTPCT in the last 72 hours. Sepsis Labs:  Recent Labs Lab 01/18/16 [id number]  LATICACIDVEN 1.3    Recent Results (from the past 240 hour(s))  MRSA PCR Screening     Status: None   Collection Time: 01/25/2016  7:09 AM  Result Value Ref Range Status   MRSA by PCR NEGATIVE NEGATIVE Final    Comment:        The GeneXpert MRSA Assay (FDA approved for NASAL specimens only), is one component of a comprehensive MRSA colonization surveillance program. It is not intended to diagnose MRSA infection nor to guide or monitor treatment for MRSA infections.   Culture, blood (Routine X 2) w Reflex to ID Panel     Status: None   Collection Time: 01/15/16  4:40 AM  Result Value Ref Range Status   Specimen Description BLOOD RIGHT ARM  Final   Special Requests IN PEDIATRIC BOTTLE 3CC  Final   Culture NO GROWTH 5 DAYS  Final   Report Status 01/20/2016 FINAL  Final  Culture, blood (Routine X 2) w Reflex to ID Panel     Status: None   Collection Time:  01/15/16  4:50 AM  Result Value Ref Range Status   Specimen Description BLOOD RIGHT HAND  Final   Special Requests BOTTLES DRAWN AEROBIC ONLY 7CC  Final   Culture NO GROWTH 5 DAYS  Final   Report Status 01/20/2016 FINAL  Final  Culture, blood (routine x 2)     Status: None (Preliminary result)   Collection Time: 01/18/16  6:59 AM  Result Value Ref Range Status   Specimen Description BLOOD LEFT ANTECUBITAL  Final   Special Requests BOTTLES DRAWN AEROBIC ONLY  5CC  Final   Culture NO GROWTH 2 DAYS  Final   Report Status PENDING  Incomplete  Culture, blood (routine x 2)     Status: None (Preliminary result)   Collection Time: 01/18/16  7:04 AM  Result Value Ref Range Status   Specimen Description BLOOD LEFT HAND  Final   Special Requests BOTTLES DRAWN AEROBIC AND ANAEROBIC  5CC  Final   Culture NO GROWTH 2 DAYS  Final   Report Status PENDING  Incomplete         Radiology Studies: No results found.  Scheduled Meds: . alfuzosin  10 mg Oral Daily  . allopurinol  300 mg Oral Daily  . aspirin  300 mg Rectal Daily  . aspirin  325 mg Per Tube Daily  . atorvastatin  40 mg Oral q1800  . chlorhexidine  15 mL Mouth Rinse BID  . digoxin  0.125 mg Oral Daily  . enoxaparin (LOVENOX) injection  40 mg Subcutaneous Q24H  . insulin aspart  0-9 Units Subcutaneous TID WC  . levothyroxine  100 mcg Oral QAC breakfast  . mouth rinse  15 mL Mouth Rinse q12n4p  . metoprolol  5 mg Intravenous Q6H  . metoprolol  5 mg Intravenous Once  . piperacillin-tazobactam (ZOSYN)  IV  3.375 g Intravenous Q8H  . vancomycin  1,000 mg Intravenous Q12H   Continuous Infusions: . sodium chloride 50 mL/hr at 01/20/16 0227  . amiodarone 30 mg/hr (01/20/16 0226)     LOS: 7 days    Time spent: > 35 minutes   Penny Pia, MD [facility] Pager [phone]  If 7PM-7AM, please contact night-coverage [email or web address] Password [email or web address] 01/20/2016, 4:06 PM

## 2016-01-20 NOTE — Progress Notes (Signed)
Pt still in Afib rate stayed at 120's until around 0600 am when pt and wife started complaining of BiPAP being too tight, RN loosen up a little. Wife wants to take off BIPAP and give him break. RT Chelsea made aware and pt was placed on [place] @ 6 L/m. Pt stared having air hunger and HR went upto 160's. Morphine 2 mg given.

## 2016-01-21 ENCOUNTER — Inpatient Hospital Stay (HOSPITAL_COMMUNITY): Payer: Medicare Other

## 2016-01-21 LAB — GLUCOSE, CAPILLARY
GLUCOSE-CAPILLARY: 134 mg/dL — AB (ref 65–99)
GLUCOSE-CAPILLARY: 142 mg/dL — AB (ref 65–99)
GLUCOSE-CAPILLARY: 143 mg/dL — AB (ref 65–99)
Glucose-Capillary: 175 mg/dL — ABNORMAL HIGH (ref 65–99)
Glucose-Capillary: 110 mg/dL — ABNORMAL HIGH (ref 65–99)
Glucose-Capillary: 131 mg/dL — ABNORMAL HIGH (ref 65–99)

## 2016-01-21 LAB — VANCOMYCIN, TROUGH: Vancomycin Tr: 16 ug/mL (ref 15–20)

## 2016-01-21 NOTE — Plan of Care (Signed)
Problem: Safety: Goal: Ability to remain free from injury will improve Outcome: Progressing Pt oriented to room, place, time and situation.

## 2016-01-21 NOTE — Progress Notes (Signed)
Pharmacy Antibiotic Note Jerry [name] is a 83 y.o. male admitted on 02/02/2016. Currently on day 4 of Zosyn and vancomycin for coverage of aspiration pneumonia.  Vancomycin trough obtained this am is therapeutic at 16 (goal 15-20). SCr from 1/7 was stable.  Plan: 1. EI Zosyn 3.375 grams IV every 8 hours 2. Continue vancomycin 1000 mg IV every 12 hours 3. BMP in am  4. Would consider deescalating or adding stop date of abx 5. Consider changing amiodarone to PO as IV formulation is on national backorder with limited supply of IV remaninig in house    Height: 6\' 1"  (185.4 cm) Weight: 185 lb 6.5 oz (84.1 kg) IBW/kg (Calculated) : 79.9  Temp (24hrs), Avg:98.2 F (36.8 C), Min:96.9 F (36.1 C), Max:100.1 F (37.8 C)   Recent Labs Lab 01/16/16 0439 01/17/16 0000 01/18/16 0510 01/18/16 0659 01/19/16 0149 01/21/16 0855  WBC 12.7*  --  13.8*  --  19.1*  --   CREATININE 0.82 0.66 0.67  --   --   --   LATICACIDVEN  --   --   --  1.3  --   --   VANCOTROUGH  --   --   --   --   --  16    Estimated Creatinine Clearance: 80.5 mL/min (by C-G formula based on SCr of 0.67 mg/dL).    Allergies  Allergen Reactions  . Cardizem [Diltiazem Hcl] Rash    Antimicrobials this admission:   Vancomycin 1/7>>  Zosyn 1/7>> Unasyn 1/4 >>1/7   Dose adjustments this admission:   Microbiology results:  1/7 BCx: ngtd 1/4 BCx: ngF 1/2 MRSA PCR: neg  Pollyann SamplesAndy Shemia Bevel, PharmD, BCPS 01/21/2016, 10:57 AM Pager: [phone]

## 2016-01-21 NOTE — Progress Notes (Signed)
PROGRESS NOTE    Lorna FewHarold D Mcfadden  MWN:[id number] DOB: [birth year] DOA: 01/30/2016 PCP: Ezequiel KayserPERINI,MARK A, MD   Brief Narrative:  83 y.o. male who presents with acute onset of left facial droop with LUE and LLE weakness. Pt with R MCA stroke and Neurology on board.  Hospital stay has been complicated by dysphagia 2ary to stroke, aspiration, and atrial fibrillation. Prognosis seems poor and patient's conditions seems to be deteriorating.   Assessment & Plan:     Acute ischemic right MCA stroke [facility](HCC) - Neurology on board and signed off on 1/6 - Pt with depressed mental status, repeat CT scan on 1/7 and reported: Changes consistent with evolving right middle cerebral artery infarct. No significant hemorrhagic component is noted. Previously seen petechial hemorrhages are again noted. Per neurology "-Xarelto (rivaroxaban) daily prior to admission, now on aspirin 300 mg suppository daily due to large infarct. Consider to switch to eliquis in one week. " -hdl 69, resume home meds statin and fish oil when able to take oral meds --on 1/10, he talked and started to move his left side, but per RN , there is no gag reflex when she tried to insert feeding tube,  -patient has a very large stroke, not sure how much he can recover from it ( per wife, patient was very active and goes to the gyn regularly prior to the stroke) Wife also agreed to talk to palliative care   Aspiration pneumonia: acute hypoxic respiratory failure , required bipap cxr on 1/7 "Opacity and effusion in the right base, likely pneumonia in the setting of fever. Followup PA and lateral chest X-ray is recommended in 3-4 weeks following trial of antibiotic therapy to ensure resolution and exclude underlying malignancy." - he required bipap , now on nasal cannula, seems improving on abx    ATRIAL FIBRILLATION - Pt is currently on rectal aspirin, will transition to elliquis once ng inserted or able to take oral meds - On B blocker and  amiodarone drip, Allergic to Cardizem - Placed on amiodarone bolus and infusion, pharmacy assisting.     Essential hypertension - he was on permissive hypertension -Need to gradually normalized blood pressure, currently on B blocker   Hypokalemia - replacing enterally. Check mag    Hypothyroidism - stable continue synthroid   Nutrition: patient has pulled out ng tube two days ago, wife would like to give another try, patient agrees to it.   DVT prophylaxis: Lovenox Code Status: DNR Family Communication: wife at bedside Disposition Plan: remain in stepdown unit.   Consultants:   Neurology  Critical care  Palliative care  Inpatient rehab   Procedures: None   Antimicrobials: None  Subjective: Per wife, patient seems stronger today, he was able to move his left arm , he was able to nod to communicate. Wife want to give temporary feeding tube another try, (patient nodded to agree) ( he  pulled out a ng tube a few days ago)  Objective: Vitals:   01/21/16 0500 01/21/16 0600 01/21/16 0700 01/21/16 0745  BP: (!) 167/125 (!) 148/111 (!) 146/117 (!) 177/111  Pulse: 82 84 (!) 113 (!) 52  Resp: (!) 31 (!) 32 (!) 35 (!) 30  Temp:    100.1 F (37.8 C)  TempSrc:    Axillary  SpO2: 97% 99% 95% 99%  Weight: 84.1 kg (185 lb 6.5 oz)     Height:        Intake/Output Summary (Last 24 hours) at 01/21/16 0757 Last data filed at 01/21/16  0700  Gross per 24 hour  Intake          2161.63 ml  Output              550 ml  Net          1611.63 ml   Filed Weights   01/19/16 0527 01/20/16 0411 01/21/16 0500  Weight: 83.4 kg (183 lb 13.8 oz) 84 kg (185 lb 3 oz) 84.1 kg (185 lb 6.5 oz)    Examination:  General exam: still very lethargic, but does attempt to communicate and try to follow commands when awake Respiratory system: upper airway sounds, no wheezing Cardiovascular system: IRRR Gastrointestinal system: Abdomen is nondistended, soft and nontender. No organomegaly or  masses felt. Normal bowel sounds heard. Central nervous system: L sided weakness, facial asymmetry, he was able to move his left upper extremity and try to move his left leg for now today on 1/10 Extremities: right toe cyanosis, no clubbing Skin: No rashes, lesions or ulcers, on limited exam. Psychiatry: unable to assess due to depressed consciousness  Data Reviewed: I have personally reviewed following labs and imaging studies  CBC:  Recent Labs Lab 01/16/16 0439 01/18/16 0510 01/19/16 0149  WBC 12.7* 13.8* 19.1*  NEUTROABS  --   --  15.5*  HGB 12.9* 13.5 14.7  HCT 37.7* 40.1 43.7  MCV 97.9 99.0 100.7*  PLT 191 230 295   Basic Metabolic Panel:  Recent Labs Lab 01/14/16 0850 01/15/16 1405 01/15/16 1656 01/16/16 0439 01/16/16 1617 01/17/16 0000 01/18/16 0510  NA 138  --   --  143  --  140 144  K 4.1  --   --  3.3*  --  3.0* 3.8  CL 106  --   --  111  --  106 109  CO2 22  --   --  25  --  24 27  GLUCOSE 172*  --   --  172*  --  167* 162*  BUN 13  --   --  23*  --  19 18  CREATININE 0.98  --   --  0.82  --  0.66 0.67  CALCIUM 9.0  --   --  8.7*  --  8.4* 8.5*  MG 1.9 2.2 2.4 2.3 2.3  --   --   PHOS  --  3.0 2.8 1.7* 1.3*  --   --    GFR: Estimated Creatinine Clearance: 80.5 mL/min (by C-G formula based on SCr of 0.67 mg/dL). Liver Function Tests:  Recent Labs Lab 01/14/16 0850  AST 34  ALT 28  ALKPHOS 58  BILITOT 3.0*  PROT 6.6  ALBUMIN 3.6   No results for input(s): LIPASE, AMYLASE in the last 168 hours. No results for input(s): AMMONIA in the last 168 hours. Coagulation Profile: No results for input(s): INR, PROTIME in the last 168 hours. Cardiac Enzymes: No results for input(s): CKTOTAL, CKMB, CKMBINDEX, TROPONINI in the last 168 hours. BNP (last 3 results) No results for input(s): PROBNP in the last 8760 hours. HbA1C: No results for input(s): HGBA1C in the last 72 hours. CBG:  Recent Labs Lab 01/20/16 1654 01/20/16 2014 01/21/16 0011  01/21/16 0415 01/21/16 0744  GLUCAP 127* 117* 142* 134* 143*   Lipid Profile: No results for input(s): CHOL, HDL, LDLCALC, TRIG, CHOLHDL, LDLDIRECT in the last 72 hours. Thyroid Function Tests: No results for input(s): TSH, T4TOTAL, FREET4, T3FREE, THYROIDAB in the last 72 hours. Anemia Panel: No results for input(s): VITAMINB12, FOLATE, FERRITIN,  TIBC, IRON, RETICCTPCT in the last 72 hours. Sepsis Labs:  Recent Labs Lab 01/18/16 1610  LATICACIDVEN 1.3    Recent Results (from the past 240 hour(s))  MRSA PCR Screening     Status: None   Collection Time: Feb 12, 2016  7:09 AM  Result Value Ref Range Status   MRSA by PCR NEGATIVE NEGATIVE Final    Comment:        The GeneXpert MRSA Assay (FDA approved for NASAL specimens only), is one component of a comprehensive MRSA colonization surveillance program. It is not intended to diagnose MRSA infection nor to guide or monitor treatment for MRSA infections.   Culture, blood (Routine X 2) w Reflex to ID Panel     Status: None   Collection Time: 01/15/16  4:40 AM  Result Value Ref Range Status   Specimen Description BLOOD RIGHT ARM  Final   Special Requests IN PEDIATRIC BOTTLE 3CC  Final   Culture NO GROWTH 5 DAYS  Final   Report Status 01/20/2016 FINAL  Final  Culture, blood (Routine X 2) w Reflex to ID Panel     Status: None   Collection Time: 01/15/16  4:50 AM  Result Value Ref Range Status   Specimen Description BLOOD RIGHT HAND  Final   Special Requests BOTTLES DRAWN AEROBIC ONLY 7CC  Final   Culture NO GROWTH 5 DAYS  Final   Report Status 01/20/2016 FINAL  Final  Culture, blood (routine x 2)     Status: None (Preliminary result)   Collection Time: 01/18/16  6:59 AM  Result Value Ref Range Status   Specimen Description BLOOD LEFT ANTECUBITAL  Final   Special Requests BOTTLES DRAWN AEROBIC ONLY  5CC  Final   Culture NO GROWTH 2 DAYS  Final   Report Status PENDING  Incomplete  Culture, blood (routine x 2)     Status: None  (Preliminary result)   Collection Time: 01/18/16  7:04 AM  Result Value Ref Range Status   Specimen Description BLOOD LEFT HAND  Final   Special Requests BOTTLES DRAWN AEROBIC AND ANAEROBIC  5CC  Final   Culture NO GROWTH 2 DAYS  Final   Report Status PENDING  Incomplete         Radiology Studies: No results found.  Scheduled Meds: . alfuzosin  10 mg Oral Daily  . allopurinol  300 mg Oral Daily  . aspirin  300 mg Rectal Daily  . aspirin  325 mg Per Tube Daily  . atorvastatin  40 mg Oral q1800  . chlorhexidine  15 mL Mouth Rinse BID  . digoxin  0.125 mg Oral Daily  . enoxaparin (LOVENOX) injection  40 mg Subcutaneous Q24H  . insulin aspart  0-9 Units Subcutaneous TID WC  . levothyroxine  100 mcg Oral QAC breakfast  . mouth rinse  15 mL Mouth Rinse q12n4p  . metoprolol  10 mg Intravenous Q6H  . piperacillin-tazobactam (ZOSYN)  IV  3.375 g Intravenous Q8H  . vancomycin  1,000 mg Intravenous Q12H   Continuous Infusions: . sodium chloride 50 mL/hr at 01/20/16 2212  . amiodarone 30 mg/hr (01/20/16 0226)     LOS: 8 days    Time spent: > 35 minutes   Caitlynn Ju, MD PhD [facility] Pager [phone]  If 7PM-7AM, please contact night-coverage [email or web address] Password [email or web address] 01/21/2016, 7:57 AM

## 2016-01-21 NOTE — Progress Notes (Addendum)
yellow metal Ring Lt  ring finger taken off due to swelling of Lt hand Given immediately in a small zip lock bag and she( wife) put it in her purse

## 2016-01-21 NOTE — Progress Notes (Signed)
Attempted to insert NGT at 1430 - readjusted post port ABD xray - next exam showed no advancement. At 1900 NGT pulled Dr Roda ShuttersXU said to order Cortrak feeding placement for 10/11 and dietary to order feeding to begin after that.

## 2016-01-22 ENCOUNTER — Inpatient Hospital Stay (HOSPITAL_COMMUNITY): Payer: Medicare Other

## 2016-01-22 LAB — CBC WITH DIFFERENTIAL/PLATELET
BASOS ABS: 0 10*3/uL (ref 0.0–0.1)
BASOS PCT: 0 %
Eosinophils Absolute: 0.1 10*3/uL (ref 0.0–0.7)
Eosinophils Relative: 1 %
HEMATOCRIT: 42.6 % (ref 39.0–52.0)
HEMOGLOBIN: 13.8 g/dL (ref 13.0–17.0)
LYMPHS PCT: 16 %
Lymphs Abs: 1.9 10*3/uL (ref 0.7–4.0)
MCH: 33.2 pg (ref 26.0–34.0)
MCHC: 32.4 g/dL (ref 30.0–36.0)
MCV: 102.4 fL — ABNORMAL HIGH (ref 78.0–100.0)
Monocytes Absolute: 0.7 10*3/uL (ref 0.1–1.0)
Monocytes Relative: 6 %
NEUTROS ABS: 9.2 10*3/uL — AB (ref 1.7–7.7)
NEUTROS PCT: 77 %
Platelets: 262 10*3/uL (ref 150–400)
RBC: 4.16 MIL/uL — ABNORMAL LOW (ref 4.22–5.81)
RDW: 14.9 % (ref 11.5–15.5)
WBC: 11.9 10*3/uL — ABNORMAL HIGH (ref 4.0–10.5)

## 2016-01-22 LAB — GLUCOSE, CAPILLARY
GLUCOSE-CAPILLARY: 125 mg/dL — AB (ref 65–99)
GLUCOSE-CAPILLARY: 130 mg/dL — AB (ref 65–99)

## 2016-01-22 LAB — BASIC METABOLIC PANEL
ANION GAP: 7 (ref 5–15)
Anion gap: 9 (ref 5–15)
BUN: 36 mg/dL — AB (ref 6–20)
BUN: 37 mg/dL — AB (ref 6–20)
CALCIUM: 8.1 mg/dL — AB (ref 8.9–10.3)
CHLORIDE: 121 mmol/L — AB (ref 101–111)
CO2: 26 mmol/L (ref 22–32)
CO2: 24 mmol/L (ref 22–32)
CREATININE: 1.24 mg/dL (ref 0.61–1.24)
Calcium: 8.4 mg/dL — ABNORMAL LOW (ref 8.9–10.3)
Chloride: 122 mmol/L — ABNORMAL HIGH (ref 101–111)
Creatinine, Ser: 1.19 mg/dL (ref 0.61–1.24)
GFR calc Af Amer: >60 mL/min (ref >60)
GFR calc non Af Amer: 55 mL/min — ABNORMAL LOW (ref >60)
GFR calc non Af Amer: 52 mL/min — ABNORMAL LOW (ref >60)
Glucose, Bld: 135 mg/dL — ABNORMAL HIGH (ref 65–99)
Glucose, Bld: 172 mg/dL — ABNORMAL HIGH (ref 65–99)
POTASSIUM: 3.2 mmol/L — AB (ref 3.5–5.1)
Potassium: 3.3 mmol/L — ABNORMAL LOW (ref 3.5–5.1)
SODIUM: 156 mmol/L — AB (ref 135–145)
Sodium: 153 mmol/L — ABNORMAL HIGH (ref 135–145)

## 2016-01-22 LAB — MAGNESIUM: MAGNESIUM: 2.6 mg/dL — AB (ref 1.7–2.4)

## 2016-01-22 LAB — PHOSPHORUS: Phosphorus: 3 mg/dL (ref 2.5–4.6)

## 2016-01-22 MED ORDER — FLORANEX PO PACK
1.0000 g | PACK | Freq: Three times a day (TID) | ORAL | Status: DC
  Administered 2016-01-22 – 2016-01-24 (×4): 1 g
  Filled 2016-01-22 (×8): qty 1

## 2016-01-22 MED ORDER — VITAL AF 1.2 CAL PO LIQD
1000.0000 mL | ORAL | Status: DC
  Administered 2016-01-22 – 2016-01-25 (×3): 1000 mL
  Filled 2016-01-22 (×9): qty 1000

## 2016-01-22 MED ORDER — KCL IN DEXTROSE-NACL 20-5-0.45 MEQ/L-%-% IV SOLN
INTRAVENOUS | Status: DC
  Administered 2016-01-22 – 2016-01-23 (×3): via INTRAVENOUS
  Administered 2016-01-24: 1000 mL via INTRAVENOUS
  Filled 2016-01-22 (×7): qty 1000

## 2016-01-22 MED ORDER — APIXABAN 5 MG PO TABS
5.0000 mg | ORAL_TABLET | Freq: Two times a day (BID) | ORAL | Status: DC
  Administered 2016-01-22 – 2016-01-25 (×7): 5 mg via ORAL
  Filled 2016-01-22 (×9): qty 1

## 2016-01-22 MED ORDER — FREE WATER
200.0000 mL | Freq: Four times a day (QID) | Status: DC
  Administered 2016-01-22 – 2016-01-23 (×2): 200 mL

## 2016-01-22 MED ORDER — METOPROLOL TARTRATE 12.5 MG HALF TABLET
12.5000 mg | ORAL_TABLET | Freq: Two times a day (BID) | ORAL | Status: DC
  Administered 2016-01-22 – 2016-01-23 (×2): 12.5 mg
  Filled 2016-01-22 (×2): qty 1

## 2016-01-22 NOTE — Progress Notes (Signed)
ANTICOAGULATION CONSULT NOTE - Initial Consult  Pharmacy Consult for Eliquis Indication: atrial fibrillation  Allergies  Allergen Reactions  . Cardizem [Diltiazem Hcl] Rash    Patient Measurements: Height: 6\' 1"  (185.4 cm) Weight: 186 lb 4.6 oz (84.5 kg) IBW/kg (Calculated) : 79.9  Vital Signs: Temp: 99.6 F (37.6 C) (01/11 1100) Temp Source: Axillary (01/11 1100) BP: 152/110 (01/11 1100) Pulse Rate: 53 (01/11 1100)  Assessment: 83 yo M presented with left sided weakness and found to have stroke. Was on Xarelto PTA. Neuro recommends switching to Eliquis with failed Xarelto therapy. Feeding tube in place today and switching meds to PO. Hgb stable and plts wnl. SCr 1.19. Last Lovenox VTE prophylaxis dose was on 1/10.  Goal of Therapy:  Monitor platelets by anticoagulation protocol: Yes   Plan:  Start Eliquis 5mg  PO BID tonight Monitor CBC, s/s of bleed  Enzo BiNathan Jozie Wulf, PharmD, BCPS Clinical Pharmacist Pager [phone] 01/22/2016 2:52 PM

## 2016-01-22 NOTE — Progress Notes (Signed)
Spoke with Dr. Roda ShuttersXu and she stated that Cortrak is okay to use per KUB result.

## 2016-01-22 NOTE — Progress Notes (Addendum)
Nutrition Consult/Follow Up  INTERVENTION:    Re-start Vital AF 1.2 formula at 20 ml/hr and increase by 10 ml every 4 hours to goal rate of 70 ml/hr    Free water flushes at 200 ml every 6 hours  Total TF regimen + free water to provide 2016 kcals, 126 gm protein, 2162 ml of water  NUTRITION DIAGNOSIS:   Inadequate oral intake related to inability to eat as evidenced by NPO status, ongoing  GOAL:   Patient will meet greater than or equal to 90% of their needs, met  MONITOR:   TF tolerance, Diet advancement, Labs, Weight trends, I & O's  ASSESSMENT:   Pt with PMH of chronic afib on Xarelto admitted with acute R MCA stroke.    Pt off BiPAP >> now on nasal cannula. Has had multiple CORTRAK tubes placed with pt removing. New CORTRAK placed this AM >> tip in distal stomach. Vital AF 1.2 formula re-started per this RD >> goal rate is 70 ml/hr. CBG's [id number].  Diet Order:  Diet NPO time specified  Skin:  Reviewed, no issues  Last BM:  1/4  Height:   Ht Readings from Last 1 Encounters:  02/01/2016 '6\' 1"'$  (1.854 m)    Weight:   Wt Readings from Last 1 Encounters:  01/22/16 186 lb 4.6 oz (84.5 kg)    Ideal Body Weight:  83.6 kg  BMI:  Body mass index is 24.58 kg/m.  Estimated Nutritional Needs:   Kcal:  2000-2200  Protein:  105-120 grams  Fluid:  > 2 L/day  EDUCATION NEEDS:   No education needs identified at this time  Arthur Holms, RD, LDN Pager #: [phone] After-Hours Pager #: [phone]

## 2016-01-22 NOTE — Care Management Important Message (Signed)
Important Message  Patient Details  Name: Jerry [name] MRN: [id number] Date of Birth: 1931-12-23   Medicare Important Message Given:  Yes    Dorena BodoIris Cole Klugh 01/22/2016, 12:23 PM

## 2016-01-22 NOTE — Progress Notes (Signed)
PROGRESS NOTE    Jerry [name]  VWU:[id number] DOB: Sep 03, 1931 DOA: 2016-01-16 PCP: Ezequiel Kayser, MD   Brief Narrative:  83 y.o. male who presents with acute onset of left facial droop with LUE and LLE weakness. Pt with R MCA stroke and Neurology on board.  Hospital stay has been complicated by dysphagia 2ary to stroke, aspiration, and atrial fibrillation. Prognosis seems poor and patient's conditions seems to be deteriorating.   Assessment & Plan:     Acute ischemic right MCA stroke [facility]) - Neurology on board and signed off on 1/6 - Pt with depressed mental status, repeat CT scan on 1/7 and reported: Changes consistent with evolving right middle cerebral artery infarct. No significant hemorrhagic component is noted. Previously seen petechial hemorrhages are again noted. Per neurology "-Xarelto (rivaroxaban) daily prior to admission, now on aspirin 300 mg suppository daily due to large infarct. Consider to switch to eliquis in one week. " -hdl 69, resume home meds statin and fish oil when able to take oral meds --on 1/10, he talked and started to move his left side, but per RN , there is no gag reflex when she tried to insert feeding tube,  -feeding tube placed on 1/11, d/c asa, start eliquis per neurology recommendation. -patient has a very large stroke, not sure how much he can recover from it ( per wife, patient was very active and goes to the gyn regularly prior to the stroke) Wife also agreed to talk to palliative care   Aspiration pneumonia: acute hypoxic respiratory failure , required bipap cxr on 1/7 "Opacity and effusion in the right base, likely pneumonia in the setting of fever. Followup PA and lateral chest X-ray is recommended in 3-4 weeks following trial of antibiotic therapy to ensure resolution and exclude underlying malignancy." - he required bipap , now on nasal cannula, seems improving on abx -temporary feeding tube placed in on 1/11, keep head of bed elevated,  continue aspiration precaution   chronic ATRIAL FIBRILLATION (h/o Allergic to Cardizem) - on xarelto /dig at home -He has been on rectal aspirin due to npo, transitioned to elliquis once ng inserted on 1/11 -he was on iv lopressor and amiodarone drip due to npo, now has feeding tube, transition meds to oral -continue adjust meds to achieve rate control     Essential hypertension - he was on permissive hypertension -Need to gradually normalized blood pressure, currently on B blocker  Hypernatremia: likely from dehydration, continue ivf, now feeding tube placed in, free water though feeding tube as well, repeat bmp  Hypokalemia - replacing enterally. Check mag    Hypothyroidism - stable continue synthroid   Nutrition:  patient has pulled out ng tube once, wife would like to give another try, patient agrees to it.  Replace feeding tube on 1/11, start tube feeds, nutrition following  DVT prophylaxis: was on Lovenox, started eliquis on 1/11 Code Status: DNR Family Communication: wife at bedside Disposition Plan: remain in stepdown unit.   Consultants:   Neurology  Critical care  Palliative care  Inpatient rehab   Procedures: None   Antimicrobials: None  Subjective: Patient was awake this am, he recognized palliative care provider. He is sleeping this afternoon, wife at bedside He has rectal tube in place, per RN no diarrhea in the last 24hrs, I do not see liquid stool either Condom catheter inplace, with clear urine,  Feeding tube per nare.   Objective: Vitals:   01/22/16 0019 01/22/16 0355 01/22/16 0400 01/22/16 0500  BP: Marland Kitchen)  161/109 (!) 161/124 (!) 174/119   Pulse: 85     Resp: (!) 25 (!) 24    Temp: (!) 96.8 F (36 C) 98.2 F (36.8 C)    TempSrc: Axillary     SpO2: 96% 97%    Weight:    84.5 kg (186 lb 4.6 oz)  Height:        Intake/Output Summary (Last 24 hours) at 01/22/16 0813 Last data filed at 01/22/16 0800  Gross per 24 hour  Intake            1900.8 ml  Output             1105 ml  Net            795.8 ml   Filed Weights   01/20/16 0411 01/21/16 0500 01/22/16 0500  Weight: 84 kg (185 lb 3 oz) 84.1 kg (185 lb 6.5 oz) 84.5 kg (186 lb 4.6 oz)    Examination:  General exam: sleeping Respiratory system: upper airway sounds, no wheezing Cardiovascular system: IRRR Gastrointestinal system: Abdomen is nondistended, soft and nontender. No organomegaly or masses felt. Normal bowel sounds heard. Central nervous system: sleeping, moving right side spontaneously Extremities: right toe cyanosis, no clubbing Skin: No rashes, lesions or ulcers, on limited exam. Psychiatry: unable to assess due to depressed consciousness  Data Reviewed: I have personally reviewed following labs and imaging studies  CBC:  Recent Labs Lab 01/16/16 0439 01/18/16 0510 01/19/16 0149 01/22/16 0221  WBC 12.7* 13.8* 19.1* 11.9*  NEUTROABS  --   --  15.5* 9.2*  HGB 12.9* 13.5 14.7 13.8  HCT 37.7* 40.1 43.7 42.6  MCV 97.9 99.0 100.7* 102.4*  PLT 191 230 295 262   Basic Metabolic Panel:  Recent Labs Lab 01/15/16 1405 01/15/16 1656 01/16/16 0439 01/16/16 1617 01/17/16 0000 01/18/16 0510 01/22/16 0221  NA  --   --  143  --  140 144 156*  K  --   --  3.3*  --  3.0* 3.8 3.2*  CL  --   --  111  --  106 109 121*  CO2  --   --  25  --  24 27 26   GLUCOSE  --   --  172*  --  167* 162* 135*  BUN  --   --  23*  --  19 18 36*  CREATININE  --   --  0.82  --  0.66 0.67 1.19  CALCIUM  --   --  8.7*  --  8.4* 8.5* 8.4*  MG 2.2 2.4 2.3 2.3  --   --  2.6*  PHOS 3.0 2.8 1.7* 1.3*  --   --  3.0   GFR: Estimated Creatinine Clearance: 54.1 mL/min (by C-G formula based on SCr of 1.19 mg/dL). Liver Function Tests: No results for input(s): AST, ALT, ALKPHOS, BILITOT, PROT, ALBUMIN in the last 168 hours. No results for input(s): LIPASE, AMYLASE in the last 168 hours. No results for input(s): AMMONIA in the last 168 hours. Coagulation Profile: No results for  input(s): INR, PROTIME in the last 168 hours. Cardiac Enzymes: No results for input(s): CKTOTAL, CKMB, CKMBINDEX, TROPONINI in the last 168 hours. BNP (last 3 results) No results for input(s): PROBNP in the last 8760 hours. HbA1C: No results for input(s): HGBA1C in the last 72 hours. CBG:  Recent Labs Lab 01/21/16 1206 01/21/16 1651 01/21/16 2028 01/22/16 0015 01/22/16 0344  GLUCAP 175* 110* 131* 125* 130*   Lipid Profile: No  results for input(s): CHOL, HDL, LDLCALC, TRIG, CHOLHDL, LDLDIRECT in the last 72 hours. Thyroid Function Tests: No results for input(s): TSH, T4TOTAL, FREET4, T3FREE, THYROIDAB in the last 72 hours. Anemia Panel: No results for input(s): VITAMINB12, FOLATE, FERRITIN, TIBC, IRON, RETICCTPCT in the last 72 hours. Sepsis Labs:  Recent Labs Lab 01/18/16 1610  LATICACIDVEN 1.3    Recent Results (from the past 240 hour(s))  MRSA PCR Screening     Status: None   Collection Time: 02/08/2016  7:09 AM  Result Value Ref Range Status   MRSA by PCR NEGATIVE NEGATIVE Final    Comment:        The GeneXpert MRSA Assay (FDA approved for NASAL specimens only), is one component of a comprehensive MRSA colonization surveillance program. It is not intended to diagnose MRSA infection nor to guide or monitor treatment for MRSA infections.   Culture, blood (Routine X 2) w Reflex to ID Panel     Status: None   Collection Time: 01/15/16  4:40 AM  Result Value Ref Range Status   Specimen Description BLOOD RIGHT ARM  Final   Special Requests IN PEDIATRIC BOTTLE 3CC  Final   Culture NO GROWTH 5 DAYS  Final   Report Status 01/20/2016 FINAL  Final  Culture, blood (Routine X 2) w Reflex to ID Panel     Status: None   Collection Time: 01/15/16  4:50 AM  Result Value Ref Range Status   Specimen Description BLOOD RIGHT HAND  Final   Special Requests BOTTLES DRAWN AEROBIC ONLY 7CC  Final   Culture NO GROWTH 5 DAYS  Final   Report Status 01/20/2016 FINAL  Final  Culture,  blood (routine x 2)     Status: None (Preliminary result)   Collection Time: 01/18/16  6:59 AM  Result Value Ref Range Status   Specimen Description BLOOD LEFT ANTECUBITAL  Final   Special Requests BOTTLES DRAWN AEROBIC ONLY  5CC  Final   Culture NO GROWTH 3 DAYS  Final   Report Status PENDING  Incomplete  Culture, blood (routine x 2)     Status: None (Preliminary result)   Collection Time: 01/18/16  7:04 AM  Result Value Ref Range Status   Specimen Description BLOOD LEFT HAND  Final   Special Requests BOTTLES DRAWN AEROBIC AND ANAEROBIC  5CC  Final   Culture NO GROWTH 3 DAYS  Final   Report Status PENDING  Incomplete         Radiology Studies: Dg Abd Portable 1v  Result Date: 01/21/2016 CLINICAL DATA:  Advancement of feeding tube EXAM: PORTABLE ABDOMEN - 1 VIEW COMPARISON:  Radiograph 01/21/2016 at 1501 hours, CT 01/14/2016 FINDINGS: Feeding tube is unchanged in position from comparison exam. Tube is in the course of the LEFT mainstem bronchus as well as the esophagus on comparison CT. Cannot exclude intubation of the LEFT mainstem bronchus. IMPRESSION: No change in position of the NG tube. Cannot exclude intubation of the LEFT mainstem bronchus. Findings conveyed toTroyce, RN on 01/21/2016  at16:23. Electronically Signed   By: Genevive Bi M.D.   On: 01/21/2016 16:24   Dg Abd Portable 1v  Result Date: 01/21/2016 CLINICAL DATA:  Feeding tube placement EXAM: PORTABLE ABDOMEN - 1 VIEW COMPARISON:  Portable exam 1500 hours AP ate at 1501 hours compared to 01/17/2016 and correlated with CT chest of 01/14/2016 FINDINGS: Tip of nasogastric tube projects LEFT paraspinal at the inferior chest, projecting over the LEFT heart. This appears to be within a tortuous esophagus  and follows the course of a feeding tube seen on a prior CT chest exam. Proximal side-port is not visualized. This would need advanced 11 cm to place tip in the stomach, uncertain as to distance required to place proximal  side-port in the stomach since is not visualized. Atelectasis versus consolidation LEFT lower lobe. RIGHT lung base clear. Visualized bowel gas pattern normal. Degenerative changes and osseous demineralization of the thoracolumbar spine. IMPRESSION: Tip of nasogastric tube projects LEFT paraspinal at the level of the cardiac silhouette, corresponding to the course of the feeding tube identified on a prior CT exam, recommend advancing tube in the stomach as above. Findings called to charge nurse on [facility] [phone]) on 01/21/2016 at 1519 hours. Electronically Signed   By: Ulyses SouthwardMark  Boles M.D.   On: 01/21/2016 15:19    Scheduled Meds: . alfuzosin  10 mg Oral Daily  . allopurinol  300 mg Oral Daily  . aspirin  300 mg Rectal Daily  . aspirin  325 mg Per Tube Daily  . atorvastatin  40 mg Oral q1800  . chlorhexidine  15 mL Mouth Rinse BID  . digoxin  0.125 mg Oral Daily  . enoxaparin (LOVENOX) injection  40 mg Subcutaneous Q24H  . insulin aspart  0-9 Units Subcutaneous TID WC  . levothyroxine  100 mcg Oral QAC breakfast  . mouth rinse  15 mL Mouth Rinse q12n4p  . metoprolol  10 mg Intravenous Q6H  . piperacillin-tazobactam (ZOSYN)  IV  3.375 g Intravenous Q8H   Continuous Infusions: . amiodarone 30 mg/hr (01/22/16 0350)  . dextrose 5 % and 0.45 % NaCl with KCl 20 mEq/L       LOS: 9 days    Time spent: > 35 minutes   Kervin Bones, MD PhD [facility] Pager [phone]  If 7PM-7AM, please contact night-coverage [email or web address] Password [email or web address] 01/22/2016, 8:13 AM

## 2016-01-23 ENCOUNTER — Inpatient Hospital Stay (HOSPITAL_COMMUNITY): Payer: Medicare Other

## 2016-01-23 DIAGNOSIS — Z515 Encounter for palliative care: Secondary | ICD-10-CM

## 2016-01-23 DIAGNOSIS — Z7189 Other specified counseling: Secondary | ICD-10-CM

## 2016-01-23 LAB — HEPATIC FUNCTION PANEL
ALT: 123 U/L — AB (ref 17–63)
AST: 70 U/L — AB (ref 15–41)
Albumin: 2.2 g/dL — ABNORMAL LOW (ref 3.5–5.0)
Alkaline Phosphatase: 58 U/L (ref 38–126)
BILIRUBIN DIRECT: 0.5 mg/dL (ref 0.1–0.5)
BILIRUBIN INDIRECT: 1.7 mg/dL — AB (ref 0.3–0.9)
BILIRUBIN TOTAL: 2.2 mg/dL — AB (ref 0.3–1.2)
Total Protein: 5.5 g/dL — ABNORMAL LOW (ref 6.5–8.1)

## 2016-01-23 LAB — PHOSPHORUS: Phosphorus: 1.8 mg/dL — ABNORMAL LOW (ref 2.5–4.6)

## 2016-01-23 LAB — CBC WITH DIFFERENTIAL/PLATELET
Basophils Absolute: 0 10*3/uL (ref 0.0–0.1)
Basophils Relative: 0 %
EOS ABS: 0.3 10*3/uL (ref 0.0–0.7)
EOS PCT: 3 %
HCT: 40.9 % (ref 39.0–52.0)
Hemoglobin: 13.5 g/dL (ref 13.0–17.0)
LYMPHS PCT: 12 %
Lymphs Abs: 1.4 10*3/uL (ref 0.7–4.0)
MCH: 33.1 pg (ref 26.0–34.0)
MCHC: 33 g/dL (ref 30.0–36.0)
MCV: 100.2 fL — AB (ref 78.0–100.0)
MONO ABS: 0.8 10*3/uL (ref 0.1–1.0)
Monocytes Relative: 7 %
Neutro Abs: 9.1 10*3/uL — ABNORMAL HIGH (ref 1.7–7.7)
Neutrophils Relative %: 78 %
PLATELETS: 231 10*3/uL (ref 150–400)
RBC: 4.08 MIL/uL — AB (ref 4.22–5.81)
RDW: 14.2 % (ref 11.5–15.5)
WBC: 11.7 10*3/uL — AB (ref 4.0–10.5)

## 2016-01-23 LAB — BASIC METABOLIC PANEL
Anion gap: 10 (ref 5–15)
BUN: 38 mg/dL — AB (ref 6–20)
CALCIUM: 8.2 mg/dL — AB (ref 8.9–10.3)
CO2: 26 mmol/L (ref 22–32)
Chloride: 119 mmol/L — ABNORMAL HIGH (ref 101–111)
Creatinine, Ser: 1.34 mg/dL — ABNORMAL HIGH (ref 0.61–1.24)
GFR calc Af Amer: 55 mL/min — ABNORMAL LOW (ref >60)
GFR calc non Af Amer: 48 mL/min — ABNORMAL LOW (ref >60)
Glucose, Bld: 173 mg/dL — ABNORMAL HIGH (ref 65–99)
Potassium: 2.8 mmol/L — ABNORMAL LOW (ref 3.5–5.1)
Sodium: 155 mmol/L — ABNORMAL HIGH (ref 135–145)

## 2016-01-23 LAB — C DIFFICILE QUICK SCREEN W PCR REFLEX
C Diff antigen: NEGATIVE
C Diff interpretation: NOT DETECTED
C Diff toxin: NEGATIVE

## 2016-01-23 LAB — MAGNESIUM: Magnesium: 2.5 mg/dL — ABNORMAL HIGH (ref 1.7–2.4)

## 2016-01-23 LAB — GLUCOSE, CAPILLARY
GLUCOSE-CAPILLARY: 119 mg/dL — AB (ref 65–99)
GLUCOSE-CAPILLARY: 157 mg/dL — AB (ref 65–99)
Glucose-Capillary: 129 mg/dL — ABNORMAL HIGH (ref 65–99)
Glucose-Capillary: 142 mg/dL — ABNORMAL HIGH (ref 65–99)

## 2016-01-23 MED ORDER — FREE WATER
200.0000 mL | Status: AC
  Administered 2016-01-23: 200 mL

## 2016-01-23 MED ORDER — POTASSIUM CHLORIDE 20 MEQ/15ML (10%) PO SOLN
40.0000 meq | ORAL | Status: AC
  Administered 2016-01-23: 40 meq
  Filled 2016-01-23: qty 30

## 2016-01-23 MED ORDER — METOPROLOL TARTRATE 25 MG PO TABS
25.0000 mg | ORAL_TABLET | Freq: Two times a day (BID) | ORAL | Status: DC
  Administered 2016-01-23: 25 mg
  Filled 2016-01-23: qty 1

## 2016-01-23 MED ORDER — LORAZEPAM 2 MG/ML IJ SOLN
0.5000 mg | Freq: Three times a day (TID) | INTRAMUSCULAR | Status: DC | PRN
  Administered 2016-01-23 – 2016-01-27 (×3): 0.5 mg via INTRAVENOUS
  Filled 2016-01-23 (×3): qty 1

## 2016-01-23 NOTE — Progress Notes (Signed)
ANTICOAGULATION CONSULT NOTE Pharmacy Consult for Eliquis Indication: atrial fibrillation  Allergies  Allergen Reactions  . Cardizem [Diltiazem Hcl] Rash    Patient Measurements: Height: 6\' 1"  (185.4 cm) Weight: 184 lb 4.9 oz (83.6 kg) IBW/kg (Calculated) : 79.9  Vital Signs: Temp: 97.5 F (36.4 C) (01/12 0800) Temp Source: Oral (01/12 0800) BP: 133/85 (01/12 0800) Pulse Rate: 108 (01/12 0800)  Assessment: 83 yo M presented with left sided weakness and found to have stroke. On xarelto PTA but transitioned to apixaban on 1/11 as considered a xarelto failure.  CBC stable with no reported bleeding.    Goal of Therapy:  Monitor platelets by anticoagulation protocol: Yes   Plan:  Continue apixaban (Eliquis) 5mg  PO BID Monitor CBC, s/s of bleed  Pollyann SamplesAndy Dlisa Barnwell, PharmD, BCPS 01/23/2016, 8:21 AM

## 2016-01-23 NOTE — Progress Notes (Signed)
Modified Barium Swallow Progress Note  Patient Details  Name: Jerry FewHarold D Bunn MRN: [id number] Date of Birth: 01-08-1932  Today's Date: 01/23/2016  Modified Barium Swallow completed.  Full report located under Chart Review in the Imaging Section.  Brief recommendations include the following:  Clinical Impression  Limited repeat MBS was completed to determine current swallowing physiology and least restrictive diet using thin liquids via tsp and pureed boluses.  The patient required oral suctioning of the pureed material due to lack of a/p movement of the bolus despite max multi modal cues.  The patient presented with a severe oropharyngeal dysphagia characterized by delayed oral transit, anterior escape given liquids, delayed swallow trigger, very decreased pharyngeal squeeze and epiglottic deflection, reduced tongue based retraction and reduced hyo-laryngeal movement.  Silent penetration was seen prior to, during and after the swallow to the cords.  The patient made no attempt to eject the material.  Given cues he did produce a weak cough that was mildly successful to clear.  Given pureed material the patient had very decreased bolus awareness and despite max cues to swallow he continued to talk with material in his oral cavity and there was limited a/p movement of the bolus.  The patient was orally suctioned and he finally did trigger a pharyngeal swallow.  Similar deficits as noted above were seen but he did not appear to penetrate.  Given results of MBS safest diet is NPO.  Long term non oral nutrition may need to be considered.  ST will continue to follow in attempt to initiate swallowing therapy.  Prognosis is fair as the patient is struggling to follow all commands.     Swallow Evaluation Recommendations       SLP Diet Recommendations: NPO     Medication Administration: Via alternative means     Oral Care Recommendations: Oral care QID   Other Recommendations: Remove water pitcher;Have  oral suction available   Dimas AguasMelissa Travious Vanover, MA, CCC-SLP Acute Rehab SLP [phone] Dimas AguasGoodman, Nathin Saran N 01/23/2016,10:17 AM

## 2016-01-23 NOTE — Progress Notes (Signed)
Cortrak tube reinserted in patients Right nare to 100cm. This is the second insertion for this patient. Pt has mitten on right hand and left hand lack movement at this time. Xray has been ordered and the tube has been added to the patients assessment.Please save the feeding tube if it is removed. The tube may be reinserted. Please contact the Cortrak team with any questions or concerns.

## 2016-01-23 NOTE — Consult Note (Signed)
Consultation Note Date: 01/23/2016   Patient Name: Jerry [name]  DOB: 06-30-1931  MRN: [id number]  Age / Sex: 83 y.o., male  PCP: Crist Infante, MD Referring Physician: Florencia Reasons, MD  Reason for Consultation: Establishing goals of care with new large stroke with severe dysphagia and poor prognosis.   HPI/Patient Profile: 83 y.o. male  with past medical history of atrial fibrillation, hypertension, hypothyroidism and gout  admitted on 02/03/2016 with large right MCA infarct and evolving infarct on 01/18/16. He has had severe dysphagia and aspiration that has complicated hospital course.   Clinical Assessment and Goals of Care: I met today with Mrs. Grover (Mr. Emmick's confused and unable to participate in discussion). Mrs. Isidore has good understanding of her husband's diagnosis and prognosis. She describes him as very active prior to his stroke and hospitalization.   Fortunately she feels that they have had many conversations regarding EOL and QOL and she knows that he would not be satisfied with his QOL with a long term feeding tube - no PEG (unless he drastically improved to the point of being able to tell us what he wishes and IF he wished for PEG - this is unlikely).   We discussed to give him more time (through the weekend) with trial of Cortrak. Will reevaluate goals on Monday. We discussed aggressive vs comfort care including comfort feeds and hospice facility. Discussed risk of decline with aspiration, mental status, physical weakness effecting QOL. Wife tells me that she is preparing herself for his passing and understands that he will likely need transition to hospice care.   Primary Decision Maker HCPOA wife    SUMMARY OF RECOMMENDATIONS   - Trial of Cortrak over the weekend - Reevaluate GOC on Monday - Open to comfort care and hospice if little/no improvement - NO PEG  Code Status/Advance Care  Planning:  DNR   Symptom Management:   Agitation: Recommend Haldol 1-2 mg every 8 hours prn.   Palliative Prophylaxis:   Aspiration, Bowel Regimen, Delirium Protocol and Turn Reposition, Oral care  Psycho-social/Spiritual:   Desire for further Chaplaincy support:yes  Additional Recommendations: Caregiving  Support/Resources, Education on Hospice and Grief/Bereavement Support  Prognosis:   Likely very poor: With severe dysphagia and without artificial feeding would be eligible for hospice facility.   Discharge Planning: To Be Determined      Primary Diagnoses: Present on Admission: . Acute ischemic right MCA stroke ([place]) . Hypothyroidism . ATRIAL FIBRILLATION . Essential hypertension . CVA (cerebral vascular accident) ([place])   I have reviewed the medical record, interviewed the patient and family, and examined the patient. The following aspects are pertinent.  Past Medical History:  Diagnosis Date  . Dyslipidemia   . Gout   . HTN (hypertension)   . Hypothyroidism   . Paroxysmal atrial fibrillation [facility])    Social History   Social History  . Marital status: Married    Spouse name: N/A  . Number of children: 1  . Years of education: N/A   Occupational History  .  Personnel officer Retired   Social History Main Topics  . Smoking status: Never Smoker  . Smokeless tobacco: Never Used  . Alcohol use No  . Drug use: No  . Sexual activity: Not Asked   Other Topics Concern  . None   Social History Narrative  . None   Family History  Problem Relation Age of Onset  . Stroke Father   . Hypertension Father   . Other Mother     CARDIOMEGALY  . Hypertension Mother   . Lung cancer Mother   . Diabetes Brother   . COPD Child    Scheduled Meds: . allopurinol  300 mg Oral Daily  . apixaban  5 mg Oral BID  . atorvastatin  40 mg Oral q1800  . chlorhexidine  15 mL Mouth Rinse BID  . digoxin  0.125 mg Oral Daily  . free water  200 mL Per Tube Q4H  .  insulin aspart  0-9 Units Subcutaneous TID WC  . lactobacillus  1 g Per Tube TID WC  . levothyroxine  100 mcg Oral QAC breakfast  . mouth rinse  15 mL Mouth Rinse q12n4p  . metoprolol tartrate  25 mg Per Tube BID  . piperacillin-tazobactam (ZOSYN)  IV  3.375 g Intravenous Q8H   Continuous Infusions: . dextrose 5 % and 0.45 % NaCl with KCl 20 mEq/L 75 mL/hr at 01/22/16 2255  . feeding supplement (VITAL AF 1.2 CAL) Stopped (01/23/16 0126)   PRN Meds:.acetaminophen **OR** acetaminophen (TYLENOL) oral liquid 160 mg/5 mL **OR** acetaminophen, hydrALAZINE, levalbuterol, LORazepam, morphine injection Allergies  Allergen Reactions  . Cardizem [Diltiazem Hcl] Rash   Review of Systems  Unable to perform ROS: Acuity of condition    Physical Exam  Constitutional: He appears well-developed.  HENT:  Head: Normocephalic and atraumatic.  Cardiovascular: An irregularly irregular rhythm present. Tachycardia present.   Pulmonary/Chest: Effort normal and breath sounds normal. No accessory muscle usage. No tachypnea. No respiratory distress.  Abdominal: Soft. Normal appearance.  Neurological: He is alert.  Orientation waxing/waning.   Nursing note and vitals reviewed.   Vital Signs: BP (!) 135/96   Pulse 93   Temp 98.2 F (36.8 C) (Axillary)   Resp (!) 23   Ht _0  (1.854 m)   Wt 83.6 kg (184 lb 4.9 oz)   SpO2 97%   BMI 24.32 kg/m  Pain Assessment: No/denies pain POSS *See Group Information*: 1-Acceptable,Awake and alert Pain Score: 0-No pain   SpO2: SpO2: 97 % O2 Device:SpO2: 97 % O2 Flow Rate: .O2 Flow Rate (L/min): 2 L/min  IO: Intake/output summary:  Intake/Output Summary (Last 24 hours) at 01/23/16 1837 Last data filed at 01/23/16 1800  Gross per 24 hour  Intake          2421.67 ml  Output              900 ml  Net          1521.67 ml    LBM: Last BM Date: 01/15/16 (Pt NPO) Baseline Weight: Weight: 87.2 kg (192 lb 5 oz) Most recent weight: Weight: 83.6 kg (184 lb 4.9 oz)      Palliative Assessment/Data:   Flowsheet Rows   Flowsheet Row Most Recent Value  Intake Tab  Referral Department  Hospitalist  Unit at Time of Referral  Intermediate Care Unit  Palliative Care Primary Diagnosis  Neurology  Date Notified  01/21/16  Palliative Care Type  New Palliative care  Reason for referral  Clarify Goals  of Care  Date of Admission  01/22/2016  # of days IP prior to Palliative referral  8  Clinical Assessment  Psychosocial & Spiritual Assessment  Palliative Care Outcomes      Time In: 1400 Time Out: 1530 Time Total: 60mn Greater than 50%  of this time was spent counseling and coordinating care related to the above assessment and plan.  Signed by: AVinie Sill NP Palliative Medicine Team Pager # [phone](M-F 8a-5p) Team Phone # [phone](Nights/Weekends)

## 2016-01-23 NOTE — Evaluation (Signed)
Clinical/Bedside Swallow Evaluation Patient Details  Name: Jerry [name] MRN: [id number] Date of Birth: Dec 20, 1931  Today's Date: 01/23/2016 Time: SLP Start Time (ACUTE ONLY): [id number] SLP Stop Time (ACUTE ONLY): 0826 SLP Time Calculation (min) (ACUTE ONLY): 15 min  Past Medical History:  Past Medical History:  Diagnosis Date  . Dyslipidemia   . Gout   . HTN (hypertension)   . Hypothyroidism   . Paroxysmal atrial fibrillation [facility](HCC)    Past Surgical History:  Past Surgical History:  Procedure Laterality Date  . TONSILLECTOMY AND ADENOIDECTOMY  AGE 83   HPI:  Pt presents with R Ischemic MCA Infarct. Pt with hx of A-fib, HTN, and Gout.     Assessment / Plan / Recommendation Clinical Impression  Repeat clinical swallowing evaluation was completed using ice chips and thin liquids.  Oral mechanism exam was completed and remarkable for left side facial, labial and lingual weakness/droop and decreased range of motion.  Of note the patient had an MBS dated 01/26/2016 with recommendation for him to remain NPO.  Currently the patient has no  source of nutrition.  Given PO's the patient was noted to have a delayed swallow trigger and decreased hyo-laryngeal excursion.  Immediate strong cough response was noted post swallow.  Given current clinical presentation and results of most recent MBS the safest diet is NPO with repeat MBS to determine current swallowing physiology and least restrictive diet.      Aspiration Risk  Moderate aspiration risk    Diet Recommendation   NPO pending repeat MBS  Medication Administration: Via alternative means    Other  Recommendations Oral Care Recommendations: Oral care QID Other Recommendations: Remove water pitcher;Have oral suction available   Follow up Recommendations Inpatient Rehab      Frequency and Duration min 2x/week  2 weeks       Prognosis Prognosis for Safe Diet Advancement: Fair Barriers to Reach Goals: Cognitive deficits       Swallow Study   General Date of Onset: 02/02/2016 HPI: Pt presents with R Ischemic MCA Infarct. Pt with hx of A-fib, HTN, and Gout.   Type of Study: Bedside Swallow Evaluation Previous Swallow Assessment: Previous work up during this admission. Diet Prior to this Study: NPO Temperature Spikes Noted: Yes Respiratory Status: Nasal cannula History of Recent Intubation: No Behavior/Cognition: Cooperative;Requires cueing;Confused Oral Cavity Assessment: Dry Oral Care Completed by SLP: Yes Oral Cavity - Dentition: Adequate natural dentition Self-Feeding Abilities: Total assist Patient Positioning: Upright in bed Baseline Vocal Quality: Low vocal intensity Volitional Cough: Strong Volitional Swallow: Able to elicit    Oral/Motor/Sensory Function Overall Oral Motor/Sensory Function: Moderate impairment Facial ROM: Reduced left;Suspected CN VII (facial) dysfunction Facial Symmetry: Abnormal symmetry left;Suspected CN VII (facial) dysfunction Facial Strength: Reduced left;Suspected CN VII (facial) dysfunction Lingual ROM: Reduced left;Suspected CN XII (hypoglossal) dysfunction Lingual Symmetry: Abnormal symmetry left;Suspected CN XII (hypoglossal) dysfunction Lingual Strength:  (Unable to assess)   Ice Chips Ice chips: Impaired Presentation: Spoon Pharyngeal Phase Impairments: Decreased hyoid-laryngeal movement;Cough - Immediate   Thin Liquid Thin Liquid: Impaired Presentation: Spoon Oral Phase Impairments: Reduced labial seal Pharyngeal  Phase Impairments: Suspected delayed Swallow;Decreased hyoid-laryngeal movement;Cough - Immediate    Nectar Thick Nectar Thick Liquid: Not tested   Honey Thick Honey Thick Liquid: Not tested   Puree Puree: Not tested   Solid   GO   Solid: Not tested        Dimas AguasMelissa Ester Mabe, MA, CCC-SLP Acute Rehab SLP [phone] Dimas AguasGoodman, Jeanne Terrance N 01/23/2016,8:37 AM

## 2016-01-23 NOTE — Progress Notes (Signed)
   01/23/16 0900  Clinical Encounter Type  Visited With Patient  Visit Type Spiritual support  Spiritual Encounters  Spiritual Needs Emotional  Stress Factors  Patient Stress Factors None identified  Received page that Pt end of Life. Pt concerned about wife coming to see him. Nursing staff stated that Pt's wife usually comes to visit and is probably on the way. Gave Pt a blessing.

## 2016-01-23 NOTE — Progress Notes (Signed)
Pharmacy Antibiotic Note Jerry [name] is a 83 y.o. male admitted on 01/27/2016. Currently on day 6 of Zosyn for coverage of aspiration pneumonia.   Plan: 1. Continue EI Zosyn 3.375 grams IV every 8 hours  Height: 6\' 1"  (185.4 cm) Weight: 184 lb 4.9 oz (83.6 kg) IBW/kg (Calculated) : 79.9  Temp (24hrs), Avg:97.8 F (36.6 C), Min:97.2 F (36.2 C), Max:99.6 F (37.6 C)   Recent Labs Lab 01/17/16 0000 01/18/16 0510 01/18/16 0659 01/19/16 0149 01/21/16 0855 01/22/16 0221 01/22/16 1456 01/23/16 0353  WBC  --  13.8*  --  19.1*  --  11.9*  --  11.7*  CREATININE 0.66 0.67  --   --   --  1.19 1.24 1.34*  LATICACIDVEN  --   --  1.3  --   --   --   --   --   VANCOTROUGH  --   --   --   --  16  --   --   --     Estimated Creatinine Clearance: 48 mL/min (by C-G formula based on SCr of 1.34 mg/dL (H)).    Allergies  Allergen Reactions  . Cardizem [Diltiazem Hcl] Rash    Antimicrobials this admission:  Vancomycin 1/7>> 1/10 Unasyn 1/4 >>1/7   Zosyn 1/7>>  Dose adjustments this admission:   Microbiology results:  1/7 BCx: ngtd 1/4 BCx: ngF 1/2 MRSA PCR: neg  Pollyann SamplesAndy [place] Espina, PharmD, BCPS 01/23/2016, 8:15 AM Pager: [phone]

## 2016-01-23 NOTE — Progress Notes (Signed)
PROGRESS NOTE    Jerry FewHarold D Paluch  AVW:[id number] DOB: 1931-03-15 DOA: 02/02/2016 PCP: Ezequiel KayserPERINI,MARK A, MD   Brief Narrative:  83 y.o. male who presents with acute onset of left facial droop with LUE and LLE weakness. Pt with R MCA stroke and Neurology on board.  Hospital stay has been complicated by dysphagia 2ary to stroke, aspiration, and atrial fibrillation. Prognosis seems poor and patient's conditions seems to be deteriorating.   Assessment & Plan:     Acute ischemic right MCA stroke [facility](HCC) - Neurology on board and signed off on 1/6 - Pt with depressed mental status, repeat CT scan on 1/7 and reported: Changes consistent with evolving right middle cerebral artery infarct. No significant hemorrhagic component is noted. Previously seen petechial hemorrhages are again noted. Per neurology note initially "-Xarelto (rivaroxaban) daily prior to admission, now on aspirin 300 mg suppository daily due to large infarct. Consider to switch to eliquis in one week. " -hdl 69, resume home meds statin and fish oil when able to take oral meds --on 1/10, he talked and started to move his left side, but per RN , there is no gag reflex when she tried to insert feeding tube,  -feeding tube dislodged several times, last placed on 1/12, currently on eliquis per neurology recommendation. -patient has a very large stroke, not sure how much he can recover from it ( per wife, patient was very active and goes to the gyn regularly prior to the stroke) Wife also agreed to talk to palliative care   Aspiration pneumonia: acute hypoxic respiratory failure , required bipap cxr on 1/7 "Opacity and effusion in the right base, likely pneumonia in the setting of fever. Followup PA and lateral chest X-ray is recommended in 3-4 weeks following trial of antibiotic therapy to ensure resolution and exclude underlying malignancy." - he required bipap , now on nasal cannula, seems improving on abx, repeat cxr on 1/11 "Diffuse  right lung infiltrate with partial clearing from prior exam. Findings consistent with pneumonia" - keep head of bed elevated, continue aspiration precaution   chronic ATRIAL FIBRILLATION (h/o Allergic to Cardizem) - on xarelto /dig at home -He has been on rectal aspirin due to npo, transitioned to elliquis on 1/11 per neurology recommendation -he was on iv lopressor and amiodarone drip due to npo, now has feeding tube, transition meds to oral -continue adjust meds to achieve rate control     Essential hypertension - he was on permissive hypertension -Need to gradually normalized blood pressure, currently on B blocker  Hypernatremia: likely from dehydration, continue ivf, now feeding tube placed in, free water through feeding tube as well, repeat bmp  Hypokalemia - replacing enterally. Check mag    Hypothyroidism - stable continue synthroid   Nutrition:   tube feeds, nutrition following   DVT prophylaxis: was on Lovenox, started eliquis on 1/11 Code Status: DNR Family Communication: wife at bedside Disposition Plan: remain in stepdown unit.   Consultants:   Neurology  Critical care  Palliative care  Inpatient rehab   Procedures: None   Antimicrobials: zosyn  Subjective: Patient was awake this am, he knows it is 2018, he knows he is at [place], he follows commands, he agreed to reinsert feeding tube Has watery stool with strong odor in rectal tube No fever, denies pain,. Condom catheter inplace, with clear urine,    Objective: Vitals:   01/22/16 2345 01/23/16 0300 01/23/16 0430 01/23/16 0800  BP: 122/70 (!) 145/101 129/77 133/85  Pulse: 66 72  (!)  108  Resp: (!) 25 (!) 22  (!) 27  Temp: 97.4 F (36.3 C) 97.6 F (36.4 C)  97.5 F (36.4 C)  TempSrc: Axillary Axillary  Oral  SpO2: 95% 97% 95% 100%  Weight:  83.6 kg (184 lb 4.9 oz)    Height:        Intake/Output Summary (Last 24 hours) at 01/23/16 0920 Last data filed at 01/23/16 4098  Gross  per 24 hour  Intake          2495.72 ml  Output             1350 ml  Net          1145.72 ml   Filed Weights   01/21/16 0500 01/22/16 0500 01/23/16 0300  Weight: 84.1 kg (185 lb 6.5 oz) 84.5 kg (186 lb 4.6 oz) 83.6 kg (184 lb 4.9 oz)    Examination:  General exam: awake Respiratory system: upper airway sounds, no wheezing Cardiovascular system: IRRR Gastrointestinal system: Abdomen is nondistended, soft and nontender. No organomegaly or masses felt. Normal bowel sounds heard. Central nervous system: awake, talking, following commands, moving right side spontaneously Extremities: right toe cyanosis, no clubbing Skin: No rashes, lesions or ulcers, on limited exam. Psychiatry: unable to assess due to depressed consciousness  Data Reviewed: I have personally reviewed following labs and imaging studies  CBC:  Recent Labs Lab 01/18/16 0510 01/19/16 0149 01/22/16 0221 01/23/16 0353  WBC 13.8* 19.1* 11.9* 11.7*  NEUTROABS  --  15.5* 9.2* 9.1*  HGB 13.5 14.7 13.8 13.5  HCT 40.1 43.7 42.6 40.9  MCV 99.0 100.7* 102.4* 100.2*  PLT 230 295 262 231   Basic Metabolic Panel:  Recent Labs Lab 01/16/16 1617 01/17/16 0000 01/18/16 0510 01/22/16 0221 01/22/16 1456 01/23/16 0353  NA  --  140 144 156* 153* 155*  K  --  3.0* 3.8 3.2* 3.3* 2.8*  CL  --  106 109 121* 122* 119*  CO2  --  24 27 26 24 26   GLUCOSE  --  167* 162* 135* 172* 173*  BUN  --  19 18 36* 37* 38*  CREATININE  --  0.66 0.67 1.19 1.24 1.34*  CALCIUM  --  8.4* 8.5* 8.4* 8.1* 8.2*  MG 2.3  --   --  2.6*  --  2.5*  PHOS 1.3*  --   --  3.0  --  1.8*   GFR: Estimated Creatinine Clearance: 48 mL/min (by C-G formula based on SCr of 1.34 mg/dL (H)). Liver Function Tests:  Recent Labs Lab 01/23/16 0353  AST 70*  ALT 123*  ALKPHOS 58  BILITOT 2.2*  PROT 5.5*  ALBUMIN 2.2*   No results for input(s): LIPASE, AMYLASE in the last 168 hours. No results for input(s): AMMONIA in the last 168 hours. Coagulation  Profile: No results for input(s): INR, PROTIME in the last 168 hours. Cardiac Enzymes: No results for input(s): CKTOTAL, CKMB, CKMBINDEX, TROPONINI in the last 168 hours. BNP (last 3 results) No results for input(s): PROBNP in the last 8760 hours. HbA1C: No results for input(s): HGBA1C in the last 72 hours. CBG:  Recent Labs Lab 01/21/16 1206 01/21/16 1651 01/21/16 2028 01/22/16 0015 01/22/16 0344  GLUCAP 175* 110* 131* 125* 130*   Lipid Profile: No results for input(s): CHOL, HDL, LDLCALC, TRIG, CHOLHDL, LDLDIRECT in the last 72 hours. Thyroid Function Tests: No results for input(s): TSH, T4TOTAL, FREET4, T3FREE, THYROIDAB in the last 72 hours. Anemia Panel: No results for input(s): VITAMINB12,  FOLATE, FERRITIN, TIBC, IRON, RETICCTPCT in the last 72 hours. Sepsis Labs:  Recent Labs Lab 01/18/16 0659  LATICACIDVEN 1.3    Recent Results (from the past 240 hour(s))  Culture, blood (Routine X 2) w Reflex to ID Panel     Status: None   Collection Time: 01/15/16  4:40 AM  Result Value Ref Range Status   Specimen Description BLOOD RIGHT ARM  Final   Special Requests IN PEDIATRIC BOTTLE 3CC  Final   Culture NO GROWTH 5 DAYS  Final   Report Status 01/20/2016 FINAL  Final  Culture, blood (Routine X 2) w Reflex to ID Panel     Status: None   Collection Time: 01/15/16  4:50 AM  Result Value Ref Range Status   Specimen Description BLOOD RIGHT HAND  Final   Special Requests BOTTLES DRAWN AEROBIC ONLY 7CC  Final   Culture NO GROWTH 5 DAYS  Final   Report Status 01/20/2016 FINAL  Final  Culture, blood (routine x 2)     Status: None (Preliminary result)   Collection Time: 01/18/16  6:59 AM  Result Value Ref Range Status   Specimen Description BLOOD LEFT ANTECUBITAL  Final   Special Requests BOTTLES DRAWN AEROBIC ONLY  5CC  Final   Culture NO GROWTH 4 DAYS  Final   Report Status PENDING  Incomplete  Culture, blood (routine x 2)     Status: None (Preliminary result)   Collection  Time: 01/18/16  7:04 AM  Result Value Ref Range Status   Specimen Description BLOOD LEFT HAND  Final   Special Requests BOTTLES DRAWN AEROBIC AND ANAEROBIC  5CC  Final   Culture NO GROWTH 4 DAYS  Final   Report Status PENDING  Incomplete         Radiology Studies: Dg Chest Port 1 View  Result Date: 01/22/2016 CLINICAL DATA:  Pneumonia. EXAM: PORTABLE CHEST 1 VIEW COMPARISON:  01/18/2016 . FINDINGS: Feeding tube noted with tip below left hemidiaphragm. Cardiomegaly with normal pulmonary vascularity. Diffuse right lung infiltrate noted consistent with pneumonia. Slight clearing from prior exam. Left lung is clear on today's exam. No pleural effusion or pneumothorax . IMPRESSION: 1.  Feeding tube noted with tip below left hemidiaphragm. 2. Diffuse right lung infiltrate with partial clearing from prior exam. Findings consistent with pneumonia. 3.  Stable cardiomegaly . Electronically Signed   By: Maisie Fus  Register   On: 01/22/2016 15:33   Dg Abd Portable 1v  Result Date: 01/23/2016 CLINICAL DATA:  Feeding tube placement EXAM: PORTABLE ABDOMEN - 1 VIEW COMPARISON:  01/22/2016 FINDINGS: Enteric tube tip is localized at the level of the EG junction, likely in the distal esophagus. Visualized bowel are not abnormally distended. IMPRESSION: Enteric tube tip localizes to the EG junction, likely in the distal esophagus. Electronically Signed   By: Burman Nieves M.D.   On: 01/23/2016 02:08   Dg Abd Portable 1v  Result Date: 01/22/2016 CLINICAL DATA:  Feeding tube placement EXAM: PORTABLE ABDOMEN - 1 VIEW COMPARISON:  01/21/2016 FINDINGS: Feeding tube is in place with the tip likely in the distal stomach. Nonobstructive bowel gas pattern. IMPRESSION: Feeding tube tip likely in the distal stomach. Electronically Signed   By: Charlett Nose M.D.   On: 01/22/2016 09:54   Dg Abd Portable 1v  Result Date: 01/21/2016 CLINICAL DATA:  Advancement of feeding tube EXAM: PORTABLE ABDOMEN - 1 VIEW COMPARISON:   Radiograph 01/21/2016 at 1501 hours, CT 01/14/2016 FINDINGS: Feeding tube is unchanged in position from comparison exam.  Tube is in the course of the LEFT mainstem bronchus as well as the esophagus on comparison CT. Cannot exclude intubation of the LEFT mainstem bronchus. IMPRESSION: No change in position of the NG tube. Cannot exclude intubation of the LEFT mainstem bronchus. Findings conveyed toTroyce, RN on 01/21/2016  at16:23. Electronically Signed   By: Genevive Bi M.D.   On: 01/21/2016 16:24   Dg Abd Portable 1v  Result Date: 01/21/2016 CLINICAL DATA:  Feeding tube placement EXAM: PORTABLE ABDOMEN - 1 VIEW COMPARISON:  Portable exam 1500 hours AP ate at 1501 hours compared to 01/17/2016 and correlated with CT chest of 01/14/2016 FINDINGS: Tip of nasogastric tube projects LEFT paraspinal at the inferior chest, projecting over the LEFT heart. This appears to be within a tortuous esophagus and follows the course of a feeding tube seen on a prior CT chest exam. Proximal side-port is not visualized. This would need advanced 11 cm to place tip in the stomach, uncertain as to distance required to place proximal side-port in the stomach since is not visualized. Atelectasis versus consolidation LEFT lower lobe. RIGHT lung base clear. Visualized bowel gas pattern normal. Degenerative changes and osseous demineralization of the thoracolumbar spine. IMPRESSION: Tip of nasogastric tube projects LEFT paraspinal at the level of the cardiac silhouette, corresponding to the course of the feeding tube identified on a prior CT exam, recommend advancing tube in the stomach as above. Findings called to charge nurse on [facility] [phone]) on 01/21/2016 at 1519 hours. Electronically Signed   By: Ulyses Southward M.D.   On: 01/21/2016 15:19    Scheduled Meds: . alfuzosin  10 mg Oral Daily  . allopurinol  300 mg Oral Daily  . apixaban  5 mg Oral BID  . atorvastatin  40 mg Oral q1800  . chlorhexidine  15 mL Mouth Rinse BID    . digoxin  0.125 mg Oral Daily  . free water  200 mL Per Tube Q4H  . insulin aspart  0-9 Units Subcutaneous TID WC  . lactobacillus  1 g Per Tube TID WC  . levothyroxine  100 mcg Oral QAC breakfast  . mouth rinse  15 mL Mouth Rinse q12n4p  . metoprolol tartrate  12.5 mg Per Tube BID  . piperacillin-tazobactam (ZOSYN)  IV  3.375 g Intravenous Q8H  . potassium chloride  40 mEq Per Tube Q4H   Continuous Infusions: . dextrose 5 % and 0.45 % NaCl with KCl 20 mEq/L 75 mL/hr at 01/22/16 2255  . feeding supplement (VITAL AF 1.2 CAL) Stopped (01/23/16 0126)     LOS: 10 days    Time spent: > 35 minutes   Shivon Hackel, MD PhD [facility] Pager [phone]  If 7PM-7AM, please contact night-coverage [email or web address] Password [email or web address] 01/23/2016, 9:20 AM

## 2016-01-23 NOTE — Progress Notes (Signed)
Patient's feeding tube is at the GE junction/distal esphagus.  Instructed to advance 2 cm.  During attempt, tube visibly coiled in patient's mouth.  After that, the patient began to cough with manipulation of the tube and says he feels it in the back of his throat; even with patient swallowing cooperatively.  Tube removed since it could not be properly advanced and NP updated.

## 2016-01-23 NOTE — Progress Notes (Signed)
Wife is very concerned that patient has not rested today.  He is very anxious.  Text message sent to Dr. Roda ShuttersXu.  New orders noted.

## 2016-01-23 NOTE — Progress Notes (Signed)
Dr. Roda ShuttersXu called and stated that cortrak is goo to use.

## 2016-01-23 NOTE — Progress Notes (Signed)
Upon assessment, patient's cortrak feeding tube was out further than at first and tape was undone.  Tube feeding on pause. Advanced feeding tube by 10 cm at original position and notified attending.  Will obtain xray confirmation of tube's position.

## 2016-01-24 ENCOUNTER — Inpatient Hospital Stay (HOSPITAL_COMMUNITY): Payer: Medicare Other

## 2016-01-24 LAB — BASIC METABOLIC PANEL
Anion gap: 11 (ref 5–15)
BUN: 30 mg/dL — AB (ref 6–20)
CHLORIDE: 120 mmol/L — AB (ref 101–111)
CO2: 23 mmol/L (ref 22–32)
CREATININE: 1.26 mg/dL — AB (ref 0.61–1.24)
Calcium: 8.4 mg/dL — ABNORMAL LOW (ref 8.9–10.3)
GFR calc Af Amer: 60 mL/min — ABNORMAL LOW (ref >60)
GFR calc non Af Amer: 51 mL/min — ABNORMAL LOW (ref >60)
GLUCOSE: 198 mg/dL — AB (ref 65–99)
Potassium: 3.3 mmol/L — ABNORMAL LOW (ref 3.5–5.1)
Sodium: 154 mmol/L — ABNORMAL HIGH (ref 135–145)

## 2016-01-24 LAB — GLUCOSE, CAPILLARY
GLUCOSE-CAPILLARY: 136 mg/dL — AB (ref 65–99)
GLUCOSE-CAPILLARY: 135 mg/dL — AB (ref 65–99)
Glucose-Capillary: 189 mg/dL — ABNORMAL HIGH (ref 65–99)
Glucose-Capillary: 173 mg/dL — ABNORMAL HIGH (ref 65–99)
Glucose-Capillary: 170 mg/dL — ABNORMAL HIGH (ref 65–99)

## 2016-01-24 LAB — HEPATIC FUNCTION PANEL
ALBUMIN: 2.5 g/dL — AB (ref 3.5–5.0)
ALK PHOS: 70 U/L (ref 38–126)
ALT: 100 U/L — AB (ref 17–63)
AST: 62 U/L — ABNORMAL HIGH (ref 15–41)
BILIRUBIN TOTAL: 1.5 mg/dL — AB (ref 0.3–1.2)
Bilirubin, Direct: 0.4 mg/dL (ref 0.1–0.5)
Indirect Bilirubin: 1.1 mg/dL — ABNORMAL HIGH (ref 0.3–0.9)
Total Protein: 5.9 g/dL — ABNORMAL LOW (ref 6.5–8.1)

## 2016-01-24 LAB — CULTURE, BLOOD (ROUTINE X 2)
CULTURE: NO GROWTH
Culture: NO GROWTH

## 2016-01-24 LAB — CBC
HEMATOCRIT: 42.6 % (ref 39.0–52.0)
HEMOGLOBIN: 14.3 g/dL (ref 13.0–17.0)
MCH: 33.5 pg (ref 26.0–34.0)
MCHC: 33.6 g/dL (ref 30.0–36.0)
MCV: 99.8 fL (ref 78.0–100.0)
Platelets: 251 10*3/uL (ref 150–400)
RBC: 4.27 MIL/uL (ref 4.22–5.81)
RDW: 14.9 % (ref 11.5–15.5)
WBC: 14.4 10*3/uL — ABNORMAL HIGH (ref 4.0–10.5)

## 2016-01-24 LAB — PHOSPHORUS: Phosphorus: 3 mg/dL (ref 2.5–4.6)

## 2016-01-24 LAB — MAGNESIUM: Magnesium: 2.3 mg/dL (ref 1.7–2.4)

## 2016-01-24 MED ORDER — POTASSIUM CHLORIDE 20 MEQ/15ML (10%) PO SOLN
40.0000 meq | Freq: Once | ORAL | Status: AC
  Administered 2016-01-24: 40 meq
  Filled 2016-01-24: qty 30

## 2016-01-24 MED ORDER — METOPROLOL TARTRATE 50 MG PO TABS
50.0000 mg | ORAL_TABLET | Freq: Two times a day (BID) | ORAL | Status: DC
Start: 2016-01-24 — End: 2016-01-25
  Administered 2016-01-24 – 2016-01-25 (×3): 50 mg
  Filled 2016-01-24 (×3): qty 1

## 2016-01-24 MED ORDER — FREE WATER
200.0000 mL | Status: AC
  Administered 2016-01-24 – 2016-01-25 (×9): 200 mL

## 2016-01-24 NOTE — Progress Notes (Addendum)
PROGRESS NOTE    Jerry [name]  ZOX:[id number] DOB: Feb 12, 1931 DOA: Feb 05, 2016 PCP: Ezequiel Kayser, MD   Brief Narrative:  83 y.o. male who presents with acute onset of left facial droop with LUE and LLE weakness. Pt with R MCA stroke and Neurology on board.  Hospital stay has been complicated by dysphagia 2ary to stroke, aspiration, and atrial fibrillation. Palliative care following   Assessment & Plan:     Acute ischemic right MCA stroke [facility]) - Neurology on board and signed off on 1/6 - Pt with depressed mental status, repeat CT scan on 1/7 and reported: Changes consistent with evolving right middle cerebral artery infarct. No significant hemorrhagic component is noted. Previously seen petechial hemorrhages are again noted. Per neurology note initially "-Xarelto (rivaroxaban) daily prior to admission, now on aspirin 300 mg suppository daily due to large infarct. Consider to switch to eliquis in one week. " -hdl 69, resume home meds statin and fish oil when able to take oral meds --on 1/10, he talked and started to move his left side, but per RN , there is no gag reflex when she tried to insert feeding tube,  -feeding tube dislodged several times, last placed on 1/12, currently on eliquis per neurology recommendation. -patient has a very large stroke, not sure how much he can recover from it ( per wife, patient was very active and goes to the gyn regularly prior to the stroke) Wife also agreed to talk to palliative care   Aspiration pneumonia: acute hypoxic respiratory failure , required bipap cxr on 1/7 "Opacity and effusion in the right base, likely pneumonia in the setting of fever. Followup PA and lateral chest X-ray is recommended in 3-4 weeks following trial of antibiotic therapy to ensure resolution and exclude underlying malignancy." - he required bipap , now on nasal cannula, seems improving on abx, repeat cxr on 1/11 "Diffuse right lung infiltrate with partial clearing  from prior exam. Findings consistent with pneumonia" - keep head of bed elevated, continue aspiration precaution   chronic ATRIAL FIBRILLATION (h/o Allergic to Cardizem) - on xarelto /dig at home -He has been on rectal aspirin due to npo, transitioned to elliquis on 1/11 per neurology recommendation -he was on iv lopressor and amiodarone drip due to npo, now has feeding tube, transition meds to oral -continue dig, increase lopressor, continue adjust meds to achieve rate control      Essential hypertension - he was on permissive hypertension -Need to gradually normalized blood pressure, currently on B blocker  Hypernatremia: likely from dehydration,  now feeding tube placed in, free water through feeding tube, repeat bmp  Hypokalemia - replacing enterally. Check mag    Hypothyroidism - stable continue synthroid   Nutrition:   tube feeds, nutrition following Feeding tube clogged on 1/13, has to replace again on 1/13  DVT prophylaxis: was on Lovenox, started eliquis on 1/11 Code Status: DNR Family Communication: wife at bedside Disposition Plan: remain in stepdown unit.   Consultants:   Neurology  Critical care  Palliative care  Inpatient rehab   Procedures: None   Antimicrobials:unasyn then  zosyn  Subjective: Patient was awake this am,  he follows commands, left neglect, I did not elicit any movement on the left today He is on two liter oxygen, On tube feeds, rectal tube, condom catheter in place, right hand with wrist restrain and has mitten on  bp elevated, still tachycardic, No fever, denies pain,    Objective: Vitals:   01/23/16 2331 01/24/16 0000  01/24/16 0300 01/24/16 0723  BP:  (!) 119/103 (!) 157/135 (!) 153/119  Pulse:  88 76 (!) 113  Resp:  (!) 41 (!) 31 (!) 25  Temp: 97.4 F (36.3 C)  97.5 F (36.4 C)   TempSrc: Oral  Axillary   SpO2:  96% 96% 97%  Weight:   84.2 kg (185 lb 10 oz)   Height:        Intake/Output Summary (Last 24 hours)  at 01/24/16 0756 Last data filed at 01/24/16 0600  Gross per 24 hour  Intake             2225 ml  Output             1350 ml  Net              875 ml   Filed Weights   01/22/16 0500 01/23/16 0300 01/24/16 0300  Weight: 84.5 kg (186 lb 4.6 oz) 83.6 kg (184 lb 4.9 oz) 84.2 kg (185 lb 10 oz)    Examination:  General exam: awake, following command, very frail Respiratory system: upper airway sounds, no wheezing Cardiovascular system: IRRR Gastrointestinal system: Abdomen is nondistended, soft and nontender. No organomegaly or masses felt. Normal bowel sounds heard. Central nervous system: awake, talking, following commands, moving right side spontaneously, left neglect , left hemiparesis, left facial droop, tongue protrude to the right. Extremities:  no clubbing, no edema Skin: No rashes, lesions or ulcers, on limited exam. Psychiatry: unable to assess due to depressed consciousness  Data Reviewed: I have personally reviewed following labs and imaging studies  CBC:  Recent Labs Lab 01/18/16 0510 01/19/16 0149 01/22/16 0221 01/23/16 0353 01/24/16 0332  WBC 13.8* 19.1* 11.9* 11.7* 14.4*  NEUTROABS  --  15.5* 9.2* 9.1*  --   HGB 13.5 14.7 13.8 13.5 14.3  HCT 40.1 43.7 42.6 40.9 42.6  MCV 99.0 100.7* 102.4* 100.2* 99.8  PLT 230 295 262 231 251   Basic Metabolic Panel:  Recent Labs Lab 01/18/16 0510 01/22/16 0221 01/22/16 1456 01/23/16 0353  NA 144 156* 153* 155*  K 3.8 3.2* 3.3* 2.8*  CL 109 121* 122* 119*  CO2 27 26 24 26   GLUCOSE 162* 135* 172* 173*  BUN 18 36* 37* 38*  CREATININE 0.67 1.19 1.24 1.34*  CALCIUM 8.5* 8.4* 8.1* 8.2*  MG  --  2.6*  --  2.5*  PHOS  --  3.0  --  1.8*   GFR: Estimated Creatinine Clearance: 48 mL/min (by C-G formula based on SCr of 1.34 mg/dL (H)). Liver Function Tests:  Recent Labs Lab 01/23/16 0353  AST 70*  ALT 123*  ALKPHOS 58  BILITOT 2.2*  PROT 5.5*  ALBUMIN 2.2*   No results for input(s): LIPASE, AMYLASE in the last  168 hours. No results for input(s): AMMONIA in the last 168 hours. Coagulation Profile: No results for input(s): INR, PROTIME in the last 168 hours. Cardiac Enzymes: No results for input(s): CKTOTAL, CKMB, CKMBINDEX, TROPONINI in the last 168 hours. BNP (last 3 results) No results for input(s): PROBNP in the last 8760 hours. HbA1C: No results for input(s): HGBA1C in the last 72 hours. CBG:  Recent Labs Lab 01/23/16 1151 01/23/16 1621 01/23/16 1938 01/23/16 2330 01/24/16 0314  GLUCAP 129* 119* 142* 157* 189*   Lipid Profile: No results for input(s): CHOL, HDL, LDLCALC, TRIG, CHOLHDL, LDLDIRECT in the last 72 hours. Thyroid Function Tests: No results for input(s): TSH, T4TOTAL, FREET4, T3FREE, THYROIDAB in the last 72 hours. Anemia  Panel: No results for input(s): VITAMINB12, FOLATE, FERRITIN, TIBC, IRON, RETICCTPCT in the last 72 hours. Sepsis Labs:  Recent Labs Lab 01/18/16 0659  LATICACIDVEN 1.3    Recent Results (from the past 240 hour(s))  Culture, blood (Routine X 2) w Reflex to ID Panel     Status: None   Collection Time: 01/15/16  4:40 AM  Result Value Ref Range Status   Specimen Description BLOOD RIGHT ARM  Final   Special Requests IN PEDIATRIC BOTTLE 3CC  Final   Culture NO GROWTH 5 DAYS  Final   Report Status 01/20/2016 FINAL  Final  Culture, blood (Routine X 2) w Reflex to ID Panel     Status: None   Collection Time: 01/15/16  4:50 AM  Result Value Ref Range Status   Specimen Description BLOOD RIGHT HAND  Final   Special Requests BOTTLES DRAWN AEROBIC ONLY 7CC  Final   Culture NO GROWTH 5 DAYS  Final   Report Status 01/20/2016 FINAL  Final  Culture, blood (routine x 2)     Status: None (Preliminary result)   Collection Time: 01/18/16  6:59 AM  Result Value Ref Range Status   Specimen Description BLOOD LEFT ANTECUBITAL  Final   Special Requests BOTTLES DRAWN AEROBIC ONLY  5CC  Final   Culture NO GROWTH 4 DAYS  Final   Report Status PENDING  Incomplete    Culture, blood (routine x 2)     Status: None (Preliminary result)   Collection Time: 01/18/16  7:04 AM  Result Value Ref Range Status   Specimen Description BLOOD LEFT HAND  Final   Special Requests BOTTLES DRAWN AEROBIC AND ANAEROBIC  5CC  Final   Culture NO GROWTH 4 DAYS  Final   Report Status PENDING  Incomplete  C difficile quick scan w PCR reflex     Status: None   Collection Time: 01/23/16  9:20 AM  Result Value Ref Range Status   C Diff antigen NEGATIVE NEGATIVE Final   C Diff toxin NEGATIVE NEGATIVE Final   C Diff interpretation No C. difficile detected.  Final         Radiology Studies: Dg Chest Port 1 View  Result Date: 01/22/2016 CLINICAL DATA:  Pneumonia. EXAM: PORTABLE CHEST 1 VIEW COMPARISON:  01/18/2016 . FINDINGS: Feeding tube noted with tip below left hemidiaphragm. Cardiomegaly with normal pulmonary vascularity. Diffuse right lung infiltrate noted consistent with pneumonia. Slight clearing from prior exam. Left lung is clear on today's exam. No pleural effusion or pneumothorax . IMPRESSION: 1.  Feeding tube noted with tip below left hemidiaphragm. 2. Diffuse right lung infiltrate with partial clearing from prior exam. Findings consistent with pneumonia. 3.  Stable cardiomegaly . Electronically Signed   By: Maisie Fus  Register   On: 01/22/2016 15:33   Dg Abd Portable 1v  Result Date: 01/23/2016 CLINICAL DATA:  Feeding tube placement. EXAM: PORTABLE ABDOMEN - 1 VIEW COMPARISON:  Earlier today. FINDINGS: The feeding tube has been advanced. It extends to the gastric pylorus and is folded back upon itself with its tip in the proximal stomach. Normal bowel gas pattern. Lumbar and lower thoracic spine degenerative changes. IMPRESSION: Feeding tube extending to the gastric pylorus and folded back upon itself with its tip in the proximal stomach. Electronically Signed   By: Beckie Salts M.D.   On: 01/23/2016 12:42   Dg Abd Portable 1v  Result Date: 01/23/2016 CLINICAL DATA:   Feeding tube placement EXAM: PORTABLE ABDOMEN - 1 VIEW COMPARISON:  01/22/2016 FINDINGS:  Enteric tube tip is localized at the level of the EG junction, likely in the distal esophagus. Visualized bowel are not abnormally distended. IMPRESSION: Enteric tube tip localizes to the EG junction, likely in the distal esophagus. Electronically Signed   By: Burman Nieves M.D.   On: 01/23/2016 02:08   Dg Abd Portable 1v  Result Date: 01/22/2016 CLINICAL DATA:  Feeding tube placement EXAM: PORTABLE ABDOMEN - 1 VIEW COMPARISON:  01/21/2016 FINDINGS: Feeding tube is in place with the tip likely in the distal stomach. Nonobstructive bowel gas pattern. IMPRESSION: Feeding tube tip likely in the distal stomach. Electronically Signed   By: Charlett Nose M.D.   On: 01/22/2016 09:54    Scheduled Meds: . allopurinol  300 mg Oral Daily  . apixaban  5 mg Oral BID  . atorvastatin  40 mg Oral q1800  . chlorhexidine  15 mL Mouth Rinse BID  . digoxin  0.125 mg Oral Daily  . insulin aspart  0-9 Units Subcutaneous TID WC  . lactobacillus  1 g Per Tube TID WC  . levothyroxine  100 mcg Oral QAC breakfast  . mouth rinse  15 mL Mouth Rinse q12n4p  . metoprolol tartrate  25 mg Per Tube BID  . piperacillin-tazobactam (ZOSYN)  IV  3.375 g Intravenous Q8H   Continuous Infusions: . dextrose 5 % and 0.45 % NaCl with KCl 20 mEq/L 75 mL/hr at 01/23/16 1855  . feeding supplement (VITAL AF 1.2 CAL) Stopped (01/23/16 0126)     LOS: 11 days    Time spent: > 35 minutes   Emillie Chasen, MD PhD [facility] Pager [phone]  If 7PM-7AM, please contact night-coverage [email or web address] Password [facility] 01/24/2016, 7:56 AM

## 2016-01-25 LAB — BASIC METABOLIC PANEL
ANION GAP: 8 (ref 5–15)
BUN: 28 mg/dL — ABNORMAL HIGH (ref 6–20)
CO2: 24 mmol/L (ref 22–32)
Calcium: 8.3 mg/dL — ABNORMAL LOW (ref 8.9–10.3)
Chloride: 118 mmol/L — ABNORMAL HIGH (ref 101–111)
Creatinine, Ser: 1.22 mg/dL (ref 0.61–1.24)
GFR, EST NON AFRICAN AMERICAN: 53 mL/min — AB (ref >60)
Glucose, Bld: 164 mg/dL — ABNORMAL HIGH (ref 65–99)
POTASSIUM: 3.3 mmol/L — AB (ref 3.5–5.1)
SODIUM: 150 mmol/L — AB (ref 135–145)

## 2016-01-25 LAB — URINALYSIS, ROUTINE W REFLEX MICROSCOPIC
Bilirubin Urine: NEGATIVE
GLUCOSE, UA: NEGATIVE mg/dL
Hgb urine dipstick: NEGATIVE
KETONES UR: NEGATIVE mg/dL
LEUKOCYTES UA: NEGATIVE
Nitrite: NEGATIVE
PH: 6 (ref 5.0–8.0)
Protein, ur: NEGATIVE mg/dL
Specific Gravity, Urine: 1.015 (ref 1.005–1.030)

## 2016-01-25 LAB — CBC
HCT: 41.5 % (ref 39.0–52.0)
Hemoglobin: 13.6 g/dL (ref 13.0–17.0)
MCH: 33 pg (ref 26.0–34.0)
MCHC: 32.8 g/dL (ref 30.0–36.0)
MCV: 100.7 fL — ABNORMAL HIGH (ref 78.0–100.0)
PLATELETS: 271 10*3/uL (ref 150–400)
RBC: 4.12 MIL/uL — ABNORMAL LOW (ref 4.22–5.81)
RDW: 14.7 % (ref 11.5–15.5)
WBC: 15.7 10*3/uL — AB (ref 4.0–10.5)

## 2016-01-25 LAB — GLUCOSE, CAPILLARY
GLUCOSE-CAPILLARY: 155 mg/dL — AB (ref 65–99)
GLUCOSE-CAPILLARY: 155 mg/dL — AB (ref 65–99)
GLUCOSE-CAPILLARY: 163 mg/dL — AB (ref 65–99)
Glucose-Capillary: 152 mg/dL — ABNORMAL HIGH (ref 65–99)
Glucose-Capillary: 128 mg/dL — ABNORMAL HIGH (ref 65–99)

## 2016-01-25 LAB — DIGOXIN LEVEL: DIGOXIN LVL: 0.2 ng/mL — AB (ref 0.8–2.0)

## 2016-01-25 MED ORDER — AMOXICILLIN-POT CLAVULANATE 875-125 MG PO TABS
1.0000 | ORAL_TABLET | Freq: Two times a day (BID) | ORAL | Status: DC
  Administered 2016-01-25: 1 via ORAL
  Filled 2016-01-25 (×3): qty 1

## 2016-01-25 MED ORDER — FREE WATER
200.0000 mL | Status: AC
  Administered 2016-01-25 – 2016-01-26 (×4): 200 mL

## 2016-01-25 MED ORDER — POTASSIUM CHLORIDE 20 MEQ/15ML (10%) PO SOLN
40.0000 meq | Freq: Once | ORAL | Status: AC
  Administered 2016-01-25: 40 meq
  Filled 2016-01-25: qty 30

## 2016-01-25 MED ORDER — METOPROLOL TARTRATE 50 MG PO TABS
75.0000 mg | ORAL_TABLET | Freq: Two times a day (BID) | ORAL | Status: DC
  Administered 2016-01-25: 22:00:00 75 mg
  Filled 2016-01-25 (×2): qty 1

## 2016-01-25 NOTE — Progress Notes (Signed)
PROGRESS NOTE    Jerry [name]  ZOX:[id number] DOB: [birth year] DOA: 01-15-16 PCP: Ezequiel Kayser, MD   Brief Narrative:  83 y.o. male who presents with acute onset of left facial droop with LUE and LLE weakness. Pt with R MCA stroke and Neurology on board.  Hospital stay has been complicated by dysphagia 2ary to stroke, aspiration, and atrial fibrillation. Palliative care following   Assessment & Plan:     Acute ischemic right MCA stroke [facility]) - Neurology on board and signed off on 1/6 - Pt with depressed mental status, repeat CT scan on 1/7 and reported: Changes consistent with evolving right middle cerebral artery infarct. No significant hemorrhagic component is noted. Previously seen petechial hemorrhages are again noted. -hdl 69, resume home meds statin and fish oil when able to take oral meds --on 1/10, he talked and started to move his left side, but per RN , there is no gag reflex when she tried to insert feeding tube,  -feeding tube dislodged several times, last placed on 1/13, currently on eliquis per neurology recommendation. -patient has a very large stroke, not sure how much he can recover from it ( per wife, patient was very active and goes to the gyn regularly prior to the stroke) Wife also agreed to talk to palliative care   Aspiration pneumonia: acute hypoxic respiratory failure , required bipap cxr on 1/7 "Opacity and effusion in the right base, likely pneumonia in the setting of fever. Followup PA and lateral chest X-ray is recommended in 3-4 weeks following trial of antibiotic therapy to ensure resolution and exclude underlying malignancy." - he required bipap , now on nasal cannula, seems improving on abx, repeat cxr on 1/11 "Diffuse right lung infiltrate with partial clearing from prior exam. Findings consistent with pneumonia" - keep head of bed elevated, continue aspiration precaution, d/c zosyn on 1/14, change to augmentin per tube for another three  days   chronic ATRIAL FIBRILLATION (h/o Allergic to Cardizem) - on xarelto /dig at home -He has been on rectal aspirin due to npo, transitioned to elliquis on 1/11 per neurology recommendation -he was on iv lopressor and amiodarone drip due to npo, now has feeding tube, transition meds to oral -continue dig, increase lopressor, continue adjust meds to achieve rate control      Essential hypertension - he was on permissive hypertension -Need to gradually normalized blood pressure, currently on B blocker  Hypernatremia: likely from dehydration,  now feeding tube placed in, free water through feeding tube, repeat bmp  Hypokalemia - replacing enterally. Check mag    Hypothyroidism - stable continue synthroid   Nutrition:   tube feeds, nutrition following Feeding tube clogged on 1/13, has to replace again on 1/13  DVT prophylaxis: was on Lovenox, started eliquis on 1/11 Code Status: DNR Family Communication: wife at bedside Disposition Plan: remain in stepdown unit.   Consultants:   Neurology  Critical care  Palliative care  Inpatient rehab   Procedures: None   Antimicrobials:unasyn then  Zosyn then augmentin from 1/14  Subjective: Patient was awake this am,  he follows commands, knows it is January 2018, know he is at [facility],  he had left neglect,but today, he is able to turn to the left side, he started to move his left arm and left leg when asked to He is on two liter oxygen, On tube feeds, rectal tube, condom catheter in place, has mitten on both hand and in wrist restrain bp elevated, heart rate better, still  in afib No fever, denies pain    Objective: Vitals:   01/25/16 0815 01/25/16 1000 01/25/16 1007 01/25/16 1235  BP: (!) 164/81   (!) 156/95  Pulse: (!) 57 (!) 104 87 74  Resp: (!) 21   (!) 24  Temp: 97.3 F (36.3 C)   97 F (36.1 C)  TempSrc: Axillary   Axillary  SpO2: 98%   97%  Weight:      Height:        Intake/Output Summary (Last 24  hours) at 01/25/16 1510 Last data filed at 01/25/16 1400  Gross per 24 hour  Intake             3270 ml  Output             1526 ml  Net             1744 ml   Filed Weights   01/23/16 0300 01/24/16 0300 01/25/16 0504  Weight: 83.6 kg (184 lb 4.9 oz) 84.2 kg (185 lb 10 oz) 85.7 kg (188 lb 15 oz)    Examination:  General exam: awake, following command, very frail, oriented Respiratory system: upper airway sounds, no wheezing Cardiovascular system: IRRR Gastrointestinal system: Abdomen is nondistended, soft and nontender. No organomegaly or masses felt. Normal bowel sounds heard. Central nervous system: awake, talking, orientedx3, following commands,  left facial droop, tongue protrude to the right but seems improving, he is able to move left arm and left leg now Extremities:  no clubbing, no edema Skin: No rashes, lesions or ulcers, on limited exam. Psychiatry: unable to assess due to depressed consciousness  Data Reviewed: I have personally reviewed following labs and imaging studies  CBC:  Recent Labs Lab 01/19/16 0149 01/22/16 0221 01/23/16 0353 01/24/16 0332 01/25/16 0334  WBC 19.1* 11.9* 11.7* 14.4* 15.7*  NEUTROABS 15.5* 9.2* 9.1*  --   --   HGB 14.7 13.8 13.5 14.3 13.6  HCT 43.7 42.6 40.9 42.6 41.5  MCV 100.7* 102.4* 100.2* 99.8 100.7*  PLT 295 262 231 251 271   Basic Metabolic Panel:  Recent Labs Lab 01/22/16 0221 01/22/16 1456 01/23/16 0353 01/24/16 0901 01/25/16 0334  NA 156* 153* 155* 154* 150*  K 3.2* 3.3* 2.8* 3.3* 3.3*  CL 121* 122* 119* 120* 118*  CO2 26 24 26 23 24   GLUCOSE 135* 172* 173* 198* 164*  BUN 36* 37* 38* 30* 28*  CREATININE 1.19 1.24 1.34* 1.26* 1.22  CALCIUM 8.4* 8.1* 8.2* 8.4* 8.3*  MG 2.6*  --  2.5* 2.3  --   PHOS 3.0  --  1.8* 3.0  --    GFR: Estimated Creatinine Clearance: 52.8 mL/min (by C-G formula based on SCr of 1.22 mg/dL). Liver Function Tests:  Recent Labs Lab 01/23/16 0353 01/24/16 0901  AST 70* 62*  ALT 123*  100*  ALKPHOS 58 70  BILITOT 2.2* 1.5*  PROT 5.5* 5.9*  ALBUMIN 2.2* 2.5*   No results for input(s): LIPASE, AMYLASE in the last 168 hours. No results for input(s): AMMONIA in the last 168 hours. Coagulation Profile: No results for input(s): INR, PROTIME in the last 168 hours. Cardiac Enzymes: No results for input(s): CKTOTAL, CKMB, CKMBINDEX, TROPONINI in the last 168 hours. BNP (last 3 results) No results for input(s): PROBNP in the last 8760 hours. HbA1C: No results for input(s): HGBA1C in the last 72 hours. CBG:  Recent Labs Lab 01/24/16 2019 01/25/16 0024 01/25/16 0507 01/25/16 0811 01/25/16 1233  GLUCAP 135* 155* 163* 155* 152*  Lipid Profile: No results for input(s): CHOL, HDL, LDLCALC, TRIG, CHOLHDL, LDLDIRECT in the last 72 hours. Thyroid Function Tests: No results for input(s): TSH, T4TOTAL, FREET4, T3FREE, THYROIDAB in the last 72 hours. Anemia Panel: No results for input(s): VITAMINB12, FOLATE, FERRITIN, TIBC, IRON, RETICCTPCT in the last 72 hours. Sepsis Labs: No results for input(s): PROCALCITON, LATICACIDVEN in the last 168 hours.  Recent Results (from the past 240 hour(s))  Culture, blood (routine x 2)     Status: None   Collection Time: 01/18/16  6:59 AM  Result Value Ref Range Status   Specimen Description BLOOD LEFT ANTECUBITAL  Final   Special Requests BOTTLES DRAWN AEROBIC ONLY  5CC  Final   Culture NO GROWTH 5 DAYS  Final   Report Status 01/24/2016 FINAL  Final  Culture, blood (routine x 2)     Status: None   Collection Time: 01/18/16  7:04 AM  Result Value Ref Range Status   Specimen Description BLOOD LEFT HAND  Final   Special Requests BOTTLES DRAWN AEROBIC AND ANAEROBIC  5CC  Final   Culture NO GROWTH 5 DAYS  Final   Report Status 01/24/2016 FINAL  Final  C difficile quick scan w PCR reflex     Status: None   Collection Time: 01/23/16  9:20 AM  Result Value Ref Range Status   C Diff antigen NEGATIVE NEGATIVE Final   C Diff toxin  NEGATIVE NEGATIVE Final   C Diff interpretation No C. difficile detected.  Final         Radiology Studies: Dg Abd Portable 1v  Result Date: 01/24/2016 CLINICAL DATA:  Enteric feeding tube placement. EXAM: PORTABLE ABDOMEN - 1 VIEW COMPARISON:  01/23/2016 FINDINGS: Enteric tube passes below the diaphragm. Tip projects in the distal stomach, possibly within the duodenal bulb. IMPRESSION: Enteric feeding tube tip projects the distal stomach or possibly the duodenal bulb. Electronically Signed   By: Amie Portlandavid  Ormond M.D.   On: 01/24/2016 14:06    Scheduled Meds: . allopurinol  300 mg Oral Daily  . amoxicillin-clavulanate  1 tablet Oral Q12H  . apixaban  5 mg Oral BID  . atorvastatin  40 mg Oral q1800  . chlorhexidine  15 mL Mouth Rinse BID  . digoxin  0.125 mg Oral Daily  . free water  200 mL Per Tube Q3H  . insulin aspart  0-9 Units Subcutaneous TID WC  . levothyroxine  100 mcg Oral QAC breakfast  . mouth rinse  15 mL Mouth Rinse q12n4p  . metoprolol tartrate  50 mg Per Tube BID  . potassium chloride  40 mEq Per Tube Once   Continuous Infusions: . feeding supplement (VITAL AF 1.2 CAL) 1,000 mL (01/25/16 0318)     LOS: 12 days    Time spent: > 35 minutes   Matheson Vandehei, MD PhD [facility] Pager [phone]  If 7PM-7AM, please contact night-coverage [email or web address] Password [email or web address] 01/25/2016, 3:10 PM

## 2016-01-26 LAB — HEPATIC FUNCTION PANEL
ALT: 79 U/L — AB (ref 17–63)
AST: 58 U/L — ABNORMAL HIGH (ref 15–41)
Albumin: 2.5 g/dL — ABNORMAL LOW (ref 3.5–5.0)
Alkaline Phosphatase: 80 U/L (ref 38–126)
BILIRUBIN DIRECT: 0.4 mg/dL (ref 0.1–0.5)
BILIRUBIN INDIRECT: 0.7 mg/dL (ref 0.3–0.9)
TOTAL PROTEIN: 5.7 g/dL — AB (ref 6.5–8.1)
Total Bilirubin: 1.1 mg/dL (ref 0.3–1.2)

## 2016-01-26 LAB — CBC
HEMATOCRIT: 41.6 % (ref 39.0–52.0)
Hemoglobin: 13.7 g/dL (ref 13.0–17.0)
MCH: 33.1 pg (ref 26.0–34.0)
MCHC: 32.9 g/dL (ref 30.0–36.0)
MCV: 100.5 fL — ABNORMAL HIGH (ref 78.0–100.0)
Platelets: 288 10*3/uL (ref 150–400)
RBC: 4.14 MIL/uL — AB (ref 4.22–5.81)
RDW: 14.6 % (ref 11.5–15.5)
WBC: 15 10*3/uL — AB (ref 4.0–10.5)

## 2016-01-26 LAB — URINE CULTURE: Culture: NO GROWTH

## 2016-01-26 LAB — GLUCOSE, CAPILLARY
GLUCOSE-CAPILLARY: 108 mg/dL — AB (ref 65–99)
GLUCOSE-CAPILLARY: 127 mg/dL — AB (ref 65–99)
GLUCOSE-CAPILLARY: 135 mg/dL — AB (ref 65–99)
GLUCOSE-CAPILLARY: 136 mg/dL — AB (ref 65–99)
GLUCOSE-CAPILLARY: 153 mg/dL — AB (ref 65–99)
GLUCOSE-CAPILLARY: 164 mg/dL — AB (ref 65–99)
Glucose-Capillary: 150 mg/dL — ABNORMAL HIGH (ref 65–99)
Glucose-Capillary: 144 mg/dL — ABNORMAL HIGH (ref 65–99)
Glucose-Capillary: 134 mg/dL — ABNORMAL HIGH (ref 65–99)
Glucose-Capillary: 137 mg/dL — ABNORMAL HIGH (ref 65–99)
Glucose-Capillary: 148 mg/dL — ABNORMAL HIGH (ref 65–99)
Glucose-Capillary: 186 mg/dL — ABNORMAL HIGH (ref 65–99)
Glucose-Capillary: 191 mg/dL — ABNORMAL HIGH (ref 65–99)

## 2016-01-26 LAB — BASIC METABOLIC PANEL
ANION GAP: 10 (ref 5–15)
BUN: 26 mg/dL — AB (ref 6–20)
CO2: 25 mmol/L (ref 22–32)
Calcium: 8.4 mg/dL — ABNORMAL LOW (ref 8.9–10.3)
Chloride: 112 mmol/L — ABNORMAL HIGH (ref 101–111)
Creatinine, Ser: 1.11 mg/dL (ref 0.61–1.24)
GFR calc Af Amer: >60 mL/min (ref >60)
GFR calc non Af Amer: 60 mL/min — ABNORMAL LOW (ref >60)
Glucose, Bld: 130 mg/dL — ABNORMAL HIGH (ref 65–99)
POTASSIUM: 3.3 mmol/L — AB (ref 3.5–5.1)
Sodium: 147 mmol/L — ABNORMAL HIGH (ref 135–145)

## 2016-01-26 LAB — PHOSPHORUS: PHOSPHORUS: 3.9 mg/dL (ref 2.5–4.6)

## 2016-01-26 LAB — MAGNESIUM: MAGNESIUM: 2.3 mg/dL (ref 1.7–2.4)

## 2016-01-26 MED ORDER — SODIUM CHLORIDE 0.9 % IV SOLN
3.0000 g | Freq: Three times a day (TID) | INTRAVENOUS | Status: DC
  Administered 2016-01-26 – 2016-01-28 (×6): 3 g via INTRAVENOUS
  Filled 2016-01-26 (×7): qty 3

## 2016-01-26 MED ORDER — METOPROLOL TARTRATE 5 MG/5ML IV SOLN
5.0000 mg | Freq: Four times a day (QID) | INTRAVENOUS | Status: DC
  Administered 2016-01-26 – 2016-01-27 (×5): 5 mg via INTRAVENOUS
  Filled 2016-01-26 (×5): qty 5

## 2016-01-26 MED ORDER — ENOXAPARIN SODIUM 40 MG/0.4ML ~~LOC~~ SOLN
40.0000 mg | SUBCUTANEOUS | Status: DC
  Administered 2016-01-26 – 2016-01-27 (×2): 40 mg via SUBCUTANEOUS
  Filled 2016-01-26 (×2): qty 0.4

## 2016-01-26 MED ORDER — ASPIRIN 300 MG RE SUPP
300.0000 mg | Freq: Every day | RECTAL | Status: DC
  Administered 2016-01-26 – 2016-01-27 (×2): 300 mg via RECTAL
  Filled 2016-01-26 (×2): qty 1

## 2016-01-26 NOTE — Progress Notes (Signed)
Pt's cortrak tube became clogged.Tube feeds stopped.  RN Attempted to flush with coke however pt began coughing and tube was coiled in the back of pt's throat. Removed Cortrak. Notified MD Hugelmeyer, MD aware. Will talk with day team to decide about replacing cortrak

## 2016-01-26 NOTE — Progress Notes (Signed)
Daily Progress Note   Patient Name: Jerry [name]       Date: 01/26/2016 DOB: 12/17/1931  Age: 83 y.o. MRN#: [id number] Attending Physician: Albertine GratesFang Xu, MD Primary Care Physician: Ezequiel KayserPERINI,MARK A, MD Admit Date: 02/06/2016  Reason for Consultation/Follow-up: Establishing goals of care  83 y.o.malewho presents with acute onset of left facial droop with LUE and LLE weakness. Pt with R MCA stroke and Neurology on board.  Hospital stay has been complicated by dysphagia 2ary to stroke, aspiration, and atrial fibrillation. Overnight, cortrak tube has been removed. Patient has removed his cortrak tube in the past.    Subjective:  rapid shallow neuro type breathing, patient is not awake, not alert He does not open his eyes, does not follow commands He does not move his L side  Length of Stay: 13  Current Medications: Scheduled Meds:  . allopurinol  300 mg Oral Daily  . amoxicillin-clavulanate  1 tablet Oral Q12H  . apixaban  5 mg Oral BID  . atorvastatin  40 mg Oral q1800  . chlorhexidine  15 mL Mouth Rinse BID  . digoxin  0.125 mg Oral Daily  . insulin aspart  0-9 Units Subcutaneous TID WC  . levothyroxine  100 mcg Oral QAC breakfast  . mouth rinse  15 mL Mouth Rinse q12n4p  . metoprolol tartrate  75 mg Per Tube BID    Continuous Infusions: . feeding supplement (VITAL AF 1.2 CAL) Stopped (01/26/16 0535)    PRN Meds: acetaminophen **OR** acetaminophen (TYLENOL) oral liquid 160 mg/5 mL **OR** acetaminophen, hydrALAZINE, levalbuterol, LORazepam, morphine injection  Physical Exam         Not awake not alert Does not follow commands Rapid shallow breathing S1 S2 No edema  Vital Signs: BP (!) 152/94 (BP Location: Left Arm)   Pulse (!) 105   Temp 97.2 F (36.2 C) (Axillary)    Resp (!) 26   Ht 6\' 1"  (1.854 m)   Wt 84.5 kg (186 lb 4.6 oz)   SpO2 96%   BMI 24.58 kg/m  SpO2: SpO2: 96 % O2 Device: O2 Device: Nasal Cannula O2 Flow Rate: O2 Flow Rate (L/min): 2 L/min  Intake/output summary:  Intake/Output Summary (Last 24 hours) at 01/26/16 1123 Last data filed at 01/26/16 0535  Gross per 24 hour  Intake  1580 ml  Output             2202 ml  Net             -622 ml   LBM: Last BM Date: 01/25/16 Baseline Weight: Weight: 87.2 kg (192 lb 5 oz) Most recent weight: Weight: 84.5 kg (186 lb 4.6 oz)       Palliative Assessment/Data:    Flowsheet Rows   Flowsheet Row Most Recent Value  Intake Tab  Referral Department  Hospitalist  Unit at Time of Referral  Cardiac/Telemetry Unit  Palliative Care Primary Diagnosis  Neurology  Date Notified  01/21/16  Palliative Care Type  Return patient Palliative Care  Reason for referral  Clarify Goals of Care  Date of Admission  February 09, 2016  # of days IP prior to Palliative referral  8  Clinical Assessment  Palliative Performance Scale Score  10%  Pain Max last 24 hours  4  Pain Min Last 24 hours  3  Dyspnea Max Last 24 Hours  4  Dyspnea Min Last 24 hours  3  Nausea Max Last 24 Hours  4  Nausea Min Last 24 Hours  3  Anxiety Max Last 24 Hours  4  Anxiety Min Last 24 Hours  3  Psychosocial & Spiritual Assessment  Palliative Care Outcomes  Patient/Family meeting held?  Yes  Who was at the meeting?  wife, church friend who is also an Charity fundraiser  Palliative Care Outcomes  Clarified goals of care      Patient Active Problem List   Diagnosis Date Noted  . Goals of care, counseling/discussion   . Encounter for hospice care discussion   . Palliative care encounter   . Aspiration pneumonia of both lower lobes due to gastric secretions (HCC)   . Embolic infarction (HCC)   . Tachypnea   . Nonverbal   . Somnolence   . Encounter for feeding tube placement   . Respiratory distress   . Acute ischemic right MCA stroke  (HCC) 02/09/2016  . CVA (cerebral vascular accident) (HCC) February 09, 2016  . Hyperlipidemia   . Dyslipidemia   . Essential hypertension   . Hypothyroidism   . ATRIAL FIBRILLATION 12/12/2009  . DIARRHEA 12/12/2009  . PERSONAL HX COLONIC POLYPS 12/12/2009    Palliative Care Assessment & Plan   Patient Profile:    Assessment:  83 y.o.malewho presents with acute onset of left facial droop with LUE and LLE weakness. Pt with R MCA stroke and Neurology on board.  Hospital stay has been complicated by dysphagia 2ary to stroke, aspiration, and atrial fibrillation.  Recommendations/Plan:   Family meeting with wife and a church friend who is also an Charity fundraiser:  Extensive discussions with wife regarding goals of care following an extensive stroke, ongoing issues with aspiration, patient not able to tolerate cortrak tube, he has pulled it out before. Patient with stroke in evolution, last brain imaging with extensive R MCA stroke, also with asp pna, has A fib. Patient was very active prior to the stroke, he has been married to his wife for more than 50 years. He has a son who lives 4-5 hours away. Wife has a good support system in her church friends and neighbors. She is still overwhelmed with the patient's sudden illness and ongoing decline.   PLAN: No artificial nutrition, no PEG, no re insertion of cortrak. Patient will need to have his essential medications changed to the IV route Time trial of current therapies for another 24 to 48  hours Wife to consider residential hospice if no significant improvement after the next day or two. Comfort feedings if the patient is awake alert No more tube feeds Palliative will continue to follow along and help guide decision making.     Code Status:    Code Status Orders        Start     Ordered   01/15/16 0249  Do not attempt resuscitation (DNR)  Continuous    Question Answer Comment  In the event of cardiac or respiratory ARREST Do not call a "code  blue"   In the event of cardiac or respiratory ARREST Do not perform Intubation, CPR, defibrillation or ACLS   In the event of cardiac or respiratory ARREST Use medication by any route, position, wound care, and other measures to relive pain and suffering. May use oxygen, suction and manual treatment of airway obstruction as needed for comfort.      01/15/16 0248    Code Status History    Date Active Date Inactive Code Status Order ID Comments User Context   01/18/16  7:05 AM 01/15/2016  2:48 AM Full Code [id number]  Eduard Clos, MD Inpatient    Advance Directive Documentation   Flowsheet Row Most Recent Value  Type of Advance Directive  Living will, Healthcare Power of Attorney  Pre-existing out of facility DNR order (yellow form or pink MOST form)  No data  "MOST" Form in Place?  No data       Prognosis:   guarded   Discharge Planning:  To Be Determined  Care plan was discussed with  Patient wife and wife friend.   Thank you for allowing the Palliative Medicine Team to assist in the care of this patient.   Time In: 10 Time Out: 11 Total Time 60 Prolonged Time Billed  no       Greater than 50%  of this time was spent counseling and coordinating care related to the above assessment and plan.  Rosalin Hawking, MD [phone]  Please contact Palliative Medicine Team phone at [phone] for questions and concerns.

## 2016-01-26 NOTE — Progress Notes (Signed)
PROGRESS NOTE    Jerry [name]  JYN:[id number] DOB: Oct 29, 1931 DOA: 01/14/2016 PCP: Ezequiel KayserPERINI,MARK A, MD   Brief Narrative:  83 y.o. male who presents with acute onset of left facial droop with LUE and LLE weakness. Pt with R MCA stroke and Neurology on board.  Hospital stay has been complicated by dysphagia 2ary to stroke, aspiration, and atrial fibrillation. Palliative care following   Assessment & Plan:     Acute ischemic right MCA stroke [facility](HCC) - Neurology consulted and signed off on 1/6 - -patient has showed signs of improvement prior to 1/15, however, he likely suffered from another aspiration event, the night of 1/14-1/15, condition worsening, change all meds to iv, wife has decided donot replace  Temporary feeding tube , palliative care following, likely residential hospice in the next few days   Aspiration pneumonia: acute hypoxic respiratory failure , required bipap - -he showed some improvement piror to 1/15 , however, he likely suffered from another aspiration event the night on 1/14-1/15, condition worsening, change all meds to iv, wife has decided donot replace  Temporary feeding tube , palliative care following, likely residential hospice in the next few days    chronic ATRIAL FIBRILLATION (h/o Allergic to Cardizem) - iv lopressor      Essential hypertension - he was on permissive hypertension -iv lopressor  Hypernatremia:  Corrected with free water through feeding tube, now feeding tube out, wife has decided donot replace  Temporary feeding tube , palliative care following, likely residential hospice in the next few days   Hypokalemia - iv supplement for now, likely transition to full comfort soon    Hypothyroidism - not able to take oral meds   Nutrition:   temporary feeding tube dislodges several times, wife has decided donot replace  Temporary feeding tube , palliative care following, likely residential hospice in the next few days   DVT prophylaxis:  lovenox currently Code Status: DNR Family Communication: patient Disposition Plan: remain in stepdown unit.   Consultants:   Neurology  Critical care  Inpatient rehab  Palliative care   Procedures: None   Antimicrobials:unasyn then  Zosyn then augmentin then unasyn  Subjective:     he likely suffered from another aspiration event, the night of 1/14-1/15, condition worsening, Feeding tube out, wife has decided donot replace  Temporary feeding tube ,  palliative care following, likely residential hospice in the next few days   Objective: Vitals:   01/26/16 0000 01/26/16 0028 01/26/16 0349 01/26/16 0353  BP: (!) 167/99 (!) 167/99 (!) 185/92   Pulse: 62 88 62   Resp: (!) 24 (!) 25 (!) 36   Temp: 97.3 F (36.3 C)  97.7 F (36.5 C)   TempSrc: Axillary  Axillary   SpO2: 96% 96% 97%   Weight:  85.6 kg (188 lb 11.4 oz)  84.5 kg (186 lb 4.6 oz)  Height:        Intake/Output Summary (Last 24 hours) at 01/26/16 0807 Last data filed at 01/26/16 0535  Gross per 24 hour  Intake             1790 ml  Output             2202 ml  Net             -412 ml   Filed Weights   01/25/16 0504 01/26/16 0028 01/26/16 0353  Weight: 85.7 kg (188 lb 15 oz) 85.6 kg (188 lb 11.4 oz) 84.5 kg (186 lb 4.6 oz)  Examination:  General exam: look worse today Respiratory system: upper airway sounds, no wheezing Cardiovascular system: IRRR Gastrointestinal system: Abdomen is nondistended, soft and nontender. No organomegaly or masses felt. Normal bowel sounds heard. Central nervous system: worse today, very lethargic Extremities:  no clubbing, no edema Skin: No rashes, lesions or ulcers, on limited exam. Psychiatry: unable to assess due to depressed consciousness  Data Reviewed: I have personally reviewed following labs and imaging studies  CBC:  Recent Labs Lab 01/22/16 0221 01/23/16 0353 01/24/16 0332 01/25/16 0334 01/26/16 0440  WBC 11.9* 11.7* 14.4* 15.7* 15.0*  NEUTROABS  9.2* 9.1*  --   --   --   HGB 13.8 13.5 14.3 13.6 13.7  HCT 42.6 40.9 42.6 41.5 41.6  MCV 102.4* 100.2* 99.8 100.7* 100.5*  PLT 262 231 251 271 288   Basic Metabolic Panel:  Recent Labs Lab 01/22/16 0221 01/22/16 1456 01/23/16 0353 01/24/16 0901 01/25/16 0334 01/26/16 0440  NA 156* 153* 155* 154* 150* 147*  K 3.2* 3.3* 2.8* 3.3* 3.3* 3.3*  CL 121* 122* 119* 120* 118* 112*  CO2 26 24 26 23 24 25   GLUCOSE 135* 172* 173* 198* 164* 130*  BUN 36* 37* 38* 30* 28* 26*  CREATININE 1.19 1.24 1.34* 1.26* 1.22 1.11  CALCIUM 8.4* 8.1* 8.2* 8.4* 8.3* 8.4*  MG 2.6*  --  2.5* 2.3  --  2.3  PHOS 3.0  --  1.8* 3.0  --  3.9   GFR: Estimated Creatinine Clearance: 58 mL/min (by C-G formula based on SCr of 1.11 mg/dL). Liver Function Tests:  Recent Labs Lab 01/23/16 0353 01/24/16 0901 01/26/16 0440  AST 70* 62* 58*  ALT 123* 100* 79*  ALKPHOS 58 70 80  BILITOT 2.2* 1.5* 1.1  PROT 5.5* 5.9* 5.7*  ALBUMIN 2.2* 2.5* 2.5*   No results for input(s): LIPASE, AMYLASE in the last 168 hours. No results for input(s): AMMONIA in the last 168 hours. Coagulation Profile: No results for input(s): INR, PROTIME in the last 168 hours. Cardiac Enzymes: No results for input(s): CKTOTAL, CKMB, CKMBINDEX, TROPONINI in the last 168 hours. BNP (last 3 results) No results for input(s): PROBNP in the last 8760 hours. HbA1C: No results for input(s): HGBA1C in the last 72 hours. CBG:  Recent Labs Lab 01/25/16 0811 01/25/16 1233 01/25/16 2011 01/26/16 0025 01/26/16 0359  GLUCAP 155* 152* 128* 164* 150*   Lipid Profile: No results for input(s): CHOL, HDL, LDLCALC, TRIG, CHOLHDL, LDLDIRECT in the last 72 hours. Thyroid Function Tests: No results for input(s): TSH, T4TOTAL, FREET4, T3FREE, THYROIDAB in the last 72 hours. Anemia Panel: No results for input(s): VITAMINB12, FOLATE, FERRITIN, TIBC, IRON, RETICCTPCT in the last 72 hours. Sepsis Labs: No results for input(s): PROCALCITON, LATICACIDVEN  in the last 168 hours.  Recent Results (from the past 240 hour(s))  Culture, blood (routine x 2)     Status: None   Collection Time: 01/18/16  6:59 AM  Result Value Ref Range Status   Specimen Description BLOOD LEFT ANTECUBITAL  Final   Special Requests BOTTLES DRAWN AEROBIC ONLY  5CC  Final   Culture NO GROWTH 5 DAYS  Final   Report Status 01/24/2016 FINAL  Final  Culture, blood (routine x 2)     Status: None   Collection Time: 01/18/16  7:04 AM  Result Value Ref Range Status   Specimen Description BLOOD LEFT HAND  Final   Special Requests BOTTLES DRAWN AEROBIC AND ANAEROBIC  5CC  Final   Culture NO GROWTH  5 DAYS  Final   Report Status 01/24/2016 FINAL  Final  C difficile quick scan w PCR reflex     Status: None   Collection Time: 01/23/16  9:20 AM  Result Value Ref Range Status   C Diff antigen NEGATIVE NEGATIVE Final   C Diff toxin NEGATIVE NEGATIVE Final   C Diff interpretation No C. difficile detected.  Final         Radiology Studies: Dg Abd Portable 1v  Result Date: 01/24/2016 CLINICAL DATA:  Enteric feeding tube placement. EXAM: PORTABLE ABDOMEN - 1 VIEW COMPARISON:  01/23/2016 FINDINGS: Enteric tube passes below the diaphragm. Tip projects in the distal stomach, possibly within the duodenal bulb. IMPRESSION: Enteric feeding tube tip projects the distal stomach or possibly the duodenal bulb. Electronically Signed   By: Amie Portland M.D.   On: 01/24/2016 14:06    Scheduled Meds: . allopurinol  300 mg Oral Daily  . amoxicillin-clavulanate  1 tablet Oral Q12H  . apixaban  5 mg Oral BID  . atorvastatin  40 mg Oral q1800  . chlorhexidine  15 mL Mouth Rinse BID  . digoxin  0.125 mg Oral Daily  . insulin aspart  0-9 Units Subcutaneous TID WC  . levothyroxine  100 mcg Oral QAC breakfast  . mouth rinse  15 mL Mouth Rinse q12n4p  . metoprolol tartrate  75 mg Per Tube BID   Continuous Infusions: . feeding supplement (VITAL AF 1.2 CAL) Stopped (01/26/16 0535)     LOS:  13 days    Time spent: > 35 minutes   Kaitlyne Friedhoff, MD PhD [facility] Pager [phone]  If 7PM-7AM, please contact night-coverage [email or web address] Password [email or web address] 01/26/2016, 8:07 AM

## 2016-01-26 NOTE — Progress Notes (Signed)
SLP Cancellation Note  Patient Details Name: Lorna FewHarold D Staniszewski MRN: [id number] DOB: [birth year]   Cancelled treatment:       Reason Eval/Treat Not Completed: Patient's level of consciousness. Spoke with wife; requests f/u should alertness improve.  Rondel BatonMary Beth Ronit Marczak, [place] CF-SLP Speech-Language Pathologist [phone]   Arlana LindauMary E Mathew Postiglione 01/26/2016, 2:09 PM

## 2016-01-26 NOTE — Progress Notes (Addendum)
Pharmacy Antibiotic Note  Jerry [name] is a 83 y.o. male admitted on 02/04/2016 with aspiration PNA.  Pharmacy has been consulted for Unasyn dosing.  Had been on Augmentin finishing up course and now NPO so transitioning back to Unasyn for now. Afebrile, WBC 15. SCr down to 1.11, CrCl ~3660ml/min  Plan: Start Unasyn 3g IV Q8 Monitor clinical picture, renal function Stop date entered for 1/17   Height: 6\' 1"  (185.4 cm) Weight: 186 lb 4.6 oz (84.5 kg) IBW/kg (Calculated) : 79.9  Temp (24hrs), Avg:97.3 F (36.3 C), Min:96.6 F (35.9 C), Max:97.8 F (36.6 C)   Recent Labs Lab 01/21/16 0855  01/22/16 0221 01/22/16 1456 01/23/16 0353 01/24/16 0332 01/24/16 0901 01/25/16 0334 01/26/16 0440  WBC  --   --  11.9*  --  11.7* 14.4*  --  15.7* 15.0*  CREATININE  --   < > 1.19 1.24 1.34*  --  1.26* 1.22 1.11  VANCOTROUGH 16  --   --   --   --   --   --   --   --   < > = values in this interval not displayed.  Estimated Creatinine Clearance: 58 mL/min (by C-G formula based on SCr of 1.11 mg/dL).    Allergies  Allergen Reactions  . Cardizem [Diltiazem Hcl] Rash    Antimicrobials this admission:  Vancomycin 1/7 >> 1/10 Zosyn 1/7 >> 1/14 Augmentin 1/14 >> (1/17) Unasyn 1/4 >> 1/7; 1/15 >>  Microbiology results:  1/12 Cdiff: neg  1/7 BCx: ngtd 1/4 BCx: ngF 1/2 MRSA PCR: neg  Thank you for allowing pharmacy to be a part of this patient's care.  Armandina StammerBATCHELDER,Cyntha Brickman J 01/26/2016 12:29 PM

## 2016-01-27 DIAGNOSIS — J690 Pneumonitis due to inhalation of food and vomit: Secondary | ICD-10-CM

## 2016-01-27 LAB — GLUCOSE, CAPILLARY
GLUCOSE-CAPILLARY: 126 mg/dL — AB (ref 65–99)
GLUCOSE-CAPILLARY: 126 mg/dL — AB (ref 65–99)
GLUCOSE-CAPILLARY: 148 mg/dL — AB (ref 65–99)
GLUCOSE-CAPILLARY: 162 mg/dL — AB (ref 65–99)
Glucose-Capillary: 116 mg/dL — ABNORMAL HIGH (ref 65–99)
Glucose-Capillary: 119 mg/dL — ABNORMAL HIGH (ref 65–99)
Glucose-Capillary: 147 mg/dL — ABNORMAL HIGH (ref 65–99)

## 2016-01-27 LAB — CBC
HEMATOCRIT: 43.5 % (ref 39.0–52.0)
HEMOGLOBIN: 14.7 g/dL (ref 13.0–17.0)
MCH: 33.3 pg (ref 26.0–34.0)
MCHC: 33.8 g/dL (ref 30.0–36.0)
MCV: 98.6 fL (ref 78.0–100.0)
Platelets: 309 10*3/uL (ref 150–400)
RBC: 4.41 MIL/uL (ref 4.22–5.81)
RDW: 14.5 % (ref 11.5–15.5)
WBC: 16.7 10*3/uL — ABNORMAL HIGH (ref 4.0–10.5)

## 2016-01-27 LAB — BASIC METABOLIC PANEL
ANION GAP: 12 (ref 5–15)
BUN: 22 mg/dL — ABNORMAL HIGH (ref 6–20)
CHLORIDE: 108 mmol/L (ref 101–111)
CO2: 25 mmol/L (ref 22–32)
Calcium: 8.4 mg/dL — ABNORMAL LOW (ref 8.9–10.3)
Creatinine, Ser: 1.06 mg/dL (ref 0.61–1.24)
GFR calc non Af Amer: >60 mL/min (ref >60)
Glucose, Bld: 128 mg/dL — ABNORMAL HIGH (ref 65–99)
Potassium: 3.1 mmol/L — ABNORMAL LOW (ref 3.5–5.1)
SODIUM: 145 mmol/L (ref 135–145)

## 2016-01-27 MED ORDER — SODIUM CHLORIDE 0.9 % IV SOLN
30.0000 meq | INTRAVENOUS | Status: AC
Start: 2016-01-27 — End: 2016-01-28
  Administered 2016-01-27 (×2): 30 meq via INTRAVENOUS
  Filled 2016-01-27 (×2): qty 15

## 2016-01-27 MED ORDER — KCL-LACTATED RINGERS-D5W 20 MEQ/L IV SOLN
INTRAVENOUS | Status: DC
  Administered 2016-01-27: 1 mL via INTRAVENOUS
  Filled 2016-01-27 (×2): qty 1000

## 2016-01-27 MED ORDER — METOPROLOL TARTRATE 5 MG/5ML IV SOLN
10.0000 mg | Freq: Four times a day (QID) | INTRAVENOUS | Status: DC
  Administered 2016-01-27 – 2016-01-28 (×3): 10 mg via INTRAVENOUS
  Filled 2016-01-27 (×3): qty 10

## 2016-01-27 NOTE — Progress Notes (Signed)
PROGRESS NOTE    Jerry [name]  UJW:[id number] DOB: [birth year] DOA: 01/22/2016 PCP: Ezequiel Kayser, MD   Brief Narrative:  83 y.o. male who presents with acute onset of left facial droop with LUE and LLE weakness. Pt with R MCA stroke and Neurology on board.  Hospital stay has been complicated by dysphagia 2ary to stroke, aspiration, and atrial fibrillation.  Palliative care following   Assessment & Plan:     Acute ischemic right MCA stroke [facility]) - Neurology consulted and signed off on 1/6 - -patient has showed signs of improvement prior to 1/15, however, he likely suffered from another aspiration event on the night of 1/14-1/15, condition worsening, change all meds to iv, wife has decided donot replace  temporary feeding tube (has fell out several times),  -palliative care following, likely residential hospice in the next few days   Aspiration pneumonia: acute hypoxic respiratory failure , required bipap - -he showed some improvement piror to 1/15 , however, he likely suffered from another aspiration event the night on 1/14-1/15, condition worsening, change all meds to iv, wife has decided donot replace  Temporary feeding tube , palliative care following, likely residential hospice in the next few days    chronic ATRIAL FIBRILLATION (h/o Allergic to Cardizem) - iv lopressor      Essential hypertension - he was on permissive hypertension -iv lopressor  Hypernatremia:  Corrected with free water through feeding tube, now feeding tube out, wife has decided donot replace  Temporary feeding tube , palliative care following, likely residential hospice in the next few days   Hypokalemia - iv supplement for now, likely transition to full comfort soon    Hypothyroidism - not able to take oral meds   Nutrition:   temporary feeding tube dislodges several times, wife has decided donot replace  Temporary feeding tube , palliative care following, likely residential hospice in the next  few days   DVT prophylaxis: lovenox currently Code Status: DNR Family Communication: patient Disposition Plan: remain in stepdown unit.   Consultants:   Neurology  Critical care  Inpatient rehab  Palliative care   Procedures: None   Antimicrobials:unasyn then  Zosyn then augmentin then unasyn  Subjective:     he likely suffered from another aspiration event, the night of 1/14-1/15, condition worsening, Feeding tube out, wife has decided donot replace feeding tube ,  palliative care following, likely residential hospice in the next few days   Objective: Vitals:   01/27/16 0700 01/27/16 1200 01/27/16 1500 01/27/16 1600  BP: (!) 136/96 (!) 138/113 (!) 162/120   Pulse: 97 79 (!) 49   Resp: (!) 26 (!) 24 14   Temp: (!) 96.7 F (35.9 C) 97.8 F (36.6 C) 97.7 F (36.5 C)   TempSrc: Oral Oral Axillary Axillary  SpO2: 92% 94% 96%   Weight:      Height:        Intake/Output Summary (Last 24 hours) at 01/27/16 1734 Last data filed at 01/27/16 1100  Gross per 24 hour  Intake              260 ml  Output             1375 ml  Net            -1115 ml   Filed Weights   01/26/16 0028 01/26/16 0353 01/27/16 0404  Weight: 85.6 kg (188 lb 11.4 oz) 84.5 kg (186 lb 4.6 oz) 82.3 kg (181 lb 7 oz)  Examination:  General exam:  Lethargic, no significant change overnight, he try to follow commands, he has dysarthria, but able to tell me it is 2018, know he is at [facility] this am Respiratory system: upper airway sounds, no wheezing Cardiovascular system: IRRR Gastrointestinal system: Abdomen is nondistended, soft and nontender. No organomegaly or masses felt. Normal bowel sounds heard. Central nervous system: lethargic, left hemiparesis Extremities:  no clubbing, no edema Skin: No rashes, lesions or ulcers, on limited exam. Psychiatry: unable to assess due to depressed consciousness  Data Reviewed: I have personally reviewed following labs and imaging  studies  CBC:  Recent Labs Lab 01/22/16 0221 01/23/16 0353 01/24/16 0332 01/25/16 0334 01/26/16 0440 01/27/16 0201  WBC 11.9* 11.7* 14.4* 15.7* 15.0* 16.7*  NEUTROABS 9.2* 9.1*  --   --   --   --   HGB 13.8 13.5 14.3 13.6 13.7 14.7  HCT 42.6 40.9 42.6 41.5 41.6 43.5  MCV 102.4* 100.2* 99.8 100.7* 100.5* 98.6  PLT 262 231 251 271 288 309   Basic Metabolic Panel:  Recent Labs Lab 01/22/16 0221  01/23/16 0353 01/24/16 0901 01/25/16 0334 01/26/16 0440 01/27/16 0201  NA 156*  < > 155* 154* 150* 147* 145  K 3.2*  < > 2.8* 3.3* 3.3* 3.3* 3.1*  CL 121*  < > 119* 120* 118* 112* 108  CO2 26  < > 26 23 24 25 25   GLUCOSE 135*  < > 173* 198* 164* 130* 128*  BUN 36*  < > 38* 30* 28* 26* 22*  CREATININE 1.19  < > 1.34* 1.26* 1.22 1.11 1.06  CALCIUM 8.4*  < > 8.2* 8.4* 8.3* 8.4* 8.4*  MG 2.6*  --  2.5* 2.3  --  2.3  --   PHOS 3.0  --  1.8* 3.0  --  3.9  --   < > = values in this interval not displayed. GFR: Estimated Creatinine Clearance: 60.7 mL/min (by C-G formula based on SCr of 1.06 mg/dL). Liver Function Tests:  Recent Labs Lab 01/23/16 0353 01/24/16 0901 01/26/16 0440  AST 70* 62* 58*  ALT 123* 100* 79*  ALKPHOS 58 70 80  BILITOT 2.2* 1.5* 1.1  PROT 5.5* 5.9* 5.7*  ALBUMIN 2.2* 2.5* 2.5*   No results for input(s): LIPASE, AMYLASE in the last 168 hours. No results for input(s): AMMONIA in the last 168 hours. Coagulation Profile: No results for input(s): INR, PROTIME in the last 168 hours. Cardiac Enzymes: No results for input(s): CKTOTAL, CKMB, CKMBINDEX, TROPONINI in the last 168 hours. BNP (last 3 results) No results for input(s): PROBNP in the last 8760 hours. HbA1C: No results for input(s): HGBA1C in the last 72 hours. CBG:  Recent Labs Lab 01/27/16 0011 01/27/16 0443 01/27/16 0745 01/27/16 1201 01/27/16 1603  GLUCAP 116* 126* 126* 148* 119*   Lipid Profile: No results for input(s): CHOL, HDL, LDLCALC, TRIG, CHOLHDL, LDLDIRECT in the last 72  hours. Thyroid Function Tests: No results for input(s): TSH, T4TOTAL, FREET4, T3FREE, THYROIDAB in the last 72 hours. Anemia Panel: No results for input(s): VITAMINB12, FOLATE, FERRITIN, TIBC, IRON, RETICCTPCT in the last 72 hours. Sepsis Labs: No results for input(s): PROCALCITON, LATICACIDVEN in the last 168 hours.  Recent Results (from the past 240 hour(s))  Culture, blood (routine x 2)     Status: None   Collection Time: 01/18/16  6:59 AM  Result Value Ref Range Status   Specimen Description BLOOD LEFT ANTECUBITAL  Final   Special Requests BOTTLES DRAWN AEROBIC  ONLY  5CC  Final   Culture NO GROWTH 5 DAYS  Final   Report Status 01/24/2016 FINAL  Final  Culture, blood (routine x 2)     Status: None   Collection Time: 01/18/16  7:04 AM  Result Value Ref Range Status   Specimen Description BLOOD LEFT HAND  Final   Special Requests BOTTLES DRAWN AEROBIC AND ANAEROBIC  5CC  Final   Culture NO GROWTH 5 DAYS  Final   Report Status 01/24/2016 FINAL  Final  C difficile quick scan w PCR reflex     Status: None   Collection Time: 01/23/16  9:20 AM  Result Value Ref Range Status   C Diff antigen NEGATIVE NEGATIVE Final   C Diff toxin NEGATIVE NEGATIVE Final   C Diff interpretation No C. difficile detected.  Final  Urine culture     Status: None   Collection Time: 01/25/16  5:36 PM  Result Value Ref Range Status   Specimen Description URINE, CLEAN CATCH  Final   Special Requests NONE  Final   Culture NO GROWTH  Final   Report Status 01/26/2016 FINAL  Final         Radiology Studies: No results found.  Scheduled Meds: . ampicillin-sulbactam (UNASYN) IV  3 g Intravenous Q8H  . aspirin  300 mg Rectal Daily  . chlorhexidine  15 mL Mouth Rinse BID  . enoxaparin (LOVENOX) injection  40 mg Subcutaneous Q24H  . insulin aspart  0-9 Units Subcutaneous TID WC  . mouth rinse  15 mL Mouth Rinse q12n4p  . metoprolol  10 mg Intravenous Q6H   Continuous Infusions:    LOS: 14 days     Time spent: 25 minutes   Trapper Meech, MD PhD [facility] Pager [phone]  If 7PM-7AM, please contact night-coverage [email or web address] Password [email or web address] 01/27/2016, 5:34 PM

## 2016-01-27 NOTE — Care Management Important Message (Signed)
Important Message  Patient Details  Name: Jerry [name] MRN: [id number] Date of Birth: [birth year]   Medicare Important Message Given:  Yes    [name], [name] 01/27/2016, 12:40 PM

## 2016-01-27 NOTE — Progress Notes (Signed)
Daily Progress Note   Patient Name: Jerry [name]       Date: 01/27/2016 DOB: 02-26-1931  Age: 83 y.o. MRN#: [id number] Attending Physician: Florencia Reasons, MD Primary Care Physician: Jerlyn Ly, MD Admit Date: 01/14/2016  Reason for Consultation/Follow-up: Establishing goals of care  83 y.o.malewho presents with acute onset of left facial droop with LUE and LLE weakness. Pt with R MCA stroke and Neurology on board.  Hospital stay has been complicated by dysphagia 2ary to stroke, aspiration, and atrial fibrillation. Overnight, cortrak tube has been removed. Patient has removed his cortrak tube in the past.    Subjective: Met with patient and his wife today. Patient was alert and working with speech therapy at time of encounter.  Appreciate ST continuing to educate on his dysphagia, aspiration, and concept of comfort feeding. He does not move his L side See below:  Length of Stay: 14  Current Medications: Scheduled Meds:  . ampicillin-sulbactam (UNASYN) IV  3 g Intravenous Q8H  . aspirin  300 mg Rectal Daily  . chlorhexidine  15 mL Mouth Rinse BID  . enoxaparin (LOVENOX) injection  40 mg Subcutaneous Q24H  . insulin aspart  0-9 Units Subcutaneous TID WC  . mouth rinse  15 mL Mouth Rinse q12n4p  . metoprolol  5 mg Intravenous Q6H    Continuous Infusions:   PRN Meds: acetaminophen **OR** acetaminophen (TYLENOL) oral liquid 160 mg/5 mL **OR** acetaminophen, hydrALAZINE, levalbuterol, LORazepam, morphine injection  Physical Exam         Not awake not alert Does not follow commands Rapid shallow breathing S1 S2 No edema  Vital Signs: BP (!) 136/96   Pulse 97   Temp (!) 96.7 F (35.9 C) (Oral)   Resp (!) 26   Ht 6' 1" (1.854 m)   Wt 82.3 kg (181 lb 7 oz)   SpO2 92%    BMI 23.94 kg/m  SpO2: SpO2: 92 % O2 Device: O2 Device: Nasal Cannula O2 Flow Rate: O2 Flow Rate (L/min): 2 L/min  Intake/output summary:   Intake/Output Summary (Last 24 hours) at 01/27/16 1037 Last data filed at 01/27/16 0928  Gross per 24 hour  Intake              380 ml  Output  2175 ml  Net            -1795 ml   LBM: Last BM Date: 01/25/16 Baseline Weight: Weight: 87.2 kg (192 lb 5 oz) Most recent weight: Weight: 82.3 kg (181 lb 7 oz)       Palliative Assessment/Data:    Flowsheet Rows   Flowsheet Row Most Recent Value  Intake Tab  Referral Department  Hospitalist  Unit at Time of Referral  Cardiac/Telemetry Unit  Palliative Care Primary Diagnosis  Neurology  Date Notified  01/21/16  Palliative Care Type  Return patient Palliative Care  Reason for referral  Clarify Goals of Care  Date of Admission  02/04/2016  # of days IP prior to Palliative referral  8  Clinical Assessment  Palliative Performance Scale Score  10%  Pain Max last 24 hours  4  Pain Min Last 24 hours  3  Dyspnea Max Last 24 Hours  4  Dyspnea Min Last 24 hours  3  Nausea Max Last 24 Hours  4  Nausea Min Last 24 Hours  3  Anxiety Max Last 24 Hours  4  Anxiety Min Last 24 Hours  3  Psychosocial & Spiritual Assessment  Palliative Care Outcomes  Patient/Family meeting held?  Yes  Who was at the meeting?  wife, church friend who is also an Therapist, sports  Palliative Care Outcomes  Clarified goals of care      Patient Active Problem List   Diagnosis Date Noted  . Goals of care, counseling/discussion   . Encounter for hospice care discussion   . Palliative care encounter   . Aspiration pneumonia of both lower lobes due to gastric secretions ([place])   . Embolic infarction ([place])   . Tachypnea   . Nonverbal   . Somnolence   . Encounter for feeding tube placement   . Respiratory distress   . Acute ischemic right MCA stroke ([place]) 01/14/2016  . CVA (cerebral vascular accident) ([place]) 02/11/2016  .  Hyperlipidemia   . Dyslipidemia   . Essential hypertension   . Hypothyroidism   . ATRIAL FIBRILLATION 12/12/2009  . DIARRHEA 12/12/2009  . PERSONAL HX COLONIC POLYPS 12/12/2009    Palliative Care Assessment & Plan   Patient Profile:    Assessment:  83 y.o.malewho presents with acute onset of left facial droop with LUE and LLE weakness. Pt with R MCA stroke and Neurology on board.  Hospital stay has been complicated by dysphagia 2ary to stroke, aspiration, and atrial fibrillation.  Recommendations/Plan:   Family meeting with wife and a church friend who is also an Therapist, sports on 1/15 with Dr. Rowe Pavy:  Extensive discussions with wife regarding goals of care following an extensive stroke, ongoing issues with aspiration, patient not able to tolerate cortrak tube, he has pulled it out before. Patient with stroke in evolution, last brain imaging with extensive R MCA stroke, also with asp pna, has A fib. Patient was very active prior to the stroke, he has been married to his wife for more than 50 years. He has a son who lives 4-5 hours away. Wife has a good support system in her church friends and neighbors. She is still overwhelmed with the patient's sudden illness and ongoing decline.   PLAN: - No artificial nutrition, no PEG, no re insertion of cortrak.  - Time trial of current therapies for another 24 hours.  He is more alert this AM and working with speech therapy.  - His wife is hopeful because he is more awake  and had a couple spoons of applesauce, but he is going to continue to aspirate regardless of interventions moving forward.  I am not sure that he nor his wife really understand this. - Palliative will continue to follow along and help guide decision making.  She remembers meeting with Dr. Rowe Pavy from our team and discussing hospice/[facility].  She reports this was a "downer" and does not want to discuss further today.  Her friend/congregational nurse, Stephens November, was present at last  palliative meeting as well.  She reports that she will call Ms. Renato Battles to see if she would be able to join her for another meeting tomorrow. - She has a meeting at the bank tomorrow at 10:00.  She will call to set up a follow-up meeting with palliative team tomorrow afternoon.  Hopefully Ms. Renato Battles can join [place] as well.  His wife has specifically stated that her son will not be helpful and she did not want to call him for the meeting.    Code Status:    Code Status Orders        Start     Ordered   01/15/16 0249  Do not attempt resuscitation (DNR)  Continuous    Question Answer Comment  In the event of cardiac or respiratory ARREST Do not call a "code blue"   In the event of cardiac or respiratory ARREST Do not perform Intubation, CPR, defibrillation or ACLS   In the event of cardiac or respiratory ARREST Use medication by any route, position, wound care, and other measures to relive pain and suffering. May use oxygen, suction and manual treatment of airway obstruction as needed for comfort.      01/15/16 0248    Code Status History    Date Active Date Inactive Code Status Order ID Comments User Context   01/12/2016  7:05 AM 01/15/2016  2:48 AM Full Code [id number]  Rise Patience, MD Inpatient    Advance Directive Documentation   Flowsheet Row Most Recent Value  Type of Advance Directive  Living will, Healthcare Power of Attorney  Pre-existing out of facility DNR order (yellow form or pink MOST form)  No data  "MOST" Form in Place?  No data       Prognosis:   guarded   Discharge Planning:  To Be Determined  Care plan was discussed with  Patient, wife, 2 neighbors.   Thank you for allowing the Palliative Medicine Team to assist in the care of this patient.   Time In: 0950 Time Out: 1030 Total Time 40 Prolonged Time Billed  no       Greater than 50%  of this time was spent counseling and coordinating care related to the above assessment and plan.  Micheline Rough,  MD [phone]  Please contact Palliative Medicine Team phone at [phone] for questions and concerns.

## 2016-01-27 NOTE — Progress Notes (Signed)
Speech Language Pathology Treatment: Dysphagia  Patient Details Name: Jerry FewHarold D Lamore MRN: [id number] DOB: 1931/05/09 Today's Date: 01/27/2016 Time: 1000-1044 SLP Time Calculation (min) (ACUTE ONLY): 44 min  Assessment / Plan / Recommendation Clinical Impression  Pt more alert today, SLP repositioned, cleaned mouth. Pts arousal and responsiveness steadily improved through session. Initially pt with no swallow initiation with max verbal and tactile cues, but automaticity improved by end of session. Despite this, pt continues to demonstrate intermittent weak throat clearing and wet vocal quality indicating weakness, residuals and probable aspiration/penetration with puree and ice chips. Pt was able to forceflly clear throat with verbal cues. Provided this information to pts wife. Pt is now capable and alert enough to start a diet with known risk of aspiration.  If pt is going to initiate PO intake, aspiration risk with all textures is unavoidable.  With this clearly verbalized, wife agreeable to trials of ice chips after oral care, encouraged her to give verbal cues for hard throat clearing.  Will f/u tomorrow for further trials to give any new information to palliative care team.    HPI HPI: Pt presents with R Ischemic MCA Infarct. Pt with hx of A-fib, HTN, and Gout.        SLP Plan  Continue with current plan of care     Recommendations  Diet recommendations: NPO;Other(comment) (ice chips after oral care)                General recommendations: Rehab consult Oral Care Recommendations: Oral care QID Follow up Recommendations: Inpatient Rehab Plan: Continue with current plan of care       GO               South Shore Endoscopy Center IncBonnie Reyaansh Merlo, MA CCC-SLP [phone]  Claudine MoutonDeBlois, Theon Sobotka Caroline 01/27/2016, 10:55 AM

## 2016-01-28 LAB — BASIC METABOLIC PANEL
ANION GAP: 12 (ref 5–15)
BUN: 27 mg/dL — ABNORMAL HIGH (ref 6–20)
CHLORIDE: 113 mmol/L — AB (ref 101–111)
CO2: 23 mmol/L (ref 22–32)
CREATININE: 1.16 mg/dL (ref 0.61–1.24)
Calcium: 8.9 mg/dL (ref 8.9–10.3)
GFR calc Af Amer: >60 mL/min (ref >60)
GFR calc non Af Amer: 57 mL/min — ABNORMAL LOW (ref >60)
GLUCOSE: 191 mg/dL — AB (ref 65–99)
Potassium: 4.4 mmol/L (ref 3.5–5.1)
Sodium: 148 mmol/L — ABNORMAL HIGH (ref 135–145)

## 2016-01-28 LAB — CBC
HEMATOCRIT: 49.4 % (ref 39.0–52.0)
HEMOGLOBIN: 16.6 g/dL (ref 13.0–17.0)
MCH: 33.3 pg (ref 26.0–34.0)
MCHC: 33.6 g/dL (ref 30.0–36.0)
MCV: 99.2 fL (ref 78.0–100.0)
Platelets: 403 10*3/uL — ABNORMAL HIGH (ref 150–400)
RBC: 4.98 MIL/uL (ref 4.22–5.81)
RDW: 15 % (ref 11.5–15.5)
WBC: 17.1 10*3/uL — ABNORMAL HIGH (ref 4.0–10.5)

## 2016-01-28 LAB — GLUCOSE, CAPILLARY
GLUCOSE-CAPILLARY: 176 mg/dL — AB (ref 65–99)
Glucose-Capillary: 180 mg/dL — ABNORMAL HIGH (ref 65–99)

## 2016-01-28 LAB — MAGNESIUM: Magnesium: 2.4 mg/dL (ref 1.7–2.4)

## 2016-02-12 NOTE — Discharge Summary (Signed)
Death Summary  Jerry FewHarold D [name] NGE:[id number] DOB: September 10, 1931 DOA: 02/05/2016  PCP: Ezequiel KayserPERINI,MARK A, MD   Admit date: 02/10/2016 Date of Death: 14-Sep-2016  Final Diagnoses:  Principal Problem:   Acute ischemic right MCA stroke (HCC) Active Problems:   ATRIAL FIBRILLATION   Essential hypertension   Hypothyroidism   CVA (cerebral vascular accident) (HCC)   Hyperlipidemia   Embolic infarction (HCC)   Tachypnea   Nonverbal   Somnolence   Encounter for feeding tube placement   Respiratory distress   Aspiration pneumonia of both lower lobes due to gastric secretions (HCC)   Goals of care, counseling/discussion   Encounter for hospice care discussion   Palliative care encounter   History of present illness 83 year old presented after having massive stroke. Subsequently developed aspiration pneumonia and had continued complications.  Hospital Course:  Respiratory failure -Secondary to aspiration pneumonia secondary to massive stroke.  Massive stroke - Contributed to principle problem ultimately leading to death.   Time: 1119  Signed:  Penny PiaVEGA, [place]  [facility] 14-Sep-2016, 11:17 AM

## 2016-02-12 NOTE — Progress Notes (Signed)
Charge RN entered pt chart to page MD for completion of Certificate of Death.  MD paged. Will follow.

## 2016-02-12 NOTE — Progress Notes (Signed)
SLP Cancellation Note  Patient Details Name: Lorna FewHarold D Haught MRN: [id number] DOB: March 17, 1931   Cancelled treatment:       Reason Eval/Treat Not Completed: Medical issues which prohibited therapy. Pt has fully transitioned to comfort care and is not alert or capable of SLP intervention for comfort feeding.    Stormey Wilborn, Riley NearingBonnie Caroline 09-12-16, 11:09 AM

## 2016-02-12 NOTE — Plan of Care (Signed)
Problem: Health Behavior/Discharge Planning: Goal: Ability to manage health-related needs will improve Outcome: Not Progressing Pt appears to be actively dying. Plan is to transition to comfort care.

## 2016-02-12 NOTE — Progress Notes (Signed)
Pt expired at 1114.  Pt spouse was contacted approximately 1 to 1 1/2 hrs prior. Contacted Dr re: death and death certificate. Physician will complete in 1/2 hr. Pt spouse arrived approximately 10-15 min after pt expired.

## 2016-02-12 NOTE — Progress Notes (Signed)
   01/18/2016 1200  Clinical Encounter Type  Visited With Family (Wife)  Visit Type Death  Spiritual Encounters  Spiritual Needs Prayer   Prayed w/ wife and family friends.  - Rev. Chaplain Kipp Broodnthony Deontrey Massi MDiv ThM

## 2016-02-12 DEATH — deceased

## 2018-12-11 IMAGING — CR DG CHEST 1V PORT
1 series · 1 of 1 positions shown · non-contrast
Comparison: 01/18/2016 .

CLINICAL DATA: Pneumonia.

EXAM:
PORTABLE CHEST 1 VIEW

[AP]
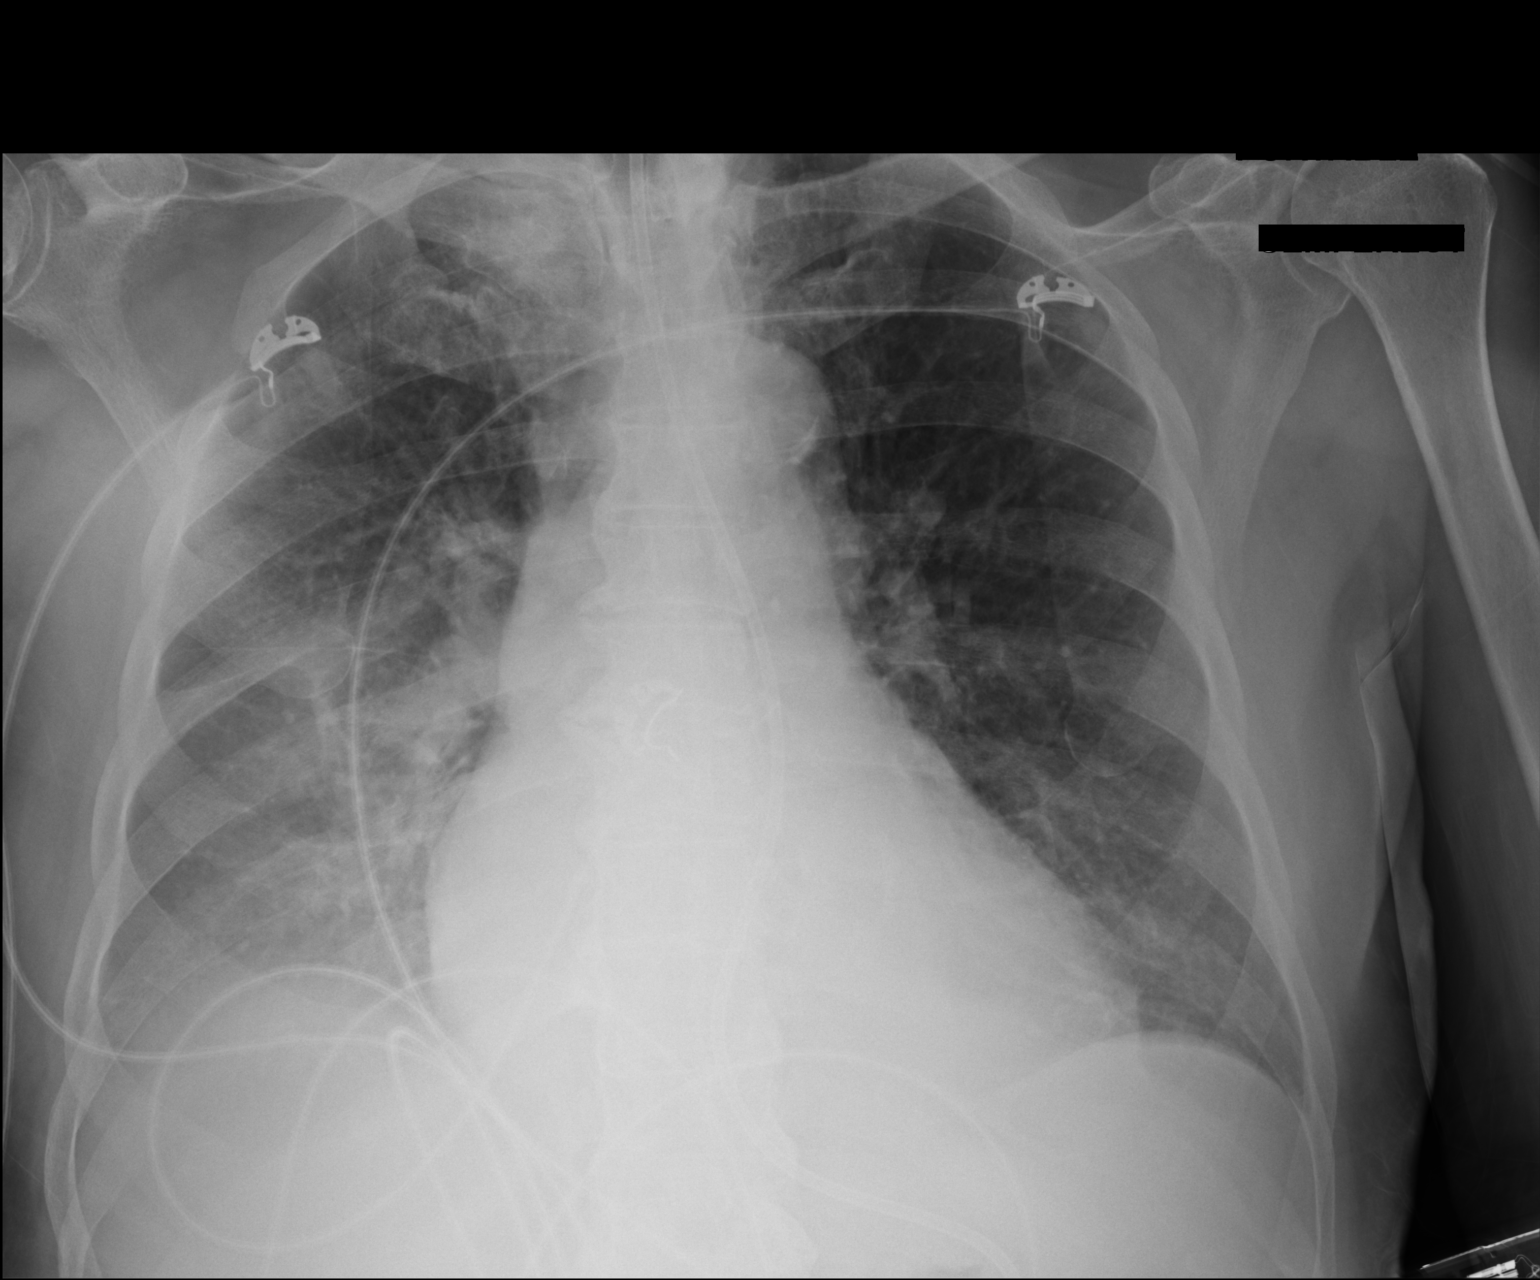

[1 of 1 positions shown; findings below may reference images not displayed]

FINDINGS: Feeding tube noted with tip below left hemidiaphragm. Cardiomegaly
with normal pulmonary vascularity. Diffuse right lung infiltrate
noted consistent with pneumonia. Slight clearing from prior exam.
Left lung is clear on today's exam. No pleural effusion or
pneumothorax .
IMPRESSION: 1.  Feeding tube noted with tip below left hemidiaphragm.

2. Diffuse right lung infiltrate with partial clearing from prior
exam. Findings consistent with pneumonia.

3.  Stable cardiomegaly .

## 2018-12-13 IMAGING — CR DG ABD PORTABLE 1V
1 series · 1 of 1 positions shown · non-contrast
Comparison: 01/23/2016

CLINICAL DATA: Enteric feeding tube placement.

EXAM:
PORTABLE ABDOMEN - 1 VIEW

[AP]
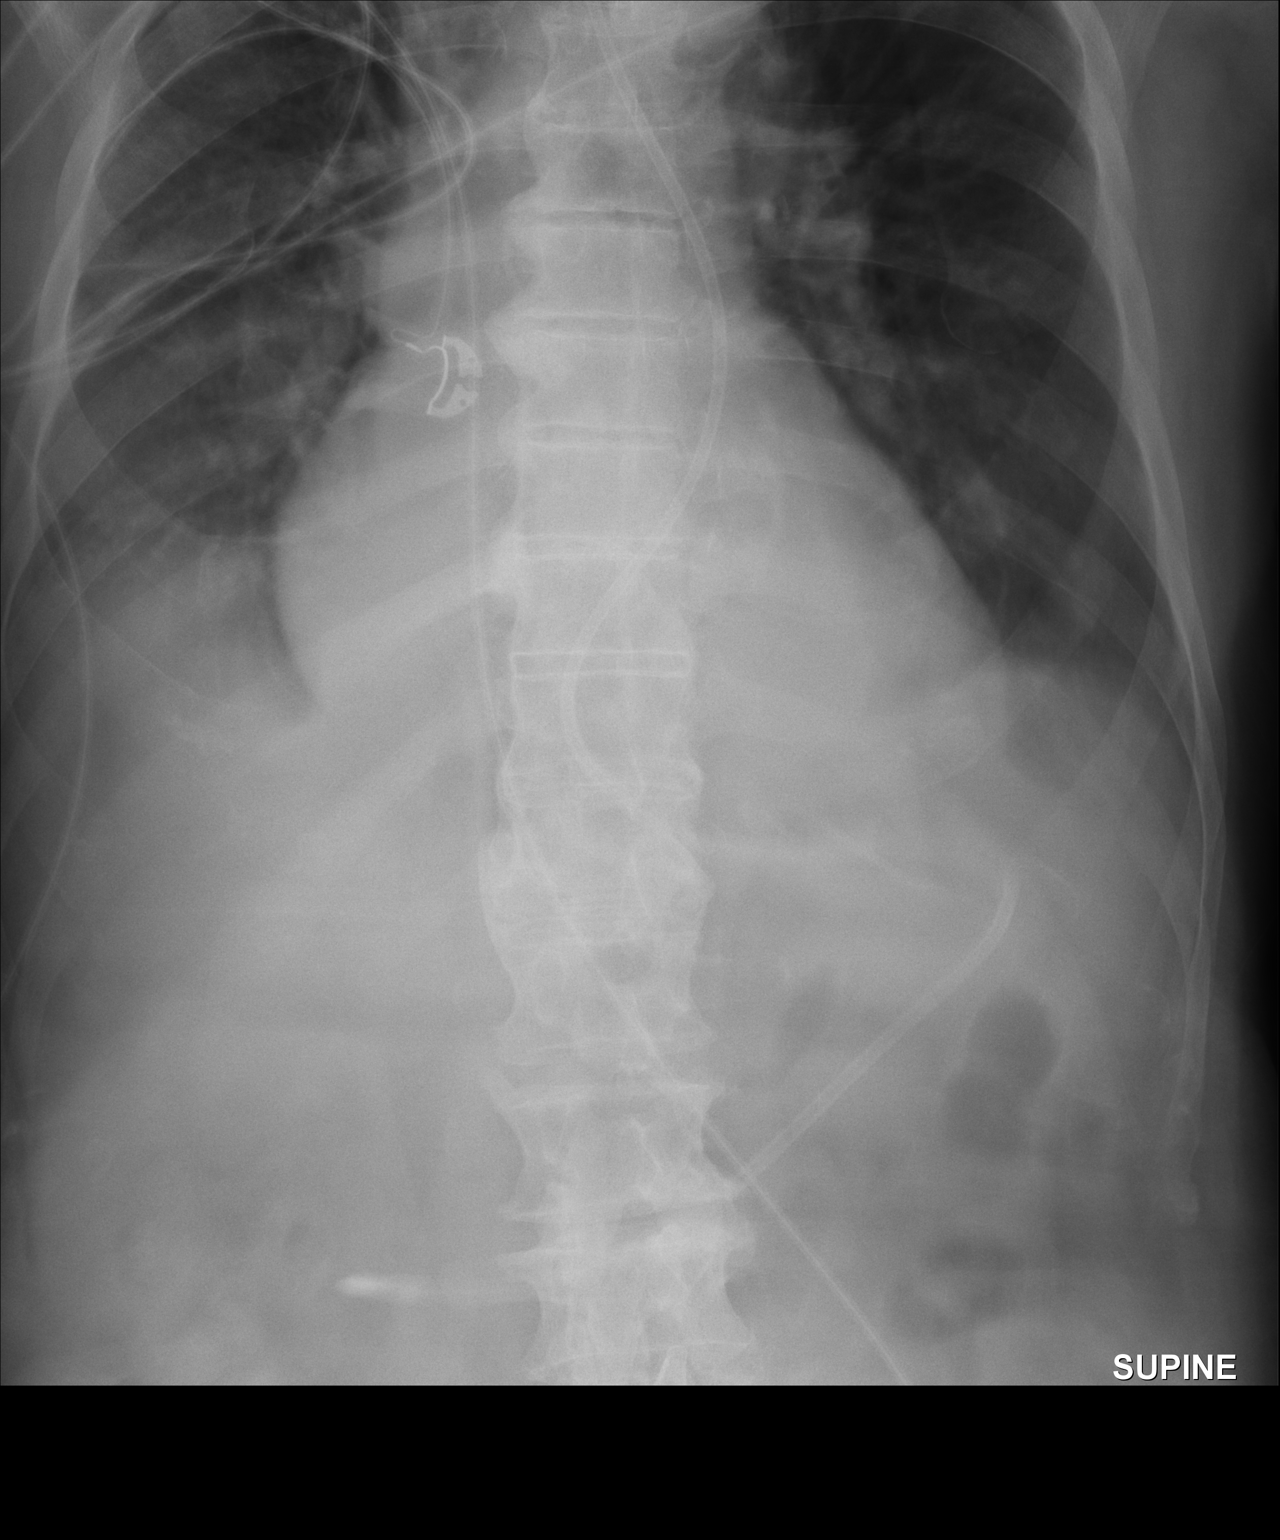

[1 of 1 positions shown; findings below may reference images not displayed]

FINDINGS: Enteric tube passes below the diaphragm. Tip projects in the distal
stomach, possibly within the duodenal bulb.
IMPRESSION: Enteric feeding tube tip projects the distal stomach or possibly the
duodenal bulb.
